# Patient Record
Sex: Male | Born: 1950
Health system: Southern US, Community
[De-identification: ages and names within clinical notes are randomized; demographics above are authoritative.]

## PROBLEM LIST (undated history)

## (undated) DIAGNOSIS — Z950 Presence of cardiac pacemaker: Secondary | ICD-10-CM

## (undated) DIAGNOSIS — K219 Gastro-esophageal reflux disease without esophagitis: Secondary | ICD-10-CM

## (undated) DIAGNOSIS — R55 Syncope and collapse: Secondary | ICD-10-CM

## (undated) DIAGNOSIS — R1310 Dysphagia, unspecified: Secondary | ICD-10-CM

## (undated) DIAGNOSIS — F32A Depression, unspecified: Secondary | ICD-10-CM

## (undated) DIAGNOSIS — S0990XA Unspecified injury of head, initial encounter: Secondary | ICD-10-CM

## (undated) DIAGNOSIS — T7840XA Allergy, unspecified, initial encounter: Secondary | ICD-10-CM

## (undated) DIAGNOSIS — I10 Essential (primary) hypertension: Secondary | ICD-10-CM

## (undated) DIAGNOSIS — M1990 Unspecified osteoarthritis, unspecified site: Secondary | ICD-10-CM

## (undated) DIAGNOSIS — I2510 Atherosclerotic heart disease of native coronary artery without angina pectoris: Secondary | ICD-10-CM

## (undated) DIAGNOSIS — Z45010 Encounter for checking and testing of cardiac pacemaker pulse generator [battery]: Secondary | ICD-10-CM

## (undated) DIAGNOSIS — F419 Anxiety disorder, unspecified: Secondary | ICD-10-CM

## (undated) DIAGNOSIS — N189 Chronic kidney disease, unspecified: Secondary | ICD-10-CM

## (undated) HISTORY — DX: Unspecified osteoarthritis, unspecified site: M19.90

## (undated) HISTORY — DX: Encounter for checking and testing of cardiac pacemaker pulse generator (battery): Z45.010

## (undated) HISTORY — DX: Gastro-esophageal reflux disease without esophagitis: K21.9

## (undated) HISTORY — DX: Anxiety disorder, unspecified: F41.9

## (undated) HISTORY — DX: Depression, unspecified: F32.A

## (undated) HISTORY — PX: SMALL INTESTINE SURGERY: SHX150

## (undated) HISTORY — PX: TOTAL SHOULDER ARTHROPLASTY: SHX126

## (undated) HISTORY — DX: Chronic kidney disease, unspecified: N18.9

## (undated) HISTORY — DX: Allergy, unspecified, initial encounter: T78.40XA

## (undated) HISTORY — PX: APPENDECTOMY: SHX54

## (undated) HISTORY — PX: EYE SURGERY: SHX253

## (undated) HISTORY — DX: Presence of cardiac pacemaker: Z95.0

## (undated) HISTORY — PX: JOINT REPLACEMENT: SHX530

## (undated) HISTORY — PX: ESOPHAGOGASTRODUODENOSCOPY: SHX1529

---

## 1998-06-27 ENCOUNTER — Inpatient Hospital Stay (HOSPITAL_COMMUNITY): Admission: EM | Admit: 1998-06-27 | Discharge: 1998-06-29 | Payer: Self-pay | Admitting: Emergency Medicine

## 1998-06-27 ENCOUNTER — Encounter: Payer: Self-pay | Admitting: Internal Medicine

## 1998-06-28 ENCOUNTER — Encounter: Payer: Self-pay | Admitting: Cardiology

## 1998-08-14 ENCOUNTER — Ambulatory Visit (HOSPITAL_COMMUNITY): Admission: RE | Admit: 1998-08-14 | Discharge: 1998-08-14 | Payer: Self-pay | Admitting: Gastroenterology

## 1998-09-03 ENCOUNTER — Ambulatory Visit (HOSPITAL_COMMUNITY): Admission: RE | Admit: 1998-09-03 | Discharge: 1998-09-03 | Payer: Self-pay | Admitting: Gastroenterology

## 1998-09-03 ENCOUNTER — Encounter: Payer: Self-pay | Admitting: Gastroenterology

## 2000-03-24 ENCOUNTER — Encounter: Admission: RE | Admit: 2000-03-24 | Discharge: 2000-06-22 | Payer: Self-pay | Admitting: Internal Medicine

## 2000-07-07 ENCOUNTER — Emergency Department (HOSPITAL_COMMUNITY): Admission: EM | Admit: 2000-07-07 | Discharge: 2000-07-07 | Payer: Self-pay | Admitting: Emergency Medicine

## 2000-12-14 ENCOUNTER — Encounter: Admission: RE | Admit: 2000-12-14 | Discharge: 2001-03-14 | Payer: Self-pay | Admitting: Internal Medicine

## 2001-01-22 ENCOUNTER — Emergency Department (HOSPITAL_COMMUNITY): Admission: EM | Admit: 2001-01-22 | Discharge: 2001-01-22 | Payer: Self-pay | Admitting: Emergency Medicine

## 2001-02-16 ENCOUNTER — Emergency Department (HOSPITAL_COMMUNITY): Admission: EM | Admit: 2001-02-16 | Discharge: 2001-02-16 | Payer: Self-pay | Admitting: Emergency Medicine

## 2001-02-27 ENCOUNTER — Emergency Department (HOSPITAL_COMMUNITY): Admission: EM | Admit: 2001-02-27 | Discharge: 2001-02-27 | Payer: Self-pay | Admitting: *Deleted

## 2001-06-09 ENCOUNTER — Emergency Department (HOSPITAL_COMMUNITY): Admission: EM | Admit: 2001-06-09 | Discharge: 2001-06-09 | Payer: Self-pay | Admitting: Emergency Medicine

## 2001-06-30 ENCOUNTER — Encounter: Admission: RE | Admit: 2001-06-30 | Discharge: 2001-09-28 | Payer: Self-pay | Admitting: Internal Medicine

## 2002-02-09 ENCOUNTER — Encounter: Admission: RE | Admit: 2002-02-09 | Discharge: 2002-05-10 | Payer: Self-pay | Admitting: Internal Medicine

## 2002-08-07 ENCOUNTER — Emergency Department (HOSPITAL_COMMUNITY): Admission: EM | Admit: 2002-08-07 | Discharge: 2002-08-07 | Payer: Self-pay | Admitting: Emergency Medicine

## 2002-08-19 ENCOUNTER — Emergency Department (HOSPITAL_COMMUNITY): Admission: EM | Admit: 2002-08-19 | Discharge: 2002-08-19 | Payer: Self-pay | Admitting: Plastic Surgery

## 2002-08-19 ENCOUNTER — Encounter: Payer: Self-pay | Admitting: Emergency Medicine

## 2002-09-14 ENCOUNTER — Encounter: Payer: Self-pay | Admitting: Internal Medicine

## 2002-09-14 ENCOUNTER — Ambulatory Visit (HOSPITAL_COMMUNITY): Admission: RE | Admit: 2002-09-14 | Discharge: 2002-09-14 | Payer: Self-pay | Admitting: Internal Medicine

## 2002-09-14 ENCOUNTER — Emergency Department (HOSPITAL_COMMUNITY): Admission: EM | Admit: 2002-09-14 | Discharge: 2002-09-14 | Payer: Self-pay | Admitting: Emergency Medicine

## 2002-12-04 ENCOUNTER — Emergency Department (HOSPITAL_COMMUNITY): Admission: EM | Admit: 2002-12-04 | Discharge: 2002-12-04 | Payer: Self-pay | Admitting: *Deleted

## 2002-12-07 ENCOUNTER — Emergency Department (HOSPITAL_COMMUNITY): Admission: EM | Admit: 2002-12-07 | Discharge: 2002-12-07 | Payer: Self-pay | Admitting: *Deleted

## 2002-12-25 ENCOUNTER — Ambulatory Visit (HOSPITAL_COMMUNITY): Admission: RE | Admit: 2002-12-25 | Discharge: 2002-12-25 | Payer: Self-pay | Admitting: *Deleted

## 2003-01-18 ENCOUNTER — Ambulatory Visit (HOSPITAL_COMMUNITY): Admission: RE | Admit: 2003-01-18 | Discharge: 2003-01-18 | Payer: Self-pay | Admitting: Cardiovascular Disease

## 2003-01-27 HISTORY — PX: PACEMAKER INSERTION: SHX728

## 2003-01-27 HISTORY — PX: OTHER SURGICAL HISTORY: SHX169

## 2003-01-30 ENCOUNTER — Inpatient Hospital Stay (HOSPITAL_COMMUNITY): Admission: EM | Admit: 2003-01-30 | Discharge: 2003-02-02 | Payer: Self-pay | Admitting: Emergency Medicine

## 2003-02-01 DIAGNOSIS — Z950 Presence of cardiac pacemaker: Secondary | ICD-10-CM

## 2003-02-01 HISTORY — DX: Presence of cardiac pacemaker: Z95.0

## 2003-03-10 ENCOUNTER — Emergency Department (HOSPITAL_COMMUNITY): Admission: EM | Admit: 2003-03-10 | Discharge: 2003-03-10 | Payer: Self-pay | Admitting: *Deleted

## 2003-03-24 ENCOUNTER — Emergency Department (HOSPITAL_COMMUNITY): Admission: EM | Admit: 2003-03-24 | Discharge: 2003-03-24 | Payer: Self-pay | Admitting: Emergency Medicine

## 2003-05-20 ENCOUNTER — Emergency Department (HOSPITAL_COMMUNITY): Admission: EM | Admit: 2003-05-20 | Discharge: 2003-05-20 | Payer: Self-pay | Admitting: Emergency Medicine

## 2003-07-24 ENCOUNTER — Emergency Department (HOSPITAL_COMMUNITY): Admission: EM | Admit: 2003-07-24 | Discharge: 2003-07-24 | Payer: Self-pay | Admitting: Emergency Medicine

## 2003-08-24 ENCOUNTER — Emergency Department (HOSPITAL_COMMUNITY): Admission: EM | Admit: 2003-08-24 | Discharge: 2003-08-24 | Payer: Self-pay | Admitting: Emergency Medicine

## 2003-09-17 ENCOUNTER — Emergency Department (HOSPITAL_COMMUNITY): Admission: EM | Admit: 2003-09-17 | Discharge: 2003-09-17 | Payer: Self-pay | Admitting: Emergency Medicine

## 2004-02-14 ENCOUNTER — Ambulatory Visit (HOSPITAL_COMMUNITY): Admission: RE | Admit: 2004-02-14 | Discharge: 2004-02-14 | Payer: Self-pay | Admitting: Gastroenterology

## 2004-03-03 ENCOUNTER — Emergency Department (HOSPITAL_COMMUNITY): Admission: EM | Admit: 2004-03-03 | Discharge: 2004-03-04 | Payer: Self-pay | Admitting: Emergency Medicine

## 2004-04-11 ENCOUNTER — Ambulatory Visit: Payer: Self-pay | Admitting: Hematology & Oncology

## 2004-06-06 ENCOUNTER — Emergency Department (HOSPITAL_COMMUNITY): Admission: EM | Admit: 2004-06-06 | Discharge: 2004-06-06 | Payer: Self-pay | Admitting: Emergency Medicine

## 2004-07-18 ENCOUNTER — Emergency Department (HOSPITAL_COMMUNITY): Admission: EM | Admit: 2004-07-18 | Discharge: 2004-07-18 | Payer: Self-pay | Admitting: Emergency Medicine

## 2004-08-02 ENCOUNTER — Emergency Department (HOSPITAL_COMMUNITY): Admission: EM | Admit: 2004-08-02 | Discharge: 2004-08-02 | Payer: Self-pay | Admitting: Emergency Medicine

## 2005-02-25 ENCOUNTER — Encounter: Admission: RE | Admit: 2005-02-25 | Discharge: 2005-02-25 | Payer: Self-pay | Admitting: Internal Medicine

## 2005-03-28 ENCOUNTER — Emergency Department (HOSPITAL_COMMUNITY): Admission: EM | Admit: 2005-03-28 | Discharge: 2005-03-28 | Payer: Self-pay | Admitting: Emergency Medicine

## 2006-12-14 ENCOUNTER — Emergency Department (HOSPITAL_COMMUNITY): Admission: EM | Admit: 2006-12-14 | Discharge: 2006-12-14 | Payer: Self-pay | Admitting: Emergency Medicine

## 2007-03-02 ENCOUNTER — Ambulatory Visit (HOSPITAL_COMMUNITY): Admission: RE | Admit: 2007-03-02 | Discharge: 2007-03-02 | Payer: Self-pay | Admitting: Neurosurgery

## 2007-10-01 ENCOUNTER — Emergency Department (HOSPITAL_COMMUNITY): Admission: EM | Admit: 2007-10-01 | Discharge: 2007-10-01 | Payer: Self-pay | Admitting: Emergency Medicine

## 2007-10-03 ENCOUNTER — Emergency Department (HOSPITAL_COMMUNITY): Admission: EM | Admit: 2007-10-03 | Discharge: 2007-10-03 | Payer: Self-pay | Admitting: Emergency Medicine

## 2007-12-18 ENCOUNTER — Emergency Department (HOSPITAL_COMMUNITY): Admission: EM | Admit: 2007-12-18 | Discharge: 2007-12-18 | Payer: Self-pay | Admitting: Emergency Medicine

## 2007-12-28 ENCOUNTER — Emergency Department (HOSPITAL_COMMUNITY): Admission: EM | Admit: 2007-12-28 | Discharge: 2007-12-28 | Payer: Self-pay | Admitting: Emergency Medicine

## 2008-02-07 ENCOUNTER — Emergency Department (HOSPITAL_COMMUNITY): Admission: EM | Admit: 2008-02-07 | Discharge: 2008-02-07 | Payer: Self-pay | Admitting: Emergency Medicine

## 2008-10-15 ENCOUNTER — Ambulatory Visit: Payer: Self-pay | Admitting: Psychology

## 2008-12-13 ENCOUNTER — Ambulatory Visit: Payer: Self-pay | Admitting: Psychology

## 2009-08-16 ENCOUNTER — Encounter: Admission: RE | Admit: 2009-08-16 | Discharge: 2009-08-16 | Payer: Self-pay | Admitting: Orthopedic Surgery

## 2009-08-27 ENCOUNTER — Inpatient Hospital Stay (HOSPITAL_COMMUNITY): Admission: RE | Admit: 2009-08-27 | Discharge: 2009-08-29 | Payer: Self-pay | Admitting: Orthopedic Surgery

## 2010-01-23 ENCOUNTER — Encounter
Admission: RE | Admit: 2010-01-23 | Discharge: 2010-01-23 | Payer: Self-pay | Source: Home / Self Care | Attending: Orthopedic Surgery | Admitting: Orthopedic Surgery

## 2010-02-16 ENCOUNTER — Encounter: Payer: Self-pay | Admitting: Neurosurgery

## 2010-04-08 ENCOUNTER — Ambulatory Visit (HOSPITAL_COMMUNITY): Admission: RE | Admit: 2010-04-08 | Payer: Medicare Other | Source: Ambulatory Visit | Admitting: Orthopedic Surgery

## 2010-04-11 LAB — CBC
HCT: 28.7 % — ABNORMAL LOW (ref 39.0–52.0)
MCV: 90.2 fL (ref 78.0–100.0)
Platelets: 135 10*3/uL — ABNORMAL LOW (ref 150–400)
RBC: 3.28 MIL/uL — ABNORMAL LOW (ref 4.22–5.81)
RBC: 3.86 MIL/uL — ABNORMAL LOW (ref 4.22–5.81)
RDW: 13.8 % (ref 11.5–15.5)
RDW: 14 % (ref 11.5–15.5)
WBC: 10.7 10*3/uL — ABNORMAL HIGH (ref 4.0–10.5)
WBC: 9.9 10*3/uL (ref 4.0–10.5)

## 2010-04-11 LAB — BASIC METABOLIC PANEL
BUN: 16 mg/dL (ref 6–23)
BUN: 24 mg/dL — ABNORMAL HIGH (ref 6–23)
Calcium: 8.1 mg/dL — ABNORMAL LOW (ref 8.4–10.5)
Creatinine, Ser: 1.72 mg/dL — ABNORMAL HIGH (ref 0.4–1.5)
GFR calc Af Amer: 49 mL/min — ABNORMAL LOW (ref >60)
GFR calc Af Amer: >60 mL/min (ref >60)
GFR calc non Af Amer: 41 mL/min — ABNORMAL LOW (ref >60)
GFR calc non Af Amer: >60 mL/min (ref >60)
Potassium: 3.8 mEq/L (ref 3.5–5.1)
Sodium: 137 mEq/L (ref 135–145)

## 2010-04-12 LAB — DIFFERENTIAL
Basophils Absolute: 0.1 10*3/uL (ref 0.0–0.1)
Lymphocytes Relative: 21 % (ref 12–46)
Monocytes Absolute: 0.5 10*3/uL (ref 0.1–1.0)
Neutro Abs: 3.7 10*3/uL (ref 1.7–7.7)

## 2010-04-12 LAB — SURGICAL PCR SCREEN
MRSA, PCR: NEGATIVE
Staphylococcus aureus: NEGATIVE

## 2010-04-12 LAB — BASIC METABOLIC PANEL
BUN: 14 mg/dL (ref 6–23)
GFR calc non Af Amer: >60 mL/min (ref >60)
Potassium: 4.4 mEq/L (ref 3.5–5.1)

## 2010-04-12 LAB — URINE MICROSCOPIC-ADD ON

## 2010-04-12 LAB — URINALYSIS, ROUTINE W REFLEX MICROSCOPIC
Bilirubin Urine: NEGATIVE
Hgb urine dipstick: NEGATIVE
Ketones, ur: NEGATIVE mg/dL
Protein, ur: NEGATIVE mg/dL
Specific Gravity, Urine: 1.017 (ref 1.005–1.030)
Urobilinogen, UA: 0.2 mg/dL (ref 0.0–1.0)

## 2010-04-12 LAB — CBC
HCT: 44.7 % (ref 39.0–52.0)
Platelets: 164 10*3/uL (ref 150–400)
RDW: 14.1 % (ref 11.5–15.5)
WBC: 5.5 10*3/uL (ref 4.0–10.5)

## 2010-04-12 LAB — TYPE AND SCREEN: ABO/RH(D): O POS

## 2010-04-12 LAB — PROTIME-INR
INR: 0.89 (ref 0.00–1.49)
Prothrombin Time: 12 seconds (ref 11.6–15.2)

## 2010-04-12 LAB — APTT: aPTT: 45 seconds — ABNORMAL HIGH (ref 24–37)

## 2010-05-12 LAB — URINALYSIS, ROUTINE W REFLEX MICROSCOPIC
Bilirubin Urine: NEGATIVE
Glucose, UA: NEGATIVE mg/dL
Ketones, ur: NEGATIVE mg/dL
pH: 6 (ref 5.0–8.0)

## 2010-05-12 LAB — DIFFERENTIAL
Basophils Relative: 1 % (ref 0–1)
Monocytes Relative: 7 % (ref 3–12)
Neutro Abs: 5.2 10*3/uL (ref 1.7–7.7)
Neutrophils Relative %: 74 % (ref 43–77)

## 2010-05-12 LAB — COMPREHENSIVE METABOLIC PANEL
Alkaline Phosphatase: 81 U/L (ref 39–117)
BUN: 7 mg/dL (ref 6–23)
CO2: 29 mEq/L (ref 19–32)
Calcium: 9.3 mg/dL (ref 8.4–10.5)
GFR calc non Af Amer: >60 mL/min (ref >60)
Glucose, Bld: 98 mg/dL (ref 70–99)
Potassium: 3.5 mEq/L (ref 3.5–5.1)
Total Protein: 6.9 g/dL (ref 6.0–8.3)

## 2010-05-12 LAB — CBC
HCT: 46.8 % (ref 39.0–52.0)
Hemoglobin: 15.4 g/dL (ref 13.0–17.0)
MCHC: 32.8 g/dL (ref 30.0–36.0)
RDW: 14.1 % (ref 11.5–15.5)

## 2010-05-12 LAB — LIPASE, BLOOD: Lipase: 21 U/L (ref 11–59)

## 2010-05-12 LAB — RPR: RPR Ser Ql: NONREACTIVE

## 2010-06-13 NOTE — Op Note (Signed)
NAMEBRADON, FESTER                        ACCOUNT NO.:  [id number]   MEDICAL RECORD NO.:  [id number]                   PATIENT TYPE:  OIB   LOCATION:  2860                                 FACILITY:  [facility]   PHYSICIAN:  Richard A. Alanda Amass, M.D.          DATE OF BIRTH:  [birth year]   DATE OF PROCEDURE:  01/18/2003  DATE OF DISCHARGE:  01/18/2003                                 OPERATIVE REPORT   PREOPERATIVE DIAGNOSIS:  1. Recurrent syncope, etiology unknown, negative neurologic evaluation,     negative tilt table testing December 25, 2002, negative Holter monitor.  2. Occupational head injury 1998, subsequent migraine headaches.  3. Remote seizure disorder, off medication now (Depakote).  4. Systemic hypertension, left ventricular hypertrophy.   POSTOPERATIVE DIAGNOSIS:  1. Recurrent syncope, etiology unknown, negative neurologic evaluation,     negative tilt table testing December 25, 2002, negative Holter monitor.  2. Occupational head injury 1998, subsequent migraine headaches.  3. Remote seizure disorder, off medication now (Depakote).  4. Systemic hypertension, left ventricular hypertrophy.   PROCEDURE:  Implantation of Medtronic loop recorder, Reveal-Plus model  [id number]; serial number [id number].   PREMEDICATION:  5 mg Valium p.o. premedication, 2 mg of morphine  premedication, 1 gm of Ancef IV antibiotic prophylaxis, 2 mg of Versed and 2  mg of morphine IV, 1% local Xylocaine.   ESTIMATED BLOOD LOSS:  Approximately 10 mL.   COMPLICATIONS:  None.   CURRENT MEDICATIONS:  1. Effexor 150.  2. Risperdal 2 mg.  3. Ultracet 2 q.6h. 37.5/325.  4. Actiq 600 micrograms p.r.n.  5. Lotrel 5/10 2 every day.  6. Atenolol 50.  7. Clonidine 0.2.  8. Clonazepam 1 mg every day.  9. Amitriptyline 25.  10.      Duragesic patch 2 mg every day.  11.      Baby aspirin 1 every day.   INDICATIONS FOR PROCEDURE:  He was referred for loop recorder implantation  because of unexplained  syncope with history as outlined above. He was  brought in as an outpatient, coags and preoperative laboratory  were normal.   DESCRIPTION OF PROCEDURE:  He was brought to the 6th floor CP laboratory,  room #6. The left anterior chest was prepped and draped in the usual manner  and 1% Xylocaine was used for local anesthesia. He was given intermittent  morphine  and Versed for sedation.   The mid left chest, 3rd to 4th intercostal space, parasternal, a short  transverse incision was performed. This was brought down through the  subcutaneous tissue. A pocket was formed using blunt dissection and  electrocautery to control hemostasis. The Reveal loop recorder was inserted  into the pocket and tacked down two #1 silk sutures in the superior aspect  just below the incision. The recorder fit well with good subcutaneous  tissue.   The sponge count was correct. The subcutaneous tissue was closed with 2  short running layers of  2-0 Dexon suture. The skin was closed with 5-0  subcuticular Dexon suture  and Steri-Strips were applied.   The patient tolerated the procedure well. He was transferred to the holding  area for  programming of his Reveal Plus programmable loop recorder and we  plan to discharge  the patient later today  if stable.                                               Richard A. Alanda Amass, M.D.    RAW/MEDQ  D:  01/18/2003  T:  01/20/2003  Job:  [id number]   cc:   Merlene Laughter. [name], M.D.  [address]  [address]  [place]  [place] [address]  Fax: [phone]   CP Lab

## 2010-06-13 NOTE — Op Note (Signed)
NAMEMARDELL, Aaron                        ACCOUNT NO.:  [id number]   MEDICAL RECORD NO.:  [id number]                   PATIENT TYPE:  INP   LOCATION:  2011                                 FACILITY:  [facility]   PHYSICIAN:  Richard A. Alanda Amass, M.D.          DATE OF BIRTH:  [birth year]   DATE OF PROCEDURE:  DATE OF DISCHARGE:                                 OPERATIVE REPORT   PROCEDURE:  1. Explantation of Reveal Plus Medtronic [id number] implantable loop recorder SN:     [id number].  2. Implantation of new DDDR Medtronic pulse generator Enpulse model #[id number];     SN:  [id number] with passive fixation __________ atrial and ventricular     bipolar Medtronic steroid eluding electrodes.   IMPLANTING PHYSICIAN:  Richard A. Alanda Amass, M.D.   COMPLICATIONS:  None.   ESTIMATED BLOOD LOSS:  Approximately 50 mL.   ANESTHESIA:  5 mg of Valium p.o. premedication, intermittent morphine  sulfate total of 6 mg IV, Zofran 4 mg IV.   PREOPERATIVE DIAGNOSES:  1. Recurrent syncope and presyncope.  2. Documented 7-9 second pauses on loop recorder and telemetry, sinus node     dysfunction.  3. Severe hypertension medically resistant.  4. Normal coronary arteries and left ventricle on prior catheterization     February 01, 2003.  5. Occupational head injury in 1998, subsequent migraine headaches, remote     seizures, off seizure medication now.  6. Recent negative neurologic workup.  7. Mild fasting glucose elevation, possible diabetes.   ATRIAL ELECTRODE:  Medtronic [id number]; SN:  [id number].   VENTRICULAR ELECTRODE:  Medtronic [id number]; SN:  [id number].   THRESHOLDS:  Atrial:  Unipolar:  Threshold for capture 0.2V,  resistance 382  ohms,  P wave measured 5.0 MV.   Bipolar atrial:  Threshold for capture 0.8V, resistance 499 ohms, P wave  measured 4.1 MV.   Ventricular unipolar:  Threshold 0.1V, impedance 458 ohms, R wave 8.3 MV.   Bipolar ventricular:  Threshold 0.3V, resistance 610 ohms, R  wave 9.0 MV.   DESCRIPTION OF PROCEDURE:  The patient was brought to the second floor CP  lab in the post absorptive state. He had previous coronary angiography and  temporary transvenous pacemaker was placed at that time along with an  arterial sheath to monitor blood pressures.  Because of significant  hypertension, he was placed on IV labetalol drip after getting a total of 30  mg IV labetalol x3.  It required labetalol drip of 2 mg per minute and IV  nitroglycerin of 5 mcg per minute for blood pressure control during the  procedure.  He had some mild nausea which was treated with 4 mg of Zofran  IV.  He had a total of 6 mg of morphine sulfate in divided doses during the  procedure for anesthesia.   The right anterior chest was prepped and draped in the usual manner, 1%  Xylocaine was used for local anesthesia.  A right infraclavicular  curvilinear transverse incision was performed and brought down through the  firm subcutaneous tissue to the prepectoral fascia. A post generator pocket  was formed using blunt dissection, electrocautery was used to control  hemostasis.  The patient had temporary pacing going which was intermittently  packing in backup.   The subclavian vein was entered with a single anterior puncture using 18  thin walled needle and two tandem #9 peel-away Cook introducers were  introduced with retained guidewire in tandem and the electrodes inserted per  standard fashion. Ventricular electrode was positioned below the tricuspid  valve in the right ventricle and was stable. There was some ectopy on  positioning the RV electrode and this was the quietest position and had  excellent thresholds and R waves.  Atrial electrode was positioned in the  right atrial appendage confirmed by fluoroscopy and rotational maneuvers.  Unipolar and bipolar threshold testing was done, there was no diaphragmatic  stimulation on the atrial or ventricular leads with 10 volt output.  The   electrodes were secured at the insertion site with the previously placed #1  figure-of-eight silk suture after the retained guidewire was removed.  This  was to prevent migration and control hemostasis.  They were further secured  with two interrupted #1 silk sutures around a silicon collar for each  electrode.  The pocket was irrigated with 500 mg of kanamycin solution.  The  generator was delivered into the pocket with the electrodes looped behind,  the sponge count was correct. The generator was loosely secured to the  underlying muscle and fascia with #1 silk suture to prevent migration.  The  subcutaneous tissue was closed with two separate running layers of 2-0 Dexon  suture and the skin was closed with 5-0 subcuticular Dexon suture and Steri-  Strips were applied.  We then turned our attention to the patient's  previously implant loop recorder in the left parasternal region. This area  had been previously prepped and draped accordingly.  One percent Xylocaine  was used for local anesthesia.  A short transverse 1 1/2 to 2 cm incision  was performed just below the previous incision at the superior aspect of the  loop recorder. Blunt dissection was used to open the subcutaneous tissue.  The two previously placed sutures were removed in total and the loop  recorder was delivered from the pocket. The pocket was irrigated with  sterile saline solution and hemostasis was controlled with electrocautery.  The pocket was primarily closed with two short separate running layers of 2-  0 Dexon suture for the subcutaneous tissue plane and the skin was closed  with 5-0 subcuticular Dexon suture and Steri-Strips were applied. The  patient tolerated this quite well.  At the end of the procedure, he was on 2  mg a minute of labetalol and IV nitroglycerin was discontinued.  His systolic blood pressure was 140 and 150 mmHg.  He tolerated the procedure  well and was transferred to the holding area for  postoperative care and  reprogramming.   PREOPERATIVE DIAGNOSES:  1. Recurrent syncope with documented sinus arrest and sinus pauses 7-9     seconds on telemetry and loop recorder monitoring.  2. Previously placed loop recorder January 18, 2003.  3. Systemic hypertension medically resistant.  4. Normal renal arteries.  5. Normal coronary arteries and left ventricle February 01, 2003.  6. Remote occupational head injury, subsequent seizures, none recent.     History of chronic migraine headaches.  Magnet rate of BOL equals 85, at ERA magnet rate drops down to 65 per  minute.                                               Richard A. Alanda Amass, M.D.    RAW/MEDQ  D:  02/01/2003  T:  02/01/2003  Job:  [id number]   cc:   Nanetta Batty, M.D.  Fax: [phone]   Merlene Laughter. [name], M.D.  [address]  [address]  [place]  [place] [address]  Fax: [phone]

## 2010-06-13 NOTE — Cardiovascular Report (Signed)
Aaron Snyder, Aaron Snyder                        ACCOUNT NO.:  [id number]   MEDICAL RECORD NO.:  [id number]                   PATIENT TYPE:  INP   LOCATION:  2011                                 FACILITY:  [facility]   PHYSICIAN:  Richard A. Alanda Amass, M.D.          DATE OF BIRTH:  November 15, 1950   DATE OF PROCEDURE:  02/01/2003  DATE OF DISCHARGE:                              CARDIAC CATHETERIZATION   PROCEDURE:  1. Retrograde central aortic catheterization.  2. Coronary angiography by Judkins technique.  3. Left ventricular angiogram RAO projection.  4. Abdominal aortic angiogram mid stream PA projection.   DESCRIPTION OF PROCEDURE:  The patient was brought to the second floor CP  Laboratory in the postabsorptive state after 5 mg Valium p.o. premedication.  Because of the history of possible dye allergy he was premedicated  preoperatively with 25 IV Benadryl, 125 IV Solu-Medrol, and 20 mg IV Pepcid  preoperatively.  He had no dye reactions at all during the procedure.  The  patient's CFA and the CFE were both entered with anterior punctures using an  18 thin wall needle and 6-French venous and arterial sheaths were inserted  without difficulty.  Temporary transvenous pacemaker was inserted because of  the patient's history of syncope with sinus arrest and seven to nine second  recurrent pauses.  Temporary transvenous pacemaker was passed to the RV and  temporary transvenous pacing was put on back-up.  The patient remained in  sinus rhythm throughout the procedure.  Diagnostic coronary angiography was  done with a 6-French 4 cm taper, preformed Cordis coronary, and pigtail  catheters using Omnipaque dye throughout the procedure.  The patient was  given 2 mg of morphine during the procedure for sedation.  LV angiogram in  the RAO projection with 25 mL 14 mL/second.  Pullback pressure of the CA  showed no gradient across the aortic valve.  Abdominal aortic angiogram done  above the level of the  renal arteries at 25 mL 20 mL/second with  visualization of the distal iliacs and SFA profunda junction bilaterally.  Catheters removed.  Sidearm sheath was flushed.  The patient tolerated the  diagnostic procedure well.   PRESSURES:  LV:  195/0.  LVEDP 18 mmHg.  CA:  195/100, mean 138 mmHg.  There  was no gradient across the aortic valve on catheter pullback.  Fluoroscopy  showed the previously placed left parasternal loop recorder in position.  There was no significant coronary, intracardiac, or valvular calcification.   LEFT VENTRICULOGRAM:  LV angiogram demonstrated the normally contracting  left ventricle with angiographic LVH, EF greater than 55% with no MR  present.   ANGIOGRAPHIC DATA:  The main left coronary was long and normal.   The LAD was mildly tortuous, coursed to the apex of the heart where it  bifurcated and was normal throughout its course.   There was normal small to moderate DX1 before SP1 and a normal DX2 after SP1  to junction of the proximal third of the LAD.  There was small diagonals  arising from the mid and distal LAD that were normal.   The circumflex artery was widely patent and smooth.  However, there was  tortuosity and mild systolic approximately 30% narrowing at the junction of  the proximal third with systole.  There was good flow throughout.  The  circumflex bifurcated distally.  There was a large PABG branch and large  distal bifurcating marginal branch.   The right coronary artery had smooth 30% mild eccentric narrowing at the  junction of the proximal third.  The large RV branch in the mid portion  bifurcated and was normal.  The PDA and PLA were normal with the PDA arising  near the acute margin.   DISCUSSION:  This 60 year old widowed father of two with two grandchildren  is currently disabled.  His daughter lives with this.  He quit smoking in  1998.  Has no drug abuse and has a history of significant medically  resistant hypertension and  recurrent syncope.   The patient had an occupational head injury in 1998, subsequent migraine  headaches and remote seizure disorder, off medication at present.  Because  of recurrent frank syncopal along with presyncopal episodes with negative  neurologic evaluation including CT, MRI, and EEG, he underwent loop recorder  implantation on January 18, 2003 in the left parasternal position which was  uncomplicated.   He has had recurrent syncope and was seen back in the office on January 4,  60 2005 and found to have a long sinus pause of up to seven seconds along with  intermittent sinus arrest for three to four seconds.  His medications were  held including beta blockers.  He was admitted to the hospital and placed on  telemetry and he had a recurrent long sinus pause of approximately seven  seconds with presyncope.  He was referred for further evaluation and  consideration for pacemaker therapy.  He has a history of hypertension,  atypical chest pain, and had remote normal coronary arteries.   Coronary arteries show normal LV.  Systemic hypertension.  Abdominal aortic  angiogram is normal with normal single renal arteries bilaterally and normal  SMA, celiac, IMA vessels proximally, and normal iliacs.   There is no significant right coronary disease or AV node arterial supply to  suggest an ischemic etiology to his syncope.  It is recommended that the  patient undergo permanent pacemaker implantation in this setting and  reinstitution of medical therapy for his hypertension.   CATHETERIZATION DIAGNOSES:  1. Essentially normal coronary arteries and left ventricle.  2. Systemic hypertension, poorly controlled.  Medications on hold at present     because of recurrent syncope.  3. Normal renal arteries.  4. Angiographic left ventricular hypertrophy with normal systolic function.  5. Recurrent syncope and presyncope with documented sinus pauses. 6. Remote occupational head injury,  subsequent seizures and chronic migraine     headaches treated medically.  7. History of dye allergy.  No problems with premedication on this study.                                               Richard A. Alanda Amass, M.D.    RAW/MEDQ  D:  02/01/2003  T:  02/01/2003  Job:  [id number]   cc:   CP Lab  Merlene Laughter. [name], M.D.  [address]  [address]  [place]  [place] [address]  Fax: [phone]

## 2010-06-13 NOTE — Discharge Summary (Signed)
[name], [name]                        ACCOUNT NO.:  [id number]   MEDICAL RECORD NO.:  [id number]                   PATIENT TYPE:  INP   LOCATION:  2011                                 FACILITY:  [facility]   PHYSICIAN:  Richard A. Alanda Amass, M.D.          DATE OF BIRTH:  06-13-1950   DATE OF ADMISSION:  01/30/2003  DATE OF DISCHARGE:  02/02/2003                                 DISCHARGE SUMMARY   [name] is a 60 year old patient of Dr. Nanetta Batty and Dr.  Susa Griffins who was admitted January 30, 2003, secondary to syncope and  near syncope.  He had a prior history of this and actually had a loop  recorder put in.  He came to the office on January 30, 2003, with complaints  of syncope on New Years and feeling presyncopal on the day of admission.  His interrogation revealed significant bradycardia with long pauses and some  junctional rhythms.  Thus he was admitted, some of his medications were held  such as his atenolol and clonidine.  That day he had a 9.7 second pause on  the monitor and he momentarily passed out.  He apparently continued to have  pauses while hospitalized.  Thus it was decided that he should undergo  permanent pacemaker implantation.  This was performed on February 01, 2003, by  Dr. Susa Griffins.  His Reveal Plus loop recorder was explanted and then  he had a dual-chamber pacemaker implanted.  It was a Medtronic EnPulse,  serial number [id number].  It was placed in a DDDR mode.  He also underwent  cardiac catheterization.  He was found to have only a 30% blockage of his  circumflex and 30% of his RCA.  His LAD and diagonals were clean.  He was  very hypertensive during his procedures, thus he was put on labetalol drip.  This was discontinued that same day and he was placed back on atenolol.  The  day of discharge his blood pressures ranged from 96/50 to 152/65, his heart  rate was in the 60s to 70s, and O2 saturations were 93-96%.  Labs:   Sodium  135, potassium 4.2, BUN 14, creatinine 1.2, glucose 203.  His hemoglobin was  12.0, hematocrit 35.6, WBC 14.6, and platelets 218.  His discharge today is  pending his chest x-ray reports and him receiving his antibiotics post-  implantation.   Medications at discharge are:  1. Effexor XR 150 mg once per day.  2. Klonopin 1 mg once per day.  3. Norvasc 10 mg once per day.  4. Lotrel 10/20 once per day.  5. Elavil 75 mg at bedtime.  6. Risperdal 2 mg once per day.  7. Atenolol 50 mg twice per day.  8. Enteric-coated aspirin 81 mg once per day.  9. Fentanyl patch 50 mcg, he changes that every two days.   Activity is no lifting, pushing or pulling with right arm.  No  driving until  seen in the office.  No stretching with his right arm.   He should be on a low-salt 2000-mg or less diet, low saturated fat.  He  should eat lots of vegetables, fruits, and whole grains.   He should look at his pacer site every day.  If it looks like it is swelling  a lot, he will give [place] a call.  If the bruising becomes extreme or if he  sees any drainage, he will give [place] a call.  He is to keep his pacer site dry  x4 days and he should was with soap and water gently and pat dry.  He will  follow up with Corine Shelter, P.A., on February 12, 2003, at 10:30.  He will  then be scheduled to see Dr. Alanda Amass some time in February.   DISCHARGE DIAGNOSES:  1. Syncope secondary to long pauses, also had some junctional rhythm.  2. Status post __________ placed with a DDDR Medtronic pacer.  3. Hypertension.  4. History of head injury.  5. Chronic pain.  6. Very mild coronary artery disease.  7. Intermittent elevated glucose while in the hospital.      [name], N.P.                        Richard A. Alanda Amass, M.D.    BB/MEDQ  D:  02/02/2003  T:  02/03/2003  Job:  [id number]   cc:   Merlene Laughter. [name], M.D.  [address]  [address]  [place]  [place] [address]  Fax: [phone]   Nanetta Batty,  M.D.  Fax: [phone]

## 2010-06-13 NOTE — Cardiovascular Report (Signed)
NAMEAIMEE, Aaron Snyder                        ACCOUNT NO.:  [id number]   MEDICAL RECORD NO.:  [id number]                   PATIENT TYPE:  OIB   LOCATION:  2891                                 FACILITY:  [facility]   PHYSICIAN:  Darlin Priestly, M.D.             DATE OF BIRTH:  19-Sep-1950   DATE OF PROCEDURE:  12/25/2002  DATE OF DISCHARGE:                              CARDIAC CATHETERIZATION   PROCEDURES PERFORMED:  Heads up tilt table testing with isopril infusion.   ATTENDING:  Darlin Priestly, M.D.   COMPLICATIONS:  None.   INDICATIONS:  Mr. [name] is a 60 year old male patient of Cala Bradford R.  [name], M.D. and Nanetta Batty, M.D. with a history of a head injury in  1998, history of remote tobacco use, hypertension who recently has had  recurrent unexplained syncope with a negative neurologic evaluation.  He is  now referred for tilt table testing to rule out neurocardiogenic syncope.   DESCRIPTION OF OPERATION:  After giving informed written consent, patient  brought to the cardiac catheterization laboratory in a fasting state.  The  patient was then placed in a supine position and hemodynamic measurements  were obtained.  Resting blood pressure 177/90.  Resting heart rate 75.  He  was monitored for approximately five minutes, then tilted to the 70 degree  heads up position for approximately 30 minutes.  He did have some  intermittent palpitations, but no significant change in his heart rate or  blood pressure.  He was returned to the supine position after 30 minutes and  was begun on isopril infusion initially at 7.5 and ultimately titrated to  37.5 mL.  He did have a peak heart rate of approximately 126, but no  significant change in his blood pressure.  After approximately 10 minutes he  was then tilted to a 70 degree heads up position again for 10 minutes and  though he complained of some mild room spinning, he had no syncope or  presyncope.  Isopril was then discontinued.   He was returned to supine  position.  Blood pressure was again monitored approximately 10 minutes.  He  remained completely asymptomatic.  He was then transferred to recovery room  in stable condition.   CONCLUSION:  Negative heads up tilt table testing with isopril infusion.                                               Darlin Priestly, M.D.    RHM/MEDQ  D:  12/25/2002  T:  12/25/2002  Job:  [id number]   cc:   Merlene Laughter. [name], M.D.  [address]  [address]  [place]  [place] [address]  Fax: [phone]   Nanetta Batty, M.D.  Fax: [phone]

## 2010-10-28 LAB — CBC
HCT: 47
Hemoglobin: 15.7
MCHC: 33.4
MCV: 90.4
Platelets: 217
RDW: 14

## 2010-10-28 LAB — URINALYSIS, ROUTINE W REFLEX MICROSCOPIC
Bilirubin Urine: NEGATIVE
Glucose, UA: NEGATIVE
Hgb urine dipstick: NEGATIVE
Ketones, ur: NEGATIVE
Protein, ur: NEGATIVE
Urobilinogen, UA: 0.2

## 2010-10-28 LAB — DIFFERENTIAL
Basophils Absolute: 0
Basophils Relative: 1
Lymphocytes Relative: 23
Monocytes Absolute: 0.4
Monocytes Relative: 8
Neutro Abs: 3.4
Neutrophils Relative %: 66

## 2010-10-28 LAB — COMPREHENSIVE METABOLIC PANEL
Albumin: 3.8
BUN: 13
Creatinine, Ser: 0.94
Glucose, Bld: 114 — ABNORMAL HIGH
Total Protein: 6.7

## 2010-10-28 LAB — GLUCOSE, CAPILLARY: Glucose-Capillary: 107 — ABNORMAL HIGH

## 2010-10-28 LAB — OCCULT BLOOD X 1 CARD TO LAB, STOOL: Fecal Occult Bld: NEGATIVE

## 2010-10-29 LAB — DIFFERENTIAL
Basophils Absolute: 0
Basophils Relative: 1
Eosinophils Absolute: 0.1
Eosinophils Relative: 1
Eosinophils Relative: 1
Lymphocytes Relative: 16
Lymphocytes Relative: 19
Lymphs Abs: 1.2
Monocytes Absolute: 0.6
Monocytes Absolute: 0.6
Monocytes Relative: 8
Monocytes Relative: 9
Neutro Abs: 4.3
Neutro Abs: 5.4
Neutrophils Relative %: 75

## 2010-10-29 LAB — COMPREHENSIVE METABOLIC PANEL
ALT: 22
AST: 34
Albumin: 4.3
CO2: 29
Calcium: 10
GFR calc Af Amer: >60
Sodium: 139
Total Protein: 7.5

## 2010-10-29 LAB — URINALYSIS, ROUTINE W REFLEX MICROSCOPIC
Bilirubin Urine: NEGATIVE
Bilirubin Urine: NEGATIVE
Glucose, UA: NEGATIVE
Glucose, UA: NEGATIVE
Hgb urine dipstick: NEGATIVE
Hgb urine dipstick: NEGATIVE
Ketones, ur: NEGATIVE
Ketones, ur: NEGATIVE
Nitrite: NEGATIVE
Nitrite: NEGATIVE
Protein, ur: NEGATIVE
Protein, ur: NEGATIVE
Specific Gravity, Urine: 1.001 — ABNORMAL LOW
Specific Gravity, Urine: 1.017
Urobilinogen, UA: 0.2
Urobilinogen, UA: 1
pH: 6.5
pH: 7

## 2010-10-29 LAB — URINE MICROSCOPIC-ADD ON

## 2010-10-29 LAB — POCT I-STAT, CHEM 8
BUN: 15
Calcium, Ion: 1.13
Chloride: 103
Creatinine, Ser: 1.1
Glucose, Bld: 107 — ABNORMAL HIGH
HCT: 52
Hemoglobin: 17.7 — ABNORMAL HIGH
Potassium: 4
Sodium: 137
TCO2: 30

## 2010-10-29 LAB — BASIC METABOLIC PANEL
CO2: 31
Calcium: 9.7
GFR calc Af Amer: >60
GFR calc non Af Amer: >60
Glucose, Bld: 113 — ABNORMAL HIGH
Potassium: 3.9
Sodium: 140

## 2010-10-29 LAB — LIPASE, BLOOD: Lipase: 23

## 2010-10-29 LAB — CBC
HCT: 49.8
Hemoglobin: 16.5
MCHC: 33.2
RBC: 5.46
RBC: 5.55
RDW: 14
RDW: 14.1

## 2010-10-29 LAB — URINE CULTURE
Colony Count: NO GROWTH
Culture: NO GROWTH

## 2010-10-29 LAB — OCCULT BLOOD X 1 CARD TO LAB, STOOL: Fecal Occult Bld: NEGATIVE

## 2010-10-31 LAB — COMPREHENSIVE METABOLIC PANEL
AST: 23 U/L (ref 0–37)
BUN: 9 mg/dL (ref 6–23)
CO2: 31 mEq/L (ref 19–32)
Calcium: 9.2 mg/dL (ref 8.4–10.5)
Creatinine, Ser: 1.04 mg/dL (ref 0.4–1.5)
GFR calc Af Amer: >60 mL/min (ref >60)
GFR calc non Af Amer: >60 mL/min (ref >60)

## 2010-10-31 LAB — URINALYSIS, ROUTINE W REFLEX MICROSCOPIC
Glucose, UA: NEGATIVE mg/dL
Ketones, ur: NEGATIVE mg/dL
Nitrite: NEGATIVE
Specific Gravity, Urine: 1.017 (ref 1.005–1.030)
pH: 7 (ref 5.0–8.0)

## 2010-10-31 LAB — CBC
HCT: 46.5 % (ref 39.0–52.0)
MCHC: 33.1 g/dL (ref 30.0–36.0)
MCV: 90.6 fL (ref 78.0–100.0)
Platelets: 216 10*3/uL (ref 150–400)

## 2010-10-31 LAB — DIFFERENTIAL
Basophils Absolute: 0 10*3/uL (ref 0.0–0.1)
Eosinophils Relative: 1 % (ref 0–5)
Lymphocytes Relative: 18 % (ref 12–46)
Lymphs Abs: 1.4 10*3/uL (ref 0.7–4.0)
Neutro Abs: 5.9 10*3/uL (ref 1.7–7.7)

## 2011-01-27 HISTORY — PX: NM MYOCAR PERF WALL MOTION: HXRAD629

## 2011-03-02 ENCOUNTER — Other Ambulatory Visit: Payer: Self-pay | Admitting: Family Medicine

## 2011-03-02 DIAGNOSIS — M549 Dorsalgia, unspecified: Secondary | ICD-10-CM

## 2011-03-04 ENCOUNTER — Other Ambulatory Visit: Payer: Self-pay | Admitting: Family Medicine

## 2011-03-04 DIAGNOSIS — IMO0002 Reserved for concepts with insufficient information to code with codable children: Secondary | ICD-10-CM

## 2011-03-16 ENCOUNTER — Ambulatory Visit
Admission: RE | Admit: 2011-03-16 | Discharge: 2011-03-16 | Disposition: A | Discharge status: Home / Self Care | Payer: Medicare Other | Source: Ambulatory Visit | Attending: Family Medicine | Admitting: Family Medicine

## 2011-03-16 DIAGNOSIS — IMO0002 Reserved for concepts with insufficient information to code with codable children: Secondary | ICD-10-CM

## 2011-03-16 MED ORDER — ONDANSETRON HCL 4 MG/2ML IJ SOLN: 4.0000 mg | Freq: Four times a day (QID) | INTRAMUSCULAR | Status: DC | PRN

## 2011-03-16 MED ORDER — DIAZEPAM 5 MG PO TABS
10.0000 mg | ORAL_TABLET | Freq: Once | ORAL | Status: AC
  Administered 2011-03-16: 10 mg via ORAL

## 2011-03-16 NOTE — Discharge Instructions (Signed)
Myelogram  Discharge Instructions  1. Go home and rest quietly for the next 24 hours.  It is important to lie flat for the next 24 hours.  Get up only to go to the restroom.  You may lie in the bed or on a couch on your back, your stomach, your left side or your right side.  You may have one pillow under your head.  You may have pillows between your knees while you are on your side or under your knees while you are on your back.  2. DO NOT drive today.  Recline the seat as far back as it will go, while still wearing your seat belt, on the way home.  3. You may get up to go to the bathroom as needed.  You may sit up for 10 minutes to eat.  You may resume your normal diet and medications unless otherwise indicated.  Drink lots of extra fluids today and tomorrow.  4. The incidence of headache, nausea, or vomiting is about 5% (one in 20 patients).  If you develop a headache, lie flat and drink plenty of fluids until the headache goes away.  Caffeinated beverages may be helpful.  If you develop severe nausea and vomiting or a headache that does not go away with flat bed rest, call [phone].  5. You may resume normal activities after your 24 hours of bed rest is over; however, do not exert yourself strongly or do any heavy lifting tomorrow.  6. Call your physician for a follow-up appointment.  The results of your myelogram will be sent directly to your physician by the following day.  7. If you have any questions or if complications develop after you arrive home, please call [phone].  Discharge instructions have been explained to the patient.  The patient, or the person responsible for the patient, fully understands these instructions.   May resume effexor on March 17, 2011, after 11:00 am.

## 2011-03-16 NOTE — Progress Notes (Addendum)
Pt has taken 13 hour prep for his allergy.  Pt has been off of effexor for the last 2 days.

## 2011-03-17 ENCOUNTER — Telehealth: Payer: Self-pay | Admitting: Radiology

## 2011-03-17 NOTE — Telephone Encounter (Signed)
Pt c/o vomiting from the hiccups and acid reflux that started about 5-6 pm last night.  Pt does not have any meds to block acid, he will try pepcid complete and if not better by noon will call back and we will try something else. May need a prescription.

## 2011-04-30 ENCOUNTER — Encounter (HOSPITAL_COMMUNITY): Payer: Self-pay | Admitting: Emergency Medicine

## 2011-04-30 ENCOUNTER — Emergency Department (HOSPITAL_COMMUNITY)
Admission: EM | Admit: 2011-04-30 | Discharge: 2011-04-30 | Disposition: A | Discharge status: Home Health | Payer: Medicare Other | Attending: Emergency Medicine | Admitting: Emergency Medicine

## 2011-04-30 DIAGNOSIS — R079 Chest pain, unspecified: Secondary | ICD-10-CM | POA: Insufficient documentation

## 2011-04-30 DIAGNOSIS — F411 Generalized anxiety disorder: Secondary | ICD-10-CM | POA: Insufficient documentation

## 2011-04-30 DIAGNOSIS — R070 Pain in throat: Secondary | ICD-10-CM | POA: Insufficient documentation

## 2011-04-30 DIAGNOSIS — I2510 Atherosclerotic heart disease of native coronary artery without angina pectoris: Secondary | ICD-10-CM | POA: Insufficient documentation

## 2011-04-30 DIAGNOSIS — I10 Essential (primary) hypertension: Secondary | ICD-10-CM | POA: Insufficient documentation

## 2011-04-30 DIAGNOSIS — Z79899 Other long term (current) drug therapy: Secondary | ICD-10-CM | POA: Insufficient documentation

## 2011-04-30 DIAGNOSIS — T7840XA Allergy, unspecified, initial encounter: Secondary | ICD-10-CM | POA: Insufficient documentation

## 2011-04-30 HISTORY — DX: Unspecified injury of head, initial encounter: S09.90XA

## 2011-04-30 HISTORY — DX: Atherosclerotic heart disease of native coronary artery without angina pectoris: I25.10

## 2011-04-30 HISTORY — DX: Essential (primary) hypertension: I10

## 2011-04-30 LAB — COMPREHENSIVE METABOLIC PANEL
BUN: 19 mg/dL (ref 6–23)
CO2: 26 mEq/L (ref 19–32)
Calcium: 9.8 mg/dL (ref 8.4–10.5)
Chloride: 103 mEq/L (ref 96–112)
Creatinine, Ser: 1.15 mg/dL (ref 0.50–1.35)
GFR calc Af Amer: 78 mL/min — ABNORMAL LOW (ref >90)
GFR calc non Af Amer: 67 mL/min — ABNORMAL LOW (ref >90)
Glucose, Bld: 115 mg/dL — ABNORMAL HIGH (ref 70–99)
Total Bilirubin: 0.6 mg/dL (ref 0.3–1.2)

## 2011-04-30 LAB — DIFFERENTIAL
Basophils Relative: 0 % (ref 0–1)
Eosinophils Absolute: 0 10*3/uL (ref 0.0–0.7)
Lymphs Abs: 0.8 10*3/uL (ref 0.7–4.0)
Monocytes Relative: 4 % (ref 3–12)
Neutro Abs: 11.9 10*3/uL — ABNORMAL HIGH (ref 1.7–7.7)
Neutrophils Relative %: 90 % — ABNORMAL HIGH (ref 43–77)

## 2011-04-30 LAB — CBC
Hemoglobin: 15.5 g/dL (ref 13.0–17.0)
Platelets: 175 10*3/uL (ref 150–400)
RBC: 5.05 MIL/uL (ref 4.22–5.81)

## 2011-04-30 MED ORDER — DIPHENHYDRAMINE HCL 50 MG/ML IJ SOLN
25.0000 mg | Freq: Once | INTRAMUSCULAR | Status: AC
  Administered 2011-04-30: 25 mg via INTRAVENOUS
  Filled 2011-04-30: qty 1

## 2011-04-30 MED ORDER — DIPHENHYDRAMINE HCL 50 MG/ML IJ SOLN
25.0000 mg | Freq: Once | INTRAMUSCULAR | Status: AC
  Administered 2011-04-30: 50 mg via INTRAVENOUS
  Filled 2011-04-30: qty 1

## 2011-04-30 MED ORDER — FAMOTIDINE IN NACL 20-0.9 MG/50ML-% IV SOLN
20.0000 mg | Freq: Once | INTRAVENOUS | Status: AC
  Administered 2011-04-30: 20 mg via INTRAVENOUS
  Filled 2011-04-30: qty 50

## 2011-04-30 MED ORDER — FENTANYL CITRATE 0.05 MG/ML IJ SOLN
100.0000 ug | Freq: Once | INTRAMUSCULAR | Status: AC
  Administered 2011-04-30: 100 ug via INTRAVENOUS
  Filled 2011-04-30: qty 2

## 2011-04-30 MED ORDER — CLONIDINE HCL 0.1 MG PO TABS
0.2000 mg | ORAL_TABLET | Freq: Once | ORAL | Status: AC
  Administered 2011-04-30: 0.2 mg via ORAL
  Filled 2011-04-30: qty 2

## 2011-04-30 MED ORDER — FAMOTIDINE 40 MG PO TABS: 40.0000 mg | ORAL_TABLET | Freq: Every day | ORAL | Status: DC

## 2011-04-30 MED ORDER — CLONIDINE HCL 0.2 MG PO TABS: 0.1000 mg | ORAL_TABLET | Freq: Two times a day (BID) | ORAL | Status: DC

## 2011-04-30 MED ORDER — METHYLPREDNISOLONE 4 MG PO KIT: PACK | ORAL | Status: AC

## 2011-04-30 NOTE — ED Notes (Signed)
Pt c/o burning and itching inside including tongue and gums. Pt was being seen by primary physician and stated he called the paramedics because pt was sob, dizzy, and sweating a lot. Pt alert and oriented and in no distress.

## 2011-04-30 NOTE — ED Notes (Signed)
ZOX:[id number]<AV> Expected date:04/30/11<BR> Expected time: 1:22 PM<BR> Means of arrival:Ambulance<BR> Comments:<BR> M40. 61 yo m. SOB, hx of COPD and asthma. Just started PT at orthopedics. 5 mins

## 2011-04-30 NOTE — ED Provider Notes (Signed)
History     CSN: [id number]  Arrival date & time 04/30/11  1327   First MD Initiated Contact with Patient 04/30/11 1503      Chief Complaint  Patient presents with  . Allergic Reaction    C/O burning in throat and chest.      HPI Pt c/o throat and chest "burning and itching". This is a recurring condition that has not been diagnosed. Has Rx for Prednisone to take when sx occur. Also takes Benadryl.  Past Medical History  Diagnosis Date  . Coronary artery disease   . Hypertension   . Head injury     Past Surgical History  Procedure Date  . Total shoulder arthroplasty   . Pacemaker insertion     History reviewed. No pertinent family history.  History  Substance Use Topics  . Smoking status: Former Smoker -- 1.0 packs/day for 15 years    Types: Cigarettes    Quit date: 03/15/1996  . Smokeless tobacco: Never Used  . Alcohol Use: No      Review of Systems  All other systems reviewed and are negative.    Allergies  Iodinated diagnostic agents; Soy allergy; and Wheat  Home Medications   Current Outpatient Rx  Name Route Sig Dispense Refill  . AMLODIPINE-OLMESARTAN 10-40 MG PO TABS Oral Take 1 tablet by mouth daily.    . ATENOLOL 25 MG PO TABS Oral Take 50 mg by mouth 2 (two) times daily. 2am x times    . BUPROPION HCL ER (XL) 150 MG PO TB24 Oral Take 150 mg by mouth daily.    [name] CLONAZEPAM 1 MG PO TABS Oral Take 1 mg by mouth 4 (four) times daily.    [name] DIPHENHYDRAMINE HCL 25 MG PO TABS Oral Take 25 mg by mouth every 6 (six) hours as needed.    [name] HYDROCHLOROTHIAZIDE 25 MG PO TABS Oral Take 25 mg by mouth daily.    . OXYCODONE-ACETAMINOPHEN 2.5-325 MG PO TABS Oral Take 1 tablet by mouth every 4 (four) hours as needed. pain    . TAMSULOSIN HCL 0.4 MG PO CAPS Oral Take by mouth.    . CLONIDINE HCL 0.2 MG PO TABS Oral Take 0.5 tablets (0.1 mg total) by mouth 2 (two) times daily. 60 tablet 0  . FAMOTIDINE 40 MG PO TABS Oral Take 1 tablet (40 mg total) by mouth daily.  20 tablet 0  . METHYLPREDNISOLONE 4 MG PO KIT  Take 1 tablet daily for 3 days then 1 tablet every other day for 3 days and then stop 10 tablet 0    Take 1 tablet daily for 3 days then 1 tablet every ...    BP 124/83  Pulse 70  Temp(Src) 98.2 F (36.8 C) (Oral)  Resp 14  SpO2 97%  Physical Exam  Nursing note and vitals reviewed. Constitutional: He is oriented to person, place, and time. He appears well-developed and well-nourished. No distress.  HENT:  Head: Normocephalic and atraumatic.  Mouth/Throat: Oropharynx is clear and moist.  Eyes: Pupils are equal, round, and reactive to light.  Neck: Normal range of motion.  Cardiovascular: Normal rate and intact distal pulses.  Exam reveals no gallop.   No murmur heard. Pulmonary/Chest: No respiratory distress. He has no wheezes.  Abdominal: Soft. Normal appearance. He exhibits no distension.  Musculoskeletal: Normal range of motion.  Neurological: He is alert and oriented to person, place, and time. No cranial nerve deficit.  Skin: Skin is warm and dry. No rash  noted.  Psychiatric: His behavior is normal. His mood appears anxious.    ED Course  Procedures (including critical care time) Scheduled Meds:   . cloNIDine  0.2 mg Oral Once  . diphenhydrAMINE  25 mg Intravenous Once  . diphenhydrAMINE  25 mg Intravenous Once  . famotidine (PEPCID) IV  20 mg Intravenous Once  . fentaNYL  100 mcg Intravenous Once   Continuous Infusions:  PRN Meds:.  Labs Reviewed  COMPREHENSIVE METABOLIC PANEL - Abnormal; Notable for the following:    Glucose, Bld 115 (*)    GFR calc non Af Amer 67 (*)    GFR calc Af Amer 78 (*)    All other components within normal limits  CBC - Abnormal; Notable for the following:    WBC 13.3 (*)    All other components within normal limits  DIFFERENTIAL - Abnormal; Notable for the following:    Neutrophils Relative 90 (*)    Neutro Abs 11.9 (*)    Lymphocytes Relative 6 (*)    All other components within  normal limits   No results found.   1. Allergic reaction   2. Hypertension       MDM  After treatment in the ED the patient feels back to baseline and wants to go home.         Nelia Shi, MD 04/30/11 2001

## 2011-04-30 NOTE — ED Notes (Signed)
Pt c/o throat and chest "burning and itching". This is a recurring condition that has not been diagnosed. Has Rx for Prednisone to take when sx occur. Also takes Benadryl.

## 2011-05-29 ENCOUNTER — Emergency Department (HOSPITAL_COMMUNITY)
Admission: EM | Admit: 2011-05-29 | Discharge: 2011-05-29 | Disposition: A | Discharge status: Home / Self Care | Payer: Medicare Other | Attending: Emergency Medicine | Admitting: Emergency Medicine

## 2011-05-29 ENCOUNTER — Encounter (HOSPITAL_COMMUNITY): Payer: Self-pay

## 2011-05-29 DIAGNOSIS — I2510 Atherosclerotic heart disease of native coronary artery without angina pectoris: Secondary | ICD-10-CM | POA: Insufficient documentation

## 2011-05-29 DIAGNOSIS — Z79899 Other long term (current) drug therapy: Secondary | ICD-10-CM | POA: Insufficient documentation

## 2011-05-29 DIAGNOSIS — I10 Essential (primary) hypertension: Secondary | ICD-10-CM | POA: Insufficient documentation

## 2011-05-29 DIAGNOSIS — R1110 Vomiting, unspecified: Secondary | ICD-10-CM | POA: Insufficient documentation

## 2011-05-29 DIAGNOSIS — E860 Dehydration: Secondary | ICD-10-CM

## 2011-05-29 LAB — COMPREHENSIVE METABOLIC PANEL
ALT: 27 U/L (ref 0–53)
AST: 24 U/L (ref 0–37)
Alkaline Phosphatase: 68 U/L (ref 39–117)
CO2: 29 mEq/L (ref 19–32)
Chloride: 104 mEq/L (ref 96–112)
GFR calc Af Amer: >90 mL/min (ref >90)
GFR calc non Af Amer: 87 mL/min — ABNORMAL LOW (ref >90)
Glucose, Bld: 109 mg/dL — ABNORMAL HIGH (ref 70–99)
Potassium: 4 mEq/L (ref 3.5–5.1)
Sodium: 142 mEq/L (ref 135–145)
Total Bilirubin: 0.5 mg/dL (ref 0.3–1.2)

## 2011-05-29 LAB — DIFFERENTIAL
Basophils Absolute: 0 10*3/uL (ref 0.0–0.1)
Eosinophils Relative: 1 % (ref 0–5)
Lymphocytes Relative: 16 % (ref 12–46)
Lymphs Abs: 1.4 10*3/uL (ref 0.7–4.0)
Neutro Abs: 6.5 10*3/uL (ref 1.7–7.7)

## 2011-05-29 LAB — CBC
MCV: 89.6 fL (ref 78.0–100.0)
Platelets: 162 10*3/uL (ref 150–400)
RBC: 5.11 MIL/uL (ref 4.22–5.81)
RDW: 14.1 % (ref 11.5–15.5)
WBC: 8.7 10*3/uL (ref 4.0–10.5)

## 2011-05-29 LAB — URINALYSIS, ROUTINE W REFLEX MICROSCOPIC
Glucose, UA: NEGATIVE mg/dL
Hgb urine dipstick: NEGATIVE
Protein, ur: NEGATIVE mg/dL
Specific Gravity, Urine: 1.009 (ref 1.005–1.030)
Urobilinogen, UA: 0.2 mg/dL (ref 0.0–1.0)

## 2011-05-29 LAB — CK: Total CK: 114 U/L (ref 7–232)

## 2011-05-29 LAB — SEDIMENTATION RATE: Sed Rate: 1 mm/hr (ref 0–16)

## 2011-05-29 LAB — URINE MICROSCOPIC-ADD ON

## 2011-05-29 MED ORDER — ONDANSETRON HCL 4 MG/2ML IJ SOLN
INTRAMUSCULAR | Status: AC
  Filled 2011-05-29: qty 2

## 2011-05-29 MED ORDER — SODIUM CHLORIDE 0.9 % IV BOLUS (SEPSIS)
1000.0000 mL | Freq: Once | INTRAVENOUS | Status: AC
  Administered 2011-05-29: 1000 mL via INTRAVENOUS

## 2011-05-29 MED ORDER — SODIUM CHLORIDE 0.9 % IV SOLN
Freq: Once | INTRAVENOUS | Status: AC
  Administered 2011-05-29: 125 mL/h via INTRAVENOUS

## 2011-05-29 MED ORDER — ONDANSETRON HCL 4 MG/2ML IJ SOLN
4.0000 mg | Freq: Once | INTRAMUSCULAR | Status: AC
  Administered 2011-05-29: 4 mg via INTRAVENOUS

## 2011-05-29 NOTE — ED Notes (Signed)
Pt received to [facility] with c/o chronic on an off generalized itching . Pt also c/o on and off emesis with pain to the lt side. Pt was seen by his PMD but not better.

## 2011-05-29 NOTE — ED Notes (Signed)
MD at bedside. 

## 2011-05-29 NOTE — ED Provider Notes (Addendum)
History     CSN: [id number]  Arrival date & time 05/29/11  1507   First MD Initiated Contact with Patient 05/29/11 1653      Chief Complaint  Patient presents with  . Emesis    (Consider location/radiation/quality/duration/timing/severity/associated sxs/prior treatment) Patient is a 61 y.o. male presenting with vomiting. The history is provided by the patient.  Emesis    patient presents with whole-body burning and itching which is been intermittent for 4 years without a diagnosis made. Symptoms worse for the last 5-6 weeks. Has been seen by his primary care Dr. and has been on steroids without relief. Complains of pain to his left flank without diarrhea or fever. No visible rashes appreciated. Has been having vomiting which is been nonbilious. Slight shortness of breath noted per patient. Called his primary care doctor he was sent here for possible dehydration. He denies any chest pain or chest pressure. No hematuria or dysuria. Nothing makes the symptoms better or are worse  Past Medical History  Diagnosis Date  . Coronary artery disease   . Hypertension   . Head injury     Past Surgical History  Procedure Date  . Total shoulder arthroplasty   . Pacemaker insertion     No family history on file.  History  Substance Use Topics  . Smoking status: Former Smoker -- 1.0 packs/day for 15 years    Types: Cigarettes    Quit date: 03/15/1996  . Smokeless tobacco: Never Used  . Alcohol Use: No      Review of Systems  Gastrointestinal: Positive for vomiting.  All other systems reviewed and are negative.    Allergies  Iodinated diagnostic agents; Soy allergy; and Wheat  Home Medications   Current Outpatient Rx  Name Route Sig Dispense Refill  . AMLODIPINE-OLMESARTAN 10-40 MG PO TABS Oral Take 1 tablet by mouth daily.    . ATENOLOL 25 MG PO TABS Oral Take 50 mg by mouth 2 (two) times daily. 2am x times    . BUPROPION HCL ER (XL) 150 MG PO TB24 Oral Take 150 mg by  mouth daily.    [name] LEVOCETIRIZINE DIHYDROCHLORIDE 5 MG PO TABS Oral Take 10 mg by mouth every evening.    . OXYCODONE-ACETAMINOPHEN 2.5-325 MG PO TABS Oral Take 1 tablet by mouth every 4 (four) hours as needed. pain    . TAMSULOSIN HCL 0.4 MG PO CAPS Oral Take by mouth.      BP 151/104  Pulse 75  Temp(Src) 98.4 F (36.9 C) (Oral)  Resp 20  SpO2 99%  Physical Exam  Nursing note and vitals reviewed. Constitutional: He is oriented to person, place, and time. He appears well-developed and well-nourished.  Non-toxic appearance. No distress.  HENT:  Head: Normocephalic and atraumatic.  Eyes: Conjunctivae, EOM and lids are normal. Pupils are equal, round, and reactive to light.  Neck: Normal range of motion. Neck supple. No tracheal deviation present. No mass present.  Cardiovascular: Normal rate, regular rhythm and normal heart sounds.  Exam reveals no gallop.   No murmur heard. Pulmonary/Chest: Effort normal and breath sounds normal. No stridor. No respiratory distress. He has no decreased breath sounds. He has no wheezes. He has no rhonchi. He has no rales.  Abdominal: Soft. Normal appearance and bowel sounds are normal. He exhibits no distension. There is no tenderness. There is no rebound and no CVA tenderness.  Musculoskeletal: Normal range of motion. He exhibits no edema and no tenderness.  Neurological: He is alert and  oriented to person, place, and time. He has normal strength. No cranial nerve deficit or sensory deficit. GCS eye subscore is 4. GCS verbal subscore is 5. GCS motor subscore is 6.  Skin: Skin is warm and dry. No abrasion and no rash noted.  Psychiatric: He has a normal mood and affect. His speech is normal and behavior is normal.    ED Course  Procedures (including critical care time)   Labs Reviewed  URINALYSIS, ROUTINE W REFLEX MICROSCOPIC   No results found.   No diagnosis found.    MDM  Patient given IV fluids here and feels better. No acute medical  condition noted. He will f/u his pcp as needed    Date: 05/29/2011  Rate: 75  Rhythm: paced  QRS Axis: left  Intervals: normal  ST/T Wave abnormalities: nonspecific ST changes  Conduction Disutrbances:paced  Narrative Interpretation:   Old EKG Reviewed: changes noted       Toy Baker, MD 05/29/11 1933  Toy Baker, MD 05/29/11 [phone]

## 2011-05-29 NOTE — Discharge Instructions (Signed)
Follow up with dr. [name] as needed--return here should your symptoms worsen

## 2011-05-29 NOTE — ED Notes (Signed)
Pt. Reports that they have no idea what he is allergic too.

## 2011-05-29 NOTE — ED Notes (Signed)
Vomiting for 1 month, and burning and itchy skin for 4 years, dizzines and lightheaded for 4 years when he has this reactiion.   Pt. Has loss 16 lbs in 1 month.  Dr. [name] sent pt. To [place].

## 2011-05-30 LAB — URINE CULTURE: Culture  Setup Time: 201305031754

## 2011-12-14 ENCOUNTER — Emergency Department (HOSPITAL_COMMUNITY): Payer: Medicare Other

## 2011-12-14 ENCOUNTER — Observation Stay (HOSPITAL_COMMUNITY)
Admission: EM | Admit: 2011-12-14 | Discharge: 2011-12-18 | Disposition: A | Discharge status: Home / Self Care | Payer: Medicare Other | Attending: Internal Medicine | Admitting: Internal Medicine

## 2011-12-14 ENCOUNTER — Encounter (HOSPITAL_COMMUNITY): Payer: Self-pay | Admitting: Adult Health

## 2011-12-14 DIAGNOSIS — R0609 Other forms of dyspnea: Secondary | ICD-10-CM | POA: Insufficient documentation

## 2011-12-14 DIAGNOSIS — I200 Unstable angina: Secondary | ICD-10-CM | POA: Insufficient documentation

## 2011-12-14 DIAGNOSIS — R7989 Other specified abnormal findings of blood chemistry: Secondary | ICD-10-CM | POA: Diagnosis present

## 2011-12-14 DIAGNOSIS — R931 Abnormal findings on diagnostic imaging of heart and coronary circulation: Secondary | ICD-10-CM

## 2011-12-14 DIAGNOSIS — I2510 Atherosclerotic heart disease of native coronary artery without angina pectoris: Principal | ICD-10-CM | POA: Insufficient documentation

## 2011-12-14 DIAGNOSIS — Z950 Presence of cardiac pacemaker: Secondary | ICD-10-CM

## 2011-12-14 DIAGNOSIS — Z87891 Personal history of nicotine dependence: Secondary | ICD-10-CM | POA: Insufficient documentation

## 2011-12-14 DIAGNOSIS — M79609 Pain in unspecified limb: Secondary | ICD-10-CM | POA: Insufficient documentation

## 2011-12-14 DIAGNOSIS — Z45018 Encounter for adjustment and management of other part of cardiac pacemaker: Secondary | ICD-10-CM | POA: Insufficient documentation

## 2011-12-14 DIAGNOSIS — Z91041 Radiographic dye allergy status: Secondary | ICD-10-CM | POA: Insufficient documentation

## 2011-12-14 DIAGNOSIS — R079 Chest pain, unspecified: Secondary | ICD-10-CM | POA: Insufficient documentation

## 2011-12-14 DIAGNOSIS — R55 Syncope and collapse: Secondary | ICD-10-CM | POA: Insufficient documentation

## 2011-12-14 DIAGNOSIS — I10 Essential (primary) hypertension: Secondary | ICD-10-CM | POA: Insufficient documentation

## 2011-12-14 DIAGNOSIS — M5030 Other cervical disc degeneration, unspecified cervical region: Secondary | ICD-10-CM | POA: Insufficient documentation

## 2011-12-14 DIAGNOSIS — R0989 Other specified symptoms and signs involving the circulatory and respiratory systems: Secondary | ICD-10-CM | POA: Insufficient documentation

## 2011-12-14 DIAGNOSIS — R110 Nausea: Secondary | ICD-10-CM | POA: Insufficient documentation

## 2011-12-14 DIAGNOSIS — R1310 Dysphagia, unspecified: Secondary | ICD-10-CM | POA: Insufficient documentation

## 2011-12-14 DIAGNOSIS — Z79899 Other long term (current) drug therapy: Secondary | ICD-10-CM | POA: Insufficient documentation

## 2011-12-14 HISTORY — DX: Chest pain, unspecified: R07.9

## 2011-12-14 HISTORY — DX: Dysphagia, unspecified: R13.10

## 2011-12-14 HISTORY — DX: Syncope and collapse: R55

## 2011-12-14 LAB — CBC
HCT: 45 % (ref 39.0–52.0)
Hemoglobin: 14.7 g/dL (ref 13.0–17.0)
MCH: 28.6 pg (ref 26.0–34.0)
MCHC: 32.7 g/dL (ref 30.0–36.0)
MCV: 87.5 fL (ref 78.0–100.0)
RDW: 14.8 % (ref 11.5–15.5)

## 2011-12-14 LAB — BASIC METABOLIC PANEL
BUN: 19 mg/dL (ref 6–23)
Creatinine, Ser: 1.26 mg/dL (ref 0.50–1.35)
GFR calc Af Amer: 69 mL/min — ABNORMAL LOW (ref >90)
GFR calc non Af Amer: 59 mL/min — ABNORMAL LOW (ref >90)
Glucose, Bld: 106 mg/dL — ABNORMAL HIGH (ref 70–99)

## 2011-12-14 MED ORDER — NITROGLYCERIN 0.4 MG SL SUBL
0.4000 mg | SUBLINGUAL_TABLET | SUBLINGUAL | Status: DC | PRN
  Administered 2011-12-14 (×2): 0.4 mg via SUBLINGUAL
  Filled 2011-12-14 (×3): qty 25

## 2011-12-14 MED ORDER — FENTANYL CITRATE 0.05 MG/ML IJ SOLN
50.0000 ug | Freq: Once | INTRAMUSCULAR | Status: AC
  Administered 2011-12-14: 50 ug via INTRAVENOUS

## 2011-12-14 NOTE — H&P (Signed)
PCP:   Alva Garnet., MD   Chief Complaint:  cp  HPI: 61 yo male with h/o nonobs cad, htn comes in with 2-3 days of sscp that radiates down left arm that is pleuritic in nature and sharp.  Waxes/wanes.  No recent travleing.  No h/o mi in past.  Not reproducible with palp.  No fevers/cough.  No le edema.  allegic to contrast so could not get cta to r/o pe and d dimer is positive.  Currently still having cp and no ntg given as of yet.  No n/v/d.  No abd pain.  No orthopnea/pnd  Review of Systems:  O/w neg  Past Medical History: Past Medical History  Diagnosis Date  . Coronary artery disease   . Hypertension   . Head injury    Past Surgical History  Procedure Date  . Total shoulder arthroplasty   . Pacemaker insertion     Medications: Prior to Admission medications   Medication Sig Start Date End Date Taking? Authorizing Provider  ALPRAZolam (XANAX PO) Take 1 tablet by mouth daily.   Yes Historical Provider, MD  amLODipine-olmesartan (AZOR) 10-40 MG per tablet Take 1 tablet by mouth daily.   Yes Historical Provider, MD  aspirin EC 81 MG tablet Take 81 mg by mouth daily.   Yes Historical Provider, MD  atenolol (TENORMIN) 25 MG tablet Take 50 mg by mouth 2 (two) times daily.    Yes Historical Provider, MD  Cholecalciferol (VITAMIN D) 2000 UNITS tablet Take 6,000 Units by mouth daily.   Yes Historical Provider, MD  Cyanocobalamin (VITAMIN B-12 CR) 1500 MCG TBCR Take 3,000 mcg by mouth daily.   Yes Historical Provider, MD  loratadine (CLARITIN) 10 MG tablet Take 10 mg by mouth daily.   Yes Historical Provider, MD  oxyCODONE-acetaminophen (PERCOCET) 10-325 MG per tablet Take 1 tablet by mouth daily as needed. For pain   Yes Historical Provider, MD  PRESCRIPTION MEDICATION Inject 1 application as directed every other day. Allergy shot  provided by allergist   Yes Historical Provider, MD  Tamsulosin HCl (FLOMAX) 0.4 MG CAPS Take 0.4 mg by mouth daily.    Yes Historical Provider, MD     Allergies:   Allergies  Allergen Reactions  . Apple Anaphylaxis  . Carrot (Daucus Carota) Anaphylaxis  . Iodinated Diagnostic Agents Shortness Of Breath and Swelling    Can have contrast as long as he has a 13 prep  . Oat Anaphylaxis  . Soy Allergy Anaphylaxis  . Wheat Anaphylaxis  . Almond Meal Itching    Social History:  reports that he quit smoking about 15 years ago. His smoking use included Cigarettes. He has a 15 pack-year smoking history. He has never used smokeless tobacco. He reports that he does not drink alcohol or use illicit drugs.  Family History: History reviewed. No pertinent family history.  Physical Exam: Filed Vitals:   12/14/11 1615 12/14/11 2121 12/14/11 2200  BP: 167/87 157/87 126/77  Pulse: 65 65 120  Temp: 97.8 F (36.6 C)    Resp: 18 18 20   SpO2: 99% 100% 100%   General appearance: alert, cooperative and no distress Neck: no JVD and supple, symmetrical, trachea midline Lungs: clear to auscultation bilaterally Heart: regular rate and rhythm, S1, S2 normal, no murmur, click, rub or gallop Abdomen: soft, non-tender; bowel sounds normal; no masses,  no organomegaly Extremities: extremities normal, atraumatic, no cyanosis or edema Pulses: 2+ and symmetric Skin: Skin color, texture, turgor normal. No rashes or lesions Neurologic:  Grossly normal   Labs on Admission:   [facility] 12/14/11 1618  NA 137  K 4.4  CL 102  CO2 26  GLUCOSE 106*  BUN 19  CREATININE 1.26  CALCIUM 9.4  MG --  PHOS --    Basename 12/14/11 1618  WBC 7.5  NEUTROABS --  HGB 14.7  HCT 45.0  MCV 87.5  PLT 193   Radiological Exams on Admission: Dg Chest 2 View  12/14/2011  *RADIOLOGY REPORT*  Clinical Data: Chest pain.  CHEST - 2 VIEW  Comparison: 08/21/2009.  Findings: The pacer wires are stable.  The cardiac silhouette, mediastinal and hilar contours are normal and stable.  The lungs are clear.  The bony structures are intact.  A right shoulder prosthesis is  noted.  IMPRESSION: No acute cardiopulmonary findings.   Original Report Authenticated By: Rudie Meyer, M.D.    Ct Head Wo Contrast  12/14/2011  *RADIOLOGY REPORT*  Clinical Data:  CHEST PAIN. SYNCOPE, BLUNT TRAUMA POST FALL  CT HEAD WITHOUT CONTRAST CT CERVICAL SPINE WITHOUT CONTRAST  Technique:  Multidetector CT imaging of the head and cervical spine was performed following the standard protocol without IV contrast. Multiplanar CT image reconstructions of the cervical spine were also generated.  Comparison:  12/28/2007  CT HEAD  Findings: Stable tentorial and falcine calcifications.  Mild atrophy. There is no evidence of acute intracranial hemorrhage, brain edema, mass lesion, acute infarction,   mass effect, or midline shift. Acute infarct may be inapparent on noncontrast CT. No other intra-axial abnormalities are seen, and the ventricles and sulci are within normal limits in size and symmetry.   No abnormal extra-axial fluid collections or masses are identified.  No significant calvarial abnormality.  IMPRESSION: 1. Negative for bleed or other acute intracranial process.  CT CERVICAL SPINE  Findings: Normal alignment.  No prevertebral soft tissue swelling. There is moderate narrowing of C5-6 and C6-7 interspaces.  Anterior endplate spurring at all levels C3-C7.  Negative for fracture. Uncovertebral spurring results in early foraminal encroachment bilaterally C5-6 and C6-7.  IMPRESSION:  1.  Negative for fracture or other acute bony abnormality. 2.  Multilevel degenerative changes as above.   Original Report Authenticated By: D. Andria Rhein, MD    Ct Cervical Spine Wo Contrast  12/14/2011  *RADIOLOGY REPORT*  Clinical Data:  CHEST PAIN. SYNCOPE, BLUNT TRAUMA POST FALL  CT HEAD WITHOUT CONTRAST CT CERVICAL SPINE WITHOUT CONTRAST  Technique:  Multidetector CT imaging of the head and cervical spine was performed following the standard protocol without IV contrast. Multiplanar CT image reconstructions of the  cervical spine were also generated.  Comparison:  12/28/2007  CT HEAD  Findings: Stable tentorial and falcine calcifications.  Mild atrophy. There is no evidence of acute intracranial hemorrhage, brain edema, mass lesion, acute infarction,   mass effect, or midline shift. Acute infarct may be inapparent on noncontrast CT. No other intra-axial abnormalities are seen, and the ventricles and sulci are within normal limits in size and symmetry.   No abnormal extra-axial fluid collections or masses are identified.  No significant calvarial abnormality.  IMPRESSION: 1. Negative for bleed or other acute intracranial process.  CT CERVICAL SPINE  Findings: Normal alignment.  No prevertebral soft tissue swelling. There is moderate narrowing of C5-6 and C6-7 interspaces.  Anterior endplate spurring at all levels C3-C7.  Negative for fracture. Uncovertebral spurring results in early foraminal encroachment bilaterally C5-6 and C6-7.  IMPRESSION:  1.  Negative for fracture or other acute bony abnormality. 2.  Multilevel degenerative changes as above.   Original Report Authenticated By: D. Andria Rhein, MD     Assessment/Plan 61 yo male with atyp cp  Principal Problem:  *Chest pain Active Problems:  Coronary artery disease nonobs  Hypertension  Pacemaker  Cover with full dose lovenox, ck vq in am.  Romi.  Tele.  ekg paced.  Cath in 05 with nonobs cad.  Try ntg for cp.  R/o PE.  DAVID,RACHAL A 12/14/2011, 11:27 PM

## 2011-12-14 NOTE — ED Notes (Addendum)
Presents with with sharp sternal chest pain radiates to left breast and into left shoulder, associated with syncope X2 today and SOB.  Left arm is numb. PT has a demand ventricular pacemaker that he states he is allergic too.  Pt reports loss of appetite for two days. Chest pain began Saturday. Pt is tearful. Reports hitting head after syncope today on left parietal side, c/o headache and has history of head injury. PERRLA, answering all questions appropriately. Syncopal episode occurred at 1 pm, pt hit head on carpeted floor from a half sitting position.  C/o neck pain that began before fall. Denies blood thinners.

## 2011-12-14 NOTE — ED Provider Notes (Signed)
History     CSN: [id number]  Arrival date & time 12/14/11  1607   First MD Initiated Contact with Patient 12/14/11 2109      Chief Complaint  Patient presents with  . Chest Pain    (Consider location/radiation/quality/duration/timing/severity/associated sxs/prior treatment) Patient is a 61 y.o. male presenting with chest pain. The history is provided by the patient.  Chest Pain    patient here complaining of substernal chest pain that started 3 days ago. Pain characterized as sharp and lasting minutes to hours and associated with diaphoresis and dyspnea. Pain does radiate to his left arm it makes it known. He's also had syncope x2 when standing. His syncopal episodes have not been associated with his chest pain. He denies any recent fever or chills. According to the patient, he had a heart catheterization back in January of 2005 and there was no intervention done. Patient caught his cardiologist and was told to come here. He does have a history of a pacemaker  Past Medical History  Diagnosis Date  . Coronary artery disease   . Hypertension   . Head injury     Past Surgical History  Procedure Date  . Total shoulder arthroplasty   . Pacemaker insertion     History reviewed. No pertinent family history.  History  Substance Use Topics  . Smoking status: Former Smoker -- 1.0 packs/day for 15 years    Types: Cigarettes    Quit date: 03/15/1996  . Smokeless tobacco: Never Used  . Alcohol Use: No      Review of Systems  Cardiovascular: Positive for chest pain.  All other systems reviewed and are negative.    Allergies  Iodinated diagnostic agents; Soy allergy; and Wheat  Home Medications   Current Outpatient Rx  Name  Route  Sig  Dispense  Refill  . AMLODIPINE-OLMESARTAN 10-40 MG PO TABS   Oral   Take 1 tablet by mouth daily.         . ATENOLOL 25 MG PO TABS   Oral   Take 50 mg by mouth 2 (two) times daily. 2am x times         . BUPROPION HCL ER (XL)  150 MG PO TB24   Oral   Take 150 mg by mouth daily.         [name] LEVOCETIRIZINE DIHYDROCHLORIDE 5 MG PO TABS   Oral   Take 10 mg by mouth every evening.         . OXYCODONE-ACETAMINOPHEN 2.5-325 MG PO TABS   Oral   Take 1 tablet by mouth every 4 (four) hours as needed. pain         . TAMSULOSIN HCL 0.4 MG PO CAPS   Oral   Take by mouth.           BP 167/87  Pulse 65  Temp 97.8 F (36.6 C)  Resp 18  SpO2 99%  Physical Exam  Nursing note and vitals reviewed. Constitutional: He is oriented to person, place, and time. He appears well-developed and well-nourished.  Non-toxic appearance. No distress.  HENT:  Head: Normocephalic and atraumatic.  Eyes: Conjunctivae normal, EOM and lids are normal. Pupils are equal, round, and reactive to light.  Neck: Normal range of motion. Neck supple. No tracheal deviation present. No mass present.  Cardiovascular: Normal rate, regular rhythm and normal heart sounds.  Exam reveals no gallop.   No murmur heard. Pulmonary/Chest: Effort normal and breath sounds normal. No stridor. No respiratory distress. He  has no decreased breath sounds. He has no wheezes. He has no rhonchi. He has no rales.  Abdominal: Soft. Normal appearance and bowel sounds are normal. He exhibits no distension. There is no tenderness. There is no rebound and no CVA tenderness.  Musculoskeletal: Normal range of motion. He exhibits no edema and no tenderness.  Neurological: He is alert and oriented to person, place, and time. He has normal strength. No cranial nerve deficit or sensory deficit. GCS eye subscore is 4. GCS verbal subscore is 5. GCS motor subscore is 6.  Skin: Skin is warm and dry. No abrasion and no rash noted.  Psychiatric: His speech is normal and behavior is normal. His affect is blunt.    ED Course  Procedures (including critical care time)  Labs Reviewed  BASIC METABOLIC PANEL - Abnormal; Notable for the following:    Glucose, Bld 106 (*)     GFR  calc non Af Amer 59 (*)     GFR calc Af Amer 69 (*)     All other components within normal limits  CBC  POCT I-STAT TROPONIN I   Dg Chest 2 View  12/14/2011  *RADIOLOGY REPORT*  Clinical Data: Chest pain.  CHEST - 2 VIEW  Comparison: 08/21/2009.  Findings: The pacer wires are stable.  The cardiac silhouette, mediastinal and hilar contours are normal and stable.  The lungs are clear.  The bony structures are intact.  A right shoulder prosthesis is noted.  IMPRESSION: No acute cardiopulmonary findings.   Original Report Authenticated By: Rudie Meyer, M.D.    Ct Head Wo Contrast  12/14/2011  *RADIOLOGY REPORT*  Clinical Data:  CHEST PAIN. SYNCOPE, BLUNT TRAUMA POST FALL  CT HEAD WITHOUT CONTRAST CT CERVICAL SPINE WITHOUT CONTRAST  Technique:  Multidetector CT imaging of the head and cervical spine was performed following the standard protocol without IV contrast. Multiplanar CT image reconstructions of the cervical spine were also generated.  Comparison:  12/28/2007  CT HEAD  Findings: Stable tentorial and falcine calcifications.  Mild atrophy. There is no evidence of acute intracranial hemorrhage, brain edema, mass lesion, acute infarction,   mass effect, or midline shift. Acute infarct may be inapparent on noncontrast CT. No other intra-axial abnormalities are seen, and the ventricles and sulci are within normal limits in size and symmetry.   No abnormal extra-axial fluid collections or masses are identified.  No significant calvarial abnormality.  IMPRESSION: 1. Negative for bleed or other acute intracranial process.  CT CERVICAL SPINE  Findings: Normal alignment.  No prevertebral soft tissue swelling. There is moderate narrowing of C5-6 and C6-7 interspaces.  Anterior endplate spurring at all levels C3-C7.  Negative for fracture. Uncovertebral spurring results in early foraminal encroachment bilaterally C5-6 and C6-7.  IMPRESSION:  1.  Negative for fracture or other acute bony abnormality. 2.   Multilevel degenerative changes as above.   Original Report Authenticated By: D. Andria Rhein, MD    Ct Cervical Spine Wo Contrast  12/14/2011  *RADIOLOGY REPORT*  Clinical Data:  CHEST PAIN. SYNCOPE, BLUNT TRAUMA POST FALL  CT HEAD WITHOUT CONTRAST CT CERVICAL SPINE WITHOUT CONTRAST  Technique:  Multidetector CT imaging of the head and cervical spine was performed following the standard protocol without IV contrast. Multiplanar CT image reconstructions of the cervical spine were also generated.  Comparison:  12/28/2007  CT HEAD  Findings: Stable tentorial and falcine calcifications.  Mild atrophy. There is no evidence of acute intracranial hemorrhage, brain edema, mass lesion, acute infarction,  mass effect, or midline shift. Acute infarct may be inapparent on noncontrast CT. No other intra-axial abnormalities are seen, and the ventricles and sulci are within normal limits in size and symmetry.   No abnormal extra-axial fluid collections or masses are identified.  No significant calvarial abnormality.  IMPRESSION: 1. Negative for bleed or other acute intracranial process.  CT CERVICAL SPINE  Findings: Normal alignment.  No prevertebral soft tissue swelling. There is moderate narrowing of C5-6 and C6-7 interspaces.  Anterior endplate spurring at all levels C3-C7.  Negative for fracture. Uncovertebral spurring results in early foraminal encroachment bilaterally C5-6 and C6-7.  IMPRESSION:  1.  Negative for fracture or other acute bony abnormality. 2.  Multilevel degenerative changes as above.   Original Report Authenticated By: D. Andria Rhein, MD      No diagnosis found.    MDM   Date: 12/14/2011  Rate: 65  Rhythm: Paced rhythm  QRS Axis: normal  Intervals: normal  ST/T Wave abnormalities: nonspecific ST changes  Conduction Disutrbances:Paced rhythm  Narrative Interpretation:   Old EKG Reviewed: none available  11:03 PM Patient unable to have a chest CT. We'll have a VQ scan. He will be  admitted for observation        Toy Baker, MD 12/14/11 2304

## 2011-12-15 ENCOUNTER — Observation Stay (HOSPITAL_COMMUNITY): Payer: Medicare Other

## 2011-12-15 DIAGNOSIS — R7989 Other specified abnormal findings of blood chemistry: Secondary | ICD-10-CM | POA: Diagnosis present

## 2011-12-15 DIAGNOSIS — R791 Abnormal coagulation profile: Secondary | ICD-10-CM

## 2011-12-15 LAB — TROPONIN I: Troponin I: <0.3 ng/mL (ref <0.30)

## 2011-12-15 MED ORDER — ENOXAPARIN SODIUM 100 MG/ML ~~LOC~~ SOLN
1.0000 mg/kg | Freq: Two times a day (BID) | SUBCUTANEOUS | Status: DC
  Administered 2011-12-15 – 2011-12-16 (×3): 100 mg via SUBCUTANEOUS
  Filled 2011-12-15 (×4): qty 1

## 2011-12-15 MED ORDER — ONDANSETRON HCL 4 MG PO TABS: 4.0000 mg | ORAL_TABLET | Freq: Four times a day (QID) | ORAL | Status: DC | PRN

## 2011-12-15 MED ORDER — ONDANSETRON HCL 4 MG/2ML IJ SOLN
4.0000 mg | Freq: Four times a day (QID) | INTRAMUSCULAR | Status: DC | PRN
  Administered 2011-12-15 (×2): 4 mg via INTRAVENOUS
  Filled 2011-12-15 (×2): qty 2

## 2011-12-15 MED ORDER — TAMSULOSIN HCL 0.4 MG PO CAPS
0.4000 mg | ORAL_CAPSULE | Freq: Every day | ORAL | Status: DC
  Administered 2011-12-15 – 2011-12-18 (×4): 0.4 mg via ORAL
  Filled 2011-12-15 (×4): qty 1

## 2011-12-15 MED ORDER — ASPIRIN EC 81 MG PO TBEC
81.0000 mg | DELAYED_RELEASE_TABLET | Freq: Every day | ORAL | Status: DC
  Administered 2011-12-15 – 2011-12-18 (×4): 81 mg via ORAL
  Filled 2011-12-15 (×4): qty 1

## 2011-12-15 MED ORDER — SODIUM CHLORIDE 0.9 % IJ SOLN: 3.0000 mL | INTRAMUSCULAR | Status: DC | PRN

## 2011-12-15 MED ORDER — SODIUM CHLORIDE 0.9 % IV SOLN
INTRAVENOUS | Status: AC
  Administered 2011-12-15: 02:00:00 via INTRAVENOUS

## 2011-12-15 MED ORDER — SODIUM CHLORIDE 0.9 % IV SOLN: 250.0000 mL | INTRAVENOUS | Status: DC | PRN

## 2011-12-15 MED ORDER — SODIUM CHLORIDE 0.9 % IJ SOLN: 3.0000 mL | Freq: Two times a day (BID) | INTRAMUSCULAR | Status: DC

## 2011-12-15 MED ORDER — ATENOLOL 50 MG PO TABS
50.0000 mg | ORAL_TABLET | Freq: Two times a day (BID) | ORAL | Status: DC
  Administered 2011-12-15 – 2011-12-18 (×7): 50 mg via ORAL
  Filled 2011-12-15 (×8): qty 1

## 2011-12-15 MED ORDER — TECHNETIUM TC 99M DIETHYLENETRIAME-PENTAACETIC ACID: 40.0000 | Freq: Once | INTRAVENOUS | Status: AC | PRN

## 2011-12-15 MED ORDER — TECHNETIUM TO 99M ALBUMIN AGGREGATED
3.0000 | Freq: Once | INTRAVENOUS | Status: AC | PRN
  Administered 2011-12-15: 3 via INTRAVENOUS

## 2011-12-15 MED ORDER — MORPHINE SULFATE 2 MG/ML IJ SOLN
1.0000 mg | INTRAMUSCULAR | Status: DC | PRN
  Administered 2011-12-15 – 2011-12-16 (×4): 1 mg via INTRAVENOUS
  Filled 2011-12-15 (×4): qty 1

## 2011-12-15 MED ORDER — SODIUM CHLORIDE 0.9 % IJ SOLN
3.0000 mL | Freq: Two times a day (BID) | INTRAMUSCULAR | Status: DC
  Administered 2011-12-16 – 2011-12-18 (×4): 3 mL via INTRAVENOUS

## 2011-12-15 MED ORDER — ENOXAPARIN SODIUM 100 MG/ML ~~LOC~~ SOLN
100.0000 mg | Freq: Once | SUBCUTANEOUS | Status: DC
  Filled 2011-12-15: qty 1

## 2011-12-15 NOTE — Progress Notes (Signed)
  Echocardiogram 2D Echocardiogram has been performed.  Aaron Snyder 12/15/2011, 12:34 PM

## 2011-12-15 NOTE — Progress Notes (Signed)
[facility] PROGRESS NOTE  Aaron Snyder RUE:[id number] DOB: 1950-10-12 DOA: 12/14/2011 PCP: Alva Garnet., MD  Assessment/Plan: 1. Positive D dimer - At this point VQ scan low probability for PE - no lower extremities ordered initially during admission.  As such will maintain patient on therapeutic lovenox and assess for DVTs - order BL lower extremities venous dopplers  2. Chest pain - 3 sets of cardiac enzymes negative. - WBC within norma limits. - chest x ray showed no acute cardiopulmonary findings. - Echocardiogram pending - try trial of protonix  3. CAD - Patient is currently on aspirin  4. HTN - relatively well controlled on atenolol - continue to monitor blood pressures and adjust medications pending blood pressure results   Code Status: presumed full code Family Communication: none at bedside Disposition Plan: Pending    Consultants:  None  Procedures:  VQ scan  CT head  CT cervical spine  Antibiotics:  None  HPI/Subjective: No new complaints patient reports that he feels better today.    Objective: Filed Vitals:   12/15/11 0500 12/15/11 0541 12/15/11 0936 12/15/11 1403  BP: 106/72 128/86 122/82 133/87  Pulse: 65 65 69 83  Temp: 98.4 F (36.9 C)   97.7 F (36.5 C)  TempSrc: Oral   Oral  Resp: 18   20  Height:      Weight:      SpO2: 95% 94%  100%    Intake/Output Summary (Last 24 hours) at 12/15/11 1849 Last data filed at 12/15/11 1600  Gross per 24 hour  Intake    600 ml  Output    575 ml  Net     25 ml   Filed Weights   12/15/11 0034  Weight: 98.884 kg (218 lb)    Exam:   General:  Pt in NAD, Alert and Oriented x 3  Cardiovascular: S1 and S2 normal, no rubs  Respiratory: CTA BL, no wheezes  Abdomen: soft, NT  Extremities: no calf discomfort  Data Reviewed: Basic Metabolic Panel:  Lab 12/14/11 1478  NA 137  K 4.4  CL 102  CO2 26  GLUCOSE 106*  BUN 19  CREATININE 1.26  CALCIUM 9.4  MG --    PHOS --   Liver Function Tests: No results found for this basename: AST:5,ALT:5,ALKPHOS:5,BILITOT:5,PROT:5,ALBUMIN:5 in the last 168 hours No results found for this basename: LIPASE:5,AMYLASE:5 in the last 168 hours No results found for this basename: AMMONIA:5 in the last 168 hours CBC:  Lab 12/14/11 1618  WBC 7.5  NEUTROABS --  HGB 14.7  HCT 45.0  MCV 87.5  PLT 193   Cardiac Enzymes:  Lab 12/15/11 1323 12/15/11 0650 12/15/11 0017  CKTOTAL -- -- --  CKMB -- -- --  CKMBINDEX -- -- --  TROPONINI <0.30 <0.30 <0.30   BNP (last 3 results) No results found for this basename: PROBNP:3 in the last 8760 hours CBG: No results found for this basename: GLUCAP:5 in the last 168 hours  No results found for this or any previous visit (from the past 240 hour(s)).   Studies: Dg Chest 2 View  12/14/2011  *RADIOLOGY REPORT*  Clinical Data: Chest pain.  CHEST - 2 VIEW  Comparison: 08/21/2009.  Findings: The pacer wires are stable.  The cardiac silhouette, mediastinal and hilar contours are normal and stable.  The lungs are clear.  The bony structures are intact.  A right shoulder prosthesis is noted.  IMPRESSION: No acute cardiopulmonary findings.   Original Report Authenticated By: P.  Pecolia Ades, M.D.    Ct Head Wo Contrast  12/14/2011  *RADIOLOGY REPORT*  Clinical Data:  CHEST PAIN. SYNCOPE, BLUNT TRAUMA POST FALL  CT HEAD WITHOUT CONTRAST CT CERVICAL SPINE WITHOUT CONTRAST  Technique:  Multidetector CT imaging of the head and cervical spine was performed following the standard protocol without IV contrast. Multiplanar CT image reconstructions of the cervical spine were also generated.  Comparison:  12/28/2007  CT HEAD  Findings: Stable tentorial and falcine calcifications.  Mild atrophy. There is no evidence of acute intracranial hemorrhage, brain edema, mass lesion, acute infarction,   mass effect, or midline shift. Acute infarct may be inapparent on noncontrast CT. No other intra-axial  abnormalities are seen, and the ventricles and sulci are within normal limits in size and symmetry.   No abnormal extra-axial fluid collections or masses are identified.  No significant calvarial abnormality.  IMPRESSION: 1. Negative for bleed or other acute intracranial process.  CT CERVICAL SPINE  Findings: Normal alignment.  No prevertebral soft tissue swelling. There is moderate narrowing of C5-6 and C6-7 interspaces.  Anterior endplate spurring at all levels C3-C7.  Negative for fracture. Uncovertebral spurring results in early foraminal encroachment bilaterally C5-6 and C6-7.  IMPRESSION:  1.  Negative for fracture or other acute bony abnormality. 2.  Multilevel degenerative changes as above.   Original Report Authenticated By: D. Andria Rhein, MD    Ct Cervical Spine Wo Contrast  12/14/2011  *RADIOLOGY REPORT*  Clinical Data:  CHEST PAIN. SYNCOPE, BLUNT TRAUMA POST FALL  CT HEAD WITHOUT CONTRAST CT CERVICAL SPINE WITHOUT CONTRAST  Technique:  Multidetector CT imaging of the head and cervical spine was performed following the standard protocol without IV contrast. Multiplanar CT image reconstructions of the cervical spine were also generated.  Comparison:  12/28/2007  CT HEAD  Findings: Stable tentorial and falcine calcifications.  Mild atrophy. There is no evidence of acute intracranial hemorrhage, brain edema, mass lesion, acute infarction,   mass effect, or midline shift. Acute infarct may be inapparent on noncontrast CT. No other intra-axial abnormalities are seen, and the ventricles and sulci are within normal limits in size and symmetry.   No abnormal extra-axial fluid collections or masses are identified.  No significant calvarial abnormality.  IMPRESSION: 1. Negative for bleed or other acute intracranial process.  CT CERVICAL SPINE  Findings: Normal alignment.  No prevertebral soft tissue swelling. There is moderate narrowing of C5-6 and C6-7 interspaces.  Anterior endplate spurring at all levels  C3-C7.  Negative for fracture. Uncovertebral spurring results in early foraminal encroachment bilaterally C5-6 and C6-7.  IMPRESSION:  1.  Negative for fracture or other acute bony abnormality. 2.  Multilevel degenerative changes as above.   Original Report Authenticated By: D. Andria Rhein, MD    Nm Pulmonary Perf And Vent  12/15/2011  *RADIOLOGY REPORT*  Clinical Data: Shortness of breath  NM PULMONARY VENTILATION AND PERFUSION SCAN  Radiopharmaceutical: CURIE MAA TECHNETIUM TO 81M ALBUMIN AGGREGATED and inhalation of 40 mCi of technetium 99 DTPA  Comparison: Chest x-ray of 12/14/2011  Findings: A rounded perfusion defect overlying the right mid lung represents overlying pacemaker battery pack.  The remainder of the lungs perfuse normally.  On ventilation, no ventilatory defects are evident.  This scan is considered low probability for pulmonary embolism.  IMPRESSION: Low probability of pulmonary embolism.   Original Report Authenticated By: Dwyane Dee, M.D.     Scheduled Meds:   . [COMPLETED] sodium chloride   Intravenous STAT  . aspirin EC  81 mg Oral Daily  . atenolol  50 mg Oral BID  . enoxaparin (LOVENOX) injection  1 mg/kg Subcutaneous Q12H  . [COMPLETED] fentaNYL  50 mcg Intravenous Once  . sodium chloride  3 mL Intravenous Q12H  . sodium chloride  3 mL Intravenous Q12H  . Tamsulosin HCl  0.4 mg Oral Daily  . [COMPLETED] enoxaparin (LOVENOX) injection  100 mg Subcutaneous Once   Continuous Infusions:   Principal Problem:  *Chest pain Active Problems:  Coronary artery disease nonobs  Hypertension  Pacemaker    Time spent: > 35 minutes    Penny Pia  [facility] Pager [phone]. If 8PM-8AM, please contact night-coverage at [email or web address], password [facility] 12/15/2011, 6:49 PM  LOS: 1 day

## 2011-12-15 NOTE — Progress Notes (Signed)
Pt complaining of 5/10 sharp substernal chest pain, worse with inspiration. No EKG changes. BP 106/72, 94% RA.  1mg   IV morphine given. Pain decreased to 3/10.  Craige Cotta NP notified. No new orders. Will continue to monitor.

## 2011-12-16 ENCOUNTER — Encounter (HOSPITAL_COMMUNITY): Payer: Self-pay | Admitting: Physician Assistant

## 2011-12-16 DIAGNOSIS — R9389 Abnormal findings on diagnostic imaging of other specified body structures: Secondary | ICD-10-CM

## 2011-12-16 DIAGNOSIS — R1310 Dysphagia, unspecified: Secondary | ICD-10-CM | POA: Diagnosis present

## 2011-12-16 DIAGNOSIS — Z91041 Radiographic dye allergy status: Secondary | ICD-10-CM | POA: Diagnosis present

## 2011-12-16 DIAGNOSIS — R931 Abnormal findings on diagnostic imaging of heart and coronary circulation: Secondary | ICD-10-CM | POA: Diagnosis present

## 2011-12-16 DIAGNOSIS — I200 Unstable angina: Secondary | ICD-10-CM | POA: Diagnosis present

## 2011-12-16 LAB — CBC
HCT: 46.1 % (ref 39.0–52.0)
Hemoglobin: 15.4 g/dL (ref 13.0–17.0)
MCH: 29.6 pg (ref 26.0–34.0)
MCHC: 33.4 g/dL (ref 30.0–36.0)
MCV: 88.7 fL (ref 78.0–100.0)
Platelets: 175 10*3/uL (ref 150–400)
RBC: 5.2 MIL/uL (ref 4.22–5.81)
RDW: 14.8 % (ref 11.5–15.5)
WBC: 6.9 10*3/uL (ref 4.0–10.5)

## 2011-12-16 LAB — BASIC METABOLIC PANEL
BUN: 15 mg/dL (ref 6–23)
CO2: 26 mEq/L (ref 19–32)
Calcium: 9.1 mg/dL (ref 8.4–10.5)
Chloride: 105 mEq/L (ref 96–112)
Creatinine, Ser: 1.2 mg/dL (ref 0.50–1.35)
GFR calc Af Amer: 73 mL/min — ABNORMAL LOW (ref >90)
GFR calc non Af Amer: 63 mL/min — ABNORMAL LOW (ref >90)
Glucose, Bld: 103 mg/dL — ABNORMAL HIGH (ref 70–99)
Potassium: 4.2 mEq/L (ref 3.5–5.1)
Sodium: 139 mEq/L (ref 135–145)

## 2011-12-16 LAB — PROTIME-INR
INR: 0.98 (ref 0.00–1.49)
Prothrombin Time: 12.9 seconds (ref 11.6–15.2)

## 2011-12-16 MED ORDER — GI COCKTAIL ~~LOC~~
30.0000 mL | Freq: Two times a day (BID) | ORAL | Status: DC | PRN
  Administered 2011-12-17: 30 mL via ORAL
  Filled 2011-12-16: qty 30

## 2011-12-16 MED ORDER — POLYETHYLENE GLYCOL 3350 17 G PO PACK
17.0000 g | PACK | Freq: Every day | ORAL | Status: DC
  Administered 2011-12-16 – 2011-12-18 (×3): 17 g via ORAL
  Filled 2011-12-16 (×3): qty 1

## 2011-12-16 MED ORDER — ACETAMINOPHEN 325 MG PO TABS: 650.0000 mg | ORAL_TABLET | ORAL | Status: DC | PRN

## 2011-12-16 MED ORDER — DICYCLOMINE HCL 20 MG PO TABS
20.0000 mg | ORAL_TABLET | Freq: Three times a day (TID) | ORAL | Status: DC
  Administered 2011-12-16 – 2011-12-18 (×7): 20 mg via ORAL
  Filled 2011-12-16 (×9): qty 1

## 2011-12-16 MED ORDER — SODIUM CHLORIDE 0.9 % IV SOLN
INTRAVENOUS | Status: DC
  Administered 2011-12-17: 06:00:00 via INTRAVENOUS

## 2011-12-16 MED ORDER — PANTOPRAZOLE SODIUM 40 MG PO TBEC
40.0000 mg | DELAYED_RELEASE_TABLET | Freq: Every day | ORAL | Status: DC
  Administered 2011-12-16 – 2011-12-18 (×3): 40 mg via ORAL
  Filled 2011-12-16 (×3): qty 1

## 2011-12-16 NOTE — Progress Notes (Signed)
[facility] PROGRESS NOTE  Aaron Snyder RUE:[id number] DOB: [birth year] DOA: 12/14/2011 PCP: Alva Garnet., MD  Assessment/Plan:  Chest pain Atypical - grabbing pain lasting minutes.  Worse with palpation and inspiration. Consulted [facility] as patient's echo appears to have had a significant change. 3 sets of cardiac enzymes negative. WBC within norma limits. chest x ray showed no acute cardiopulmonary findings. Echocardiogram showed an LVEF 40 - 45% protonix, GI cocktail, and bentyl.    Dysphagia Mild oropharyngeal dysphagia. Previous esophageal dilation by Dr. Chip Boer. Outpatient follow up Could contribute to chest pain.  Positive D dimer VQ scan low probability for PE  CAD Patient is currently on aspirin 81  HTN well controlled on atenolol continue to monitor blood pressures   Code Status: full code Family Communication:  Disposition Plan: To home likely 11/21 pending cards consult recs.   Consultants:  [facility]  Procedures:  VQ scan  CT head  CT cervical spine  Antibiotics:  None  HPI/Subjective: Mentions difficulty swallowing.  Has had recent chest pain.  The pain "grabs" him and lasts for several minutes.  Objective: Filed Vitals:   12/16/11 0500 12/16/11 1031 12/16/11 1039 12/16/11 1040  BP: 114/74 137/77 131/76 130/70  Pulse: 64 65 66 65  Temp: 97.8 F (36.6 C)     TempSrc: Oral     Resp: 18     Height:      Weight:      SpO2: 98% 98%      Intake/Output Summary (Last 24 hours) at 12/16/11 1342 Last data filed at 12/15/11 2100  Gross per 24 hour  Intake    120 ml  Output    975 ml  Net   -855 ml   Filed Weights   12/15/11 0034  Weight: 98.884 kg (218 lb)    Exam:   General:  Pt in NAD, Alert and Oriented x 3, pleasant  Cardiovascular: S1 and S2 normal, no rubs  Respiratory: CTA BL, no wheezes  Abdomen: soft, mild right sided abdominal pain with soft mass in right abdomen.(musculature?)  Extremities: no calf  discomfort  Data Reviewed: Basic Metabolic Panel:  Lab 12/14/11 1478  NA 137  K 4.4  CL 102  CO2 26  GLUCOSE 106*  BUN 19  CREATININE 1.26  CALCIUM 9.4  MG --  PHOS --   CBC:  Lab 12/14/11 1618  WBC 7.5  NEUTROABS --  HGB 14.7  HCT 45.0  MCV 87.5  PLT 193   Cardiac Enzymes:  Lab 12/15/11 1323 12/15/11 0650 12/15/11 0017  CKTOTAL -- -- --  CKMB -- -- --  CKMBINDEX -- -- --  TROPONINI <0.30 <0.30 <0.30   CBG:  Lab 12/16/11 1037  GLUCAP 121*     Studies:  2D echocardiogram - Procedure narrative: Transthoracic echocardiography. Image quality was poor. - Left ventricle: The cavity size was normal. Wall thickness was mild to moderately increased increased. There was mild focal basal hypertrophy of the septum, with an appearance suggesting concentric remodeling (increased wall thickness with normal wall mass). Systolic function was mildly to moderately reduced. The estimated ejection fraction was in the range of 40% to 45%. The study is not technically sufficient to allow evaluation of LV diastolic function. - Regional wall motion abnormality: The image quality and septal bounce from RV pacing made wall motion evaluatoin subuptimal & withreduced sensitivity. There appears to be globally midl hypokinesis with -- Moderate hypokinesis of the basal inferoseptal myocardium; mild hypokinesis of the basal anteroseptal, mid inferoseptal,  basal inferolateral, and apical myocardium.  - Ventricular septum: Septal motion showed "bounce". These changes are consistent with right ventricular pacing.    Dg Chest 2 View  12/14/2011  *RADIOLOGY REPORT*  Clinical Data: Chest pain.  CHEST - 2 VIEW  Comparison: 08/21/2009.  Findings: The pacer wires are stable.  The cardiac silhouette, mediastinal and hilar contours are normal and stable.  The lungs are clear.  The bony structures are intact.  A right shoulder prosthesis is noted.  IMPRESSION: No acute cardiopulmonary findings.    Original Report Authenticated By: Rudie Meyer, M.D.    Ct Head Wo Contrast  12/14/2011  *RADIOLOGY REPORT*  Clinical Data:  CHEST PAIN. SYNCOPE, BLUNT TRAUMA POST FALL  CT HEAD WITHOUT CONTRAST CT CERVICAL SPINE WITHOUT CONTRAST  Technique:  Multidetector CT imaging of the head and cervical spine was performed following the standard protocol without IV contrast. Multiplanar CT image reconstructions of the cervical spine were also generated.  Comparison:  12/28/2007  CT HEAD  Findings: Stable tentorial and falcine calcifications.  Mild atrophy. There is no evidence of acute intracranial hemorrhage, brain edema, mass lesion, acute infarction,   mass effect, or midline shift. Acute infarct may be inapparent on noncontrast CT. No other intra-axial abnormalities are seen, and the ventricles and sulci are within normal limits in size and symmetry.   No abnormal extra-axial fluid collections or masses are identified.  No significant calvarial abnormality.  IMPRESSION: 1. Negative for bleed or other acute intracranial process.  CT CERVICAL SPINE  Findings: Normal alignment.  No prevertebral soft tissue swelling. There is moderate narrowing of C5-6 and C6-7 interspaces.  Anterior endplate spurring at all levels C3-C7.  Negative for fracture. Uncovertebral spurring results in early foraminal encroachment bilaterally C5-6 and C6-7.  IMPRESSION:  1.  Negative for fracture or other acute bony abnormality. 2.  Multilevel degenerative changes as above.   Original Report Authenticated By: D. Andria Rhein, MD    Ct Cervical Spine Wo Contrast  12/14/2011  *RADIOLOGY REPORT*  Clinical Data:  CHEST PAIN. SYNCOPE, BLUNT TRAUMA POST FALL  CT HEAD WITHOUT CONTRAST CT CERVICAL SPINE WITHOUT CONTRAST  Technique:  Multidetector CT imaging of the head and cervical spine was performed following the standard protocol without IV contrast. Multiplanar CT image reconstructions of the cervical spine were also generated.  Comparison:   12/28/2007  CT HEAD  Findings: Stable tentorial and falcine calcifications.  Mild atrophy. There is no evidence of acute intracranial hemorrhage, brain edema, mass lesion, acute infarction,   mass effect, or midline shift. Acute infarct may be inapparent on noncontrast CT. No other intra-axial abnormalities are seen, and the ventricles and sulci are within normal limits in size and symmetry.   No abnormal extra-axial fluid collections or masses are identified.  No significant calvarial abnormality.  IMPRESSION: 1. Negative for bleed or other acute intracranial process.  CT CERVICAL SPINE  Findings: Normal alignment.  No prevertebral soft tissue swelling. There is moderate narrowing of C5-6 and C6-7 interspaces.  Anterior endplate spurring at all levels C3-C7.  Negative for fracture. Uncovertebral spurring results in early foraminal encroachment bilaterally C5-6 and C6-7.  IMPRESSION:  1.  Negative for fracture or other acute bony abnormality. 2.  Multilevel degenerative changes as above.   Original Report Authenticated By: D. Andria Rhein, MD    Nm Pulmonary Perf And Vent  12/15/2011  *RADIOLOGY REPORT*  Clinical Data: Shortness of breath  NM PULMONARY VENTILATION AND PERFUSION SCAN  Radiopharmaceutical: CURIE MAA TECHNETIUM TO  13M ALBUMIN AGGREGATED and inhalation of 40 mCi of technetium 99 DTPA  Comparison: Chest x-ray of 12/14/2011  Findings: A rounded perfusion defect overlying the right mid lung represents overlying pacemaker battery pack.  The remainder of the lungs perfuse normally.  On ventilation, no ventilatory defects are evident.  This scan is considered low probability for pulmonary embolism.  IMPRESSION: Low probability of pulmonary embolism.   Original Report Authenticated By: Dwyane Dee, M.D.     Scheduled Meds:    . [COMPLETED] sodium chloride   Intravenous STAT  . aspirin EC  81 mg Oral Daily  . atenolol  50 mg Oral BID  . dicyclomine  20 mg Oral TID AC  . pantoprazole  40 mg  Oral Daily  . polyethylene glycol  17 g Oral Daily  . sodium chloride  3 mL Intravenous Q12H  . sodium chloride  3 mL Intravenous Q12H  . Tamsulosin HCl  0.4 mg Oral Daily  . [DISCONTINUED] enoxaparin (LOVENOX) injection  1 mg/kg Subcutaneous Q12H   Continuous Infusions:   Principal Problem:  *Chest pain Active Problems:  Coronary artery disease nonobs  Hypertension  Pacemaker  Positive D dimer    Time spent: > 35 minutes    Conley Canal  [facility] Pager [phone]. If 8PM-8AM, please contact night-coverage at [email or web address], password [facility] 12/16/2011, 1:42 PM  LOS: 2 days

## 2011-12-16 NOTE — Progress Notes (Signed)
Patient seen and examined. Agree with note by Algis Downs, PA. Patient admitted for CP. He has ruled out, but an ECHO done as part of his workup reveals an EF of 40-45%. Cardiology has been consulted and we are awaiting their recommendations in regards to further cardiac workup prior to DC.  Peggye Pitt, MD [facility] Pager: [phone]

## 2011-12-16 NOTE — Consult Note (Signed)
Reason for Consult: Chest pain  Requesting Physician: Claybon Jabs  HPI: This is a 61 y.o. male with a past medical history significant for MDT PTVDP in December 2004. He is followed by Dr Alanda Amass and saw him in the office 10/23/11. The pt had no significant CAD at cath in 2005 and a Myoview Jan 2013 was low risk. He had an echo in our office Sept 2013 and this showed an EF of >55%.  He is admitted now with complaints of DOE and SSCP and arm pain. Troponin negative. Echo here shows an EF of 40-45%.   PMHx:  Past Medical History  Diagnosis Date  . Coronary artery disease   . Hypertension   . Head injury   . Dysphagia    Past Surgical History  Procedure Date  . Total shoulder arthroplasty   . Pacemaker insertion     FAMHx: History reviewed. No pertinent family history.  SOCHx:  reports that he quit smoking about 15 years ago. His smoking use included Cigarettes. He has a 15 pack-year smoking history. He has never used smokeless tobacco. He reports that he does not drink alcohol or use illicit drugs.  ALLERGIES: Allergies  Allergen Reactions  . Apple Anaphylaxis  . Carrot (Daucus Carota) Anaphylaxis  . Iodinated Diagnostic Agents Shortness Of Breath and Swelling    Can have contrast as long as he has a 13 prep  . Oat Anaphylaxis  . Soy Allergy Anaphylaxis  . Wheat Anaphylaxis  . Almond Meal Itching    ROS: Pertinent items are noted in HPI.  HOME MEDICATIONS: Prescriptions prior to admission  Medication Sig Dispense Refill  . ALPRAZolam (XANAX) 0.25 MG tablet Take 0.25 mg by mouth 4 (four) times daily as needed. For anxiety      . amLODipine-olmesartan (AZOR) 10-40 MG per tablet Take 1 tablet by mouth daily.      [name] aspirin EC 81 MG tablet Take 81 mg by mouth daily.      [name] atenolol (TENORMIN) 25 MG tablet Take 50 mg by mouth 2 (two) times daily.       . Cholecalciferol (VITAMIN D) 2000 UNITS tablet Take 6,000 Units by mouth daily.      . Cyanocobalamin (VITAMIN B-12 CR)  1500 MCG TBCR Take 3,000 mcg by mouth daily.      [name] loratadine (CLARITIN) 10 MG tablet Take 10 mg by mouth daily.      [name] oxyCODONE-acetaminophen (PERCOCET) 10-325 MG per tablet Take 1 tablet by mouth daily as needed. For pain      . PRESCRIPTION MEDICATION Inject 1 application as directed every other day. Allergy shot  provided by allergist      . Tamsulosin HCl (FLOMAX) 0.4 MG CAPS Take 0.4 mg by mouth daily.         HOSPITAL MEDICATIONS: I have reviewed the patient's current medications.  VITALS: Blood pressure 130/70, pulse 65, temperature 97.8 F (36.6 C), temperature source Oral, resp. rate 18, height 6' (1.829 m), weight 98.884 kg (218 lb), SpO2 98.00%.  PHYSICAL EXAM: General appearance: alert, cooperative and no distress Neck: no carotid bruit and no JVD Lungs: clear to auscultation bilaterally Heart: regular rate and rhythm and Pacemaker RU chest  Abdomen: soft, non-tender; bowel sounds normal; no masses,  no organomegaly Extremities: extremities normal, atraumatic, no cyanosis or edema Pulses: 2+ and symmetric Skin: Skin color, texture, turgor normal. No rashes or lesions Neurologic: Grossly normal  LABS: Results for orders placed during the hospital encounter of 12/14/11 (from  the past 48 hour(s))  CBC     Status: Normal   Collection Time   12/14/11  4:18 PM      Component Value Range Comment   WBC 7.5  4.0 - 10.5 K/uL    RBC 5.14  4.22 - 5.81 MIL/uL    Hemoglobin 14.7  13.0 - 17.0 g/dL    HCT 16.1  09.6 - 04.5 %    MCV 87.5  78.0 - 100.0 fL    MCH 28.6  26.0 - 34.0 pg    MCHC 32.7  30.0 - 36.0 g/dL    RDW 40.9  81.1 - 91.4 %    Platelets 193  150 - 400 K/uL   BASIC METABOLIC PANEL     Status: Abnormal   Collection Time   12/14/11  4:18 PM      Component Value Range Comment   Sodium 137  135 - 145 mEq/L    Potassium 4.4  3.5 - 5.1 mEq/L    Chloride 102  96 - 112 mEq/L    CO2 26  19 - 32 mEq/L    Glucose, Bld 106 (*) 70 - 99 mg/dL    BUN 19  6 - 23 mg/dL     Creatinine, Ser 7.82  0.50 - 1.35 mg/dL    Calcium 9.4  8.4 - 95.6 mg/dL    GFR calc non Af Amer 59 (*) >90 mL/min    GFR calc Af Amer 69 (*) >90 mL/min   POCT I-STAT TROPONIN I     Status: Normal   Collection Time   12/14/11  4:56 PM      Component Value Range Comment   Troponin i, poc 0.01  0.00 - 0.08 ng/mL    Comment 3            D-DIMER, QUANTITATIVE     Status: Abnormal   Collection Time   12/14/11  8:22 PM      Component Value Range Comment   D-Dimer, Quant 0.70 (*) 0.00 - 0.48 ug/mL-FEU   TROPONIN I     Status: Normal   Collection Time   12/15/11 12:17 AM      Component Value Range Comment   Troponin I <0.30  <0.30 ng/mL   TROPONIN I     Status: Normal   Collection Time   12/15/11  6:50 AM      Component Value Range Comment   Troponin I <0.30  <0.30 ng/mL   TROPONIN I     Status: Normal   Collection Time   12/15/11  1:23 PM      Component Value Range Comment   Troponin I <0.30  <0.30 ng/mL   GLUCOSE, CAPILLARY     Status: Abnormal   Collection Time   12/16/11 10:37 AM      Component Value Range Comment   Glucose-Capillary 121 (*) 70 - 99 mg/dL     IMAGING: Dg Chest 2 View  12/14/2011  *RADIOLOGY REPORT*  Clinical Data: Chest pain.  CHEST - 2 VIEW  Comparison: 08/21/2009.  Findings: The pacer wires are stable.  The cardiac silhouette, mediastinal and hilar contours are normal and stable.  The lungs are clear.  The bony structures are intact.  A right shoulder prosthesis is noted.  IMPRESSION: No acute cardiopulmonary findings.   Original Report Authenticated By: Rudie Meyer, M.D.    Ct Head Wo Contrast  12/14/2011  *RADIOLOGY REPORT*  Clinical Data:  CHEST PAIN. SYNCOPE, BLUNT TRAUMA POST FALL  CT HEAD WITHOUT CONTRAST CT CERVICAL SPINE WITHOUT CONTRAST  Technique:  Multidetector CT imaging of the head and cervical spine was performed following the standard protocol without IV contrast. Multiplanar CT image reconstructions of the cervical spine were also generated.   Comparison:  12/28/2007  CT HEAD  Findings: Stable tentorial and falcine calcifications.  Mild atrophy. There is no evidence of acute intracranial hemorrhage, brain edema, mass lesion, acute infarction,   mass effect, or midline shift. Acute infarct may be inapparent on noncontrast CT. No other intra-axial abnormalities are seen, and the ventricles and sulci are within normal limits in size and symmetry.   No abnormal extra-axial fluid collections or masses are identified.  No significant calvarial abnormality.  IMPRESSION: 1. Negative for bleed or other acute intracranial process.  CT CERVICAL SPINE  Findings: Normal alignment.  No prevertebral soft tissue swelling. There is moderate narrowing of C5-6 and C6-7 interspaces.  Anterior endplate spurring at all levels C3-C7.  Negative for fracture. Uncovertebral spurring results in early foraminal encroachment bilaterally C5-6 and C6-7.  IMPRESSION:  1.  Negative for fracture or other acute bony abnormality. 2.  Multilevel degenerative changes as above.   Original Report Authenticated By: D. Andria Rhein, MD    Ct Cervical Spine Wo Contrast  12/14/2011  *RADIOLOGY REPORT*  Clinical Data:  CHEST PAIN. SYNCOPE, BLUNT TRAUMA POST FALL  CT HEAD WITHOUT CONTRAST CT CERVICAL SPINE WITHOUT CONTRAST  Technique:  Multidetector CT imaging of the head and cervical spine was performed following the standard protocol without IV contrast. Multiplanar CT image reconstructions of the cervical spine were also generated.  Comparison:  12/28/2007  CT HEAD  Findings: Stable tentorial and falcine calcifications.  Mild atrophy. There is no evidence of acute intracranial hemorrhage, brain edema, mass lesion, acute infarction,   mass effect, or midline shift. Acute infarct may be inapparent on noncontrast CT. No other intra-axial abnormalities are seen, and the ventricles and sulci are within normal limits in size and symmetry.   No abnormal extra-axial fluid collections or masses are  identified.  No significant calvarial abnormality.  IMPRESSION: 1. Negative for bleed or other acute intracranial process.  CT CERVICAL SPINE  Findings: Normal alignment.  No prevertebral soft tissue swelling. There is moderate narrowing of C5-6 and C6-7 interspaces.  Anterior endplate spurring at all levels C3-C7.  Negative for fracture. Uncovertebral spurring results in early foraminal encroachment bilaterally C5-6 and C6-7.  IMPRESSION:  1.  Negative for fracture or other acute bony abnormality. 2.  Multilevel degenerative changes as above.   Original Report Authenticated By: D. Andria Rhein, MD    Nm Pulmonary Perf And Vent  12/15/2011  *RADIOLOGY REPORT*  Clinical Data: Shortness of breath  NM PULMONARY VENTILATION AND PERFUSION SCAN  Radiopharmaceutical: CURIE MAA TECHNETIUM TO 40M ALBUMIN AGGREGATED and inhalation of 40 mCi of technetium 99 DTPA  Comparison: Chest x-ray of 12/14/2011  Findings: A rounded perfusion defect overlying the right mid lung represents overlying pacemaker battery pack.  The remainder of the lungs perfuse normally.  On ventilation, no ventilatory defects are evident.  This scan is considered low probability for pulmonary embolism.  IMPRESSION: Low probability of pulmonary embolism.   Original Report Authenticated By: Dwyane Dee, M.D.     EKG-V paced  IMPRESSION: Principal Problem:  *Unstable angina, admitted with SSCP and arm pain 12/14/11 Active Problems:  Coronary artery disease nonobs, Jan 2005, Myoview low risk Jan 2013  Abnormal echocardiogram, EF 40-45% 12/15/11   Hypertension  Pacemaker, MDT  placed 12/04 after syncope work up revealed SB/pauses  Positive D dimer, VQ low risk  Dysphagia   RECOMMENDATION:ll  Will discuss with MD. EF drop is new compared with Sept 2013 echo. He wineed cath.  Time Spent Directly with Patient: 35 bminutes  Raymie Giammarco K 12/16/2011, 3:19 PM

## 2011-12-16 NOTE — Progress Notes (Signed)
Pt c/o dizziness and sweating. Checked CBG 121. Took orthostatic BP: Lying - BP 137/77 HR 65, Sitting - BP 131/76 HR 66, Standing - BP 130/70 HR 65.  Pt stated that symptoms started with activity, getting cleaned up in bathroom and had been present for 30 minutes. Educated pt to call early when dizziness starts, and if it continues the rest of the shift.

## 2011-12-16 NOTE — Care Management Note (Signed)
    Page 1 of 1   12/16/2011     11:43:10 AM   CARE MANAGEMENT NOTE 12/16/2011  Patient:  Aaron Snyder, Aaron Snyder   Account Number:  [id number]  Date Initiated:  12/16/2011  Documentation initiated by:  GRAVES-BIGELOW,Tandra Rosado  Subjective/Objective Assessment:   Pt admitted with cp. Plan fo possible d/c today.     Action/Plan:   No needs from CM at this time.   Anticipated DC Date:  12/16/2011   Anticipated DC Plan:  HOME/SELF CARE      DC Planning Services  CM consult      Choice offered to / List presented to:             Status of service:  Completed, signed off Medicare Important Message given?   (If response is "NO", the following Medicare IM given date fields will be blank) Date Medicare IM given:   Date Additional Medicare IM given:    Discharge Disposition:  HOME/SELF CARE  Per UR Regulation:  Reviewed for med. necessity/level of care/duration of stay  If discussed at Long Length of Stay Meetings, dates discussed:    Comments:

## 2011-12-16 NOTE — Consult Note (Signed)
Pt. Seen and examined. Agree with the NP/PA-C note as written.  Pleasant 61 yo male patient of Dr. Alanda Amass with a history of non=obstructive CAD and normal EF in the past. Over the last year he has had increasing fatigue and now chest pain. An NST was performed in 1/13 which showed no ischemia. An echo was recently performed in the office which agrees with the hospital echo, showing a moderately reduced LVEF with global hypokinesis and pacer-related wall motion abnormality. Recent interrogation of the pacer was made with adjustment to pacing and the device should require replacement for ERI in January. Given his newly reduced LVEF, I think we are obligated to do a coronary evaluation.  If there is no significant obstructive disease, then we may need to consider a Bi-V pacing upgrade (if he is frequently v-paced) or simply uptitrate his medical therapy for heart failure.   Thanks for the consult. We will be happy to assume care of the patient on our service if you desire or can follow along with you.  Chrystie Nose, MD, [facility] Attending Cardiologist [facility]

## 2011-12-17 ENCOUNTER — Encounter (HOSPITAL_COMMUNITY): Payer: Self-pay | Admitting: Cardiology

## 2011-12-17 ENCOUNTER — Encounter (HOSPITAL_COMMUNITY)
Admission: EM | Disposition: A | Discharge status: Home / Self Care | Payer: Self-pay | Source: Home / Self Care | Attending: Emergency Medicine

## 2011-12-17 HISTORY — PX: LEFT AND RIGHT HEART CATHETERIZATION WITH CORONARY ANGIOGRAM: SHX5449

## 2011-12-17 LAB — POCT I-STAT 3, ART BLOOD GAS (G3+)
Bicarbonate: 22 mEq/L (ref 20.0–24.0)
TCO2: 23 mmol/L (ref 0–100)
pH, Arterial: 7.329 — ABNORMAL LOW (ref 7.350–7.450)

## 2011-12-17 LAB — POCT I-STAT 3, VENOUS BLOOD GAS (G3P V)
Bicarbonate: 25.2 mEq/L — ABNORMAL HIGH (ref 20.0–24.0)
TCO2: 26 mmol/L (ref 0–100)
pO2, Ven: 38 mmHg (ref 30.0–45.0)

## 2011-12-17 LAB — PRO B NATRIURETIC PEPTIDE: Pro B Natriuretic peptide (BNP): 3166 pg/mL — ABNORMAL HIGH (ref 0–125)

## 2011-12-17 SURGERY — LEFT AND RIGHT HEART CATHETERIZATION WITH CORONARY ANGIOGRAM
Anesthesia: LOCAL

## 2011-12-17 MED ORDER — MIDAZOLAM HCL 2 MG/2ML IJ SOLN
INTRAMUSCULAR | Status: AC
  Filled 2011-12-17: qty 2

## 2011-12-17 MED ORDER — SODIUM CHLORIDE 0.9 % IJ SOLN: 3.0000 mL | INTRAMUSCULAR | Status: DC | PRN

## 2011-12-17 MED ORDER — SODIUM CHLORIDE 0.9 % IJ SOLN: 3.0000 mL | Freq: Two times a day (BID) | INTRAMUSCULAR | Status: DC

## 2011-12-17 MED ORDER — METHYLPREDNISOLONE SODIUM SUCC 125 MG IJ SOLR
125.0000 mg | INTRAMUSCULAR | Status: AC
  Administered 2011-12-17: 125 mg via INTRAVENOUS
  Filled 2011-12-17: qty 2

## 2011-12-17 MED ORDER — HEPARIN (PORCINE) IN NACL 2-0.9 UNIT/ML-% IJ SOLN
INTRAMUSCULAR | Status: AC
  Filled 2011-12-17: qty 1000

## 2011-12-17 MED ORDER — LIDOCAINE HCL (PF) 1 % IJ SOLN
INTRAMUSCULAR | Status: AC
  Filled 2011-12-17: qty 30

## 2011-12-17 MED ORDER — SODIUM CHLORIDE 0.9 % IR SOLN
80.0000 mg | Status: DC
  Filled 2011-12-17: qty 2

## 2011-12-17 MED ORDER — FAMOTIDINE 20 MG PO TABS
20.0000 mg | ORAL_TABLET | ORAL | Status: AC
  Administered 2011-12-17: 20 mg via ORAL
  Filled 2011-12-17: qty 1

## 2011-12-17 MED ORDER — CHLORHEXIDINE GLUCONATE 4 % EX LIQD
CUTANEOUS | Status: AC
  Administered 2011-12-17: 23:00:00
  Filled 2011-12-17: qty 60

## 2011-12-17 MED ORDER — CEFAZOLIN SODIUM-DEXTROSE 2-3 GM-% IV SOLR
2.0000 g | INTRAVENOUS | Status: DC
  Filled 2011-12-17: qty 50

## 2011-12-17 MED ORDER — SODIUM CHLORIDE 0.9 % IR SOLN
80.0000 mg | Freq: Once | Status: DC
  Filled 2011-12-17: qty 2

## 2011-12-17 MED ORDER — NITROGLYCERIN 0.2 MG/ML ON CALL CATH LAB
INTRAVENOUS | Status: AC
  Filled 2011-12-17: qty 1

## 2011-12-17 MED ORDER — FENTANYL CITRATE 0.05 MG/ML IJ SOLN
INTRAMUSCULAR | Status: AC
  Filled 2011-12-17: qty 2

## 2011-12-17 MED ORDER — SODIUM CHLORIDE 0.9 % IV SOLN
1.0000 mL/kg/h | INTRAVENOUS | Status: AC
  Administered 2011-12-17: 1 mL/kg/h via INTRAVENOUS

## 2011-12-17 MED ORDER — DIPHENHYDRAMINE HCL 50 MG/ML IJ SOLN
25.0000 mg | INTRAMUSCULAR | Status: AC
  Administered 2011-12-17: 25 mg via INTRAVENOUS
  Filled 2011-12-17: qty 1

## 2011-12-17 MED ORDER — SODIUM CHLORIDE 0.45 % IV SOLN
INTRAVENOUS | Status: DC
  Administered 2011-12-18: 05:00:00 via INTRAVENOUS

## 2011-12-17 MED ORDER — DIAZEPAM 5 MG PO TABS
5.0000 mg | ORAL_TABLET | ORAL | Status: AC
  Administered 2011-12-17: 5 mg via ORAL
  Filled 2011-12-17: qty 1

## 2011-12-17 MED ORDER — SODIUM CHLORIDE 0.9 % IV SOLN: 250.0000 mL | INTRAVENOUS | Status: DC

## 2011-12-17 NOTE — Progress Notes (Signed)
[facility] DAILY PROGRESS NOTE  NAME:  Aaron Snyder   MRN: [id number] DOB:  Dec 15, 1950   ADMIT DATE: 12/14/2011   Patient Description   61 y.o. male with PMH below: presented with SSCP, troponin negative; VQ scan low risk; Echo with drop in EF from 55% to ~40-45%   Past Medical History  Diagnosis Date  . Coronary artery disease   . Hypertension   . Head injury   . Dysphagia   . Cardiac pacemaker recipient     Clinical Course: Length of Stay:  LOS: 3 days    Subjective:   Today Alben Deeds feels a bit better, but had off & on SSCP overnight. Is lying flat without SOB or CP currently.  Objective:  Temp:  [98.1 F (36.7 C)-98.5 F (36.9 C)] 98.1 F (36.7 C) (11/21 0559) Pulse Rate:  [65-67] 67  (11/21 0559) Resp:  [16] 16  (11/21 0559) BP: (117-137)/(70-78) 117/78 mmHg (11/21 0559) SpO2:  [96 %-98 %] 96 % (11/21 0559) Weight:  [101.833 kg (224 lb 8 oz)] 101.833 kg (224 lb 8 oz) (11/21 0559) Weight change:  Physical Exam: General appearance: alert, cooperative, appears stated age, no distress and normal mood & affect Neck: no adenopathy, no carotid bruit, no JVD and supple, symmetrical, trachea midline Lungs: clear to auscultation bilaterally, normal percussion bilaterally and non-labored Heart: regular rate and rhythm, S1, S2 normal, no murmur, click, rub or gallop Abdomen: soft, non-tender; bowel sounds normal; no masses,  no organomegaly Extremities: extremities normal, atraumatic, no cyanosis or edema Pulses: 2+ and symmetric Neurologic: Mental status: Alert, oriented, thought content appropriate Cranial nerves: normal  Intake/Output from previous day: 11/20 0701 - 11/21 0700 In: 480 [P.O.:480] Out: -   Intake/Output Summary (Last 24 hours) at 12/17/11 0834 Last data filed at 12/16/11 1300  Gross per 24 hour  Intake    480 ml  Output      0 ml  Net    480 ml     Lab 12/16/11 1647 12/14/11 1618  NA 139 137  K 4.2  4.4  CL 105 102  CO2 26 26  BUN 15 19  CREATININE 1.20 1.26     Lab 12/16/11 1647 12/14/11 1618  WBC 6.9 7.5  HGB 15.4 14.7  HCT 46.1 45.0  PLT 175 193   Troponin Neg x 3; BNP 3166*  Imaging:  VQ Scan - low probability;   Echo: Reviewed - EF 40-45% with global hypokinesis, but possible regional WMA.  MAR Reviewed  Assessment/Plan:  Principal Problem:  *Unstable angina, admitted with SSCP and arm pain 12/14/11 Active Problems:  Pacemaker, MDT placed 12/04 after syncope work up revealed SB/pauses  Abnormal echocardiogram, EF 40-45% 12/15/11 (new c/w Sep 2013)  Coronary artery disease nonobstructive, Jan 2005, Myoview low risk Jan 2013  Hypertension  Contrast media allergy  Positive D dimer, VQ low risk  Dysphagia  Per Dr. Blanchie Dessert consult note yesterday: "Given his newly reduced LVEF, I think we are obligated to do a coronary evaluation. If there is no significant obstructive disease, then we may need to consider a Bi-V pacing upgrade (if he is frequently v-paced) or simply uptitrate his medical therapy for heart failure."  Plan L&RHC today to evaluate cause of CP & decreased EF along with filling pressures given increased BNP.  He will be pre-treated for contrast allergy.   CATH CONSENT: Procedure:  Left & Right Heart Catheterization with coronary angiography +/- PCI  The procedure  with Risks/Benefits/Alternatives and Indications was reviewed with the patient.  All questions were answered.    Risks / Complications include, but not limited to: Death, MI, CVA/TIA, VF/VT (with defibrillation), Bradycardia (need for temporary pacer placement), contrast induced nephropathy, bleeding / bruising / hematoma / pseudoaneurysm, vascular or coronary injury (with possible emergent CT or Vascular Surgery), adverse medication reactions, infection.  Contrast hypersensitivity reaction.  The patient voice understanding and agree to proceed.   I have signed the consent form and placed it on  the chart for patient signature and RN witness.     Marykay Lex, M.D., M.S. [facility] [address] [place], [place]  [address]  [phone]  12/17/2011 8:34 AM     Time Spent Directly with Patient:  15 minutes   Broady Lafoy W, M.D., M.S. [facility] [address] [place], [place]  [address]  [phone]  12/17/2011 8:34 AM

## 2011-12-17 NOTE — CV Procedure (Signed)
[facility]     RIGHT AND LEFT CARDIAC CATHETERIZATION REPORT  NAME: Aaron Snyder   MRN: [id number] DOB:  January 24, 1951   ADMIT DATE:  12/14/2011  Performing Cardiologist: Marykay Lex Primary Physician: Alva Garnet., MD Primary Cardiologist:  Faylene Kurtz, MD  Procedure Date:  12/17/2011  Procedures Performed:  Left& Right Heart Catheterization via Right Common Femoral Artery & Vein access  Indication(s): Chest Pain with newly Reduced EF.  History: 61 y.o. male with a past medical history significant for MDT PTVDP in December 2004. He is followed by Dr Alanda Amass and saw him in the office 10/23/11. The pt had no significant CAD at cath in 2005 and a Myoview Jan 2013 was low risk. He had an echo in our office Sept 2013 and this showed an EF of >55%. He is admitted now with complaints of DOE and SSCP and arm pain. Troponin negative. Echo here shows an EF of 40-45%.  Due to newly identified reduced EF in the setting of CP, he was referred for R&LHC.  Consent: This procedure has been fully reviewed with the patient and written informed consent has been obtained.  Procedure:  The patient was brought to the 2nd Floor [facility] in the fasting state and prepped and draped in the usual sterile fashion for Right groin access.  Sterile technique was used including antiseptics, cap, gloves, gown, hand hygiene, mask and sheet.  Skin prep: Chlorhexidine.  Time Out: Verified patient identification, verified procedure, site/side was marked, verified correct patient position, special equipment/implants available, medications/allergies/relevent history reviewed, required imaging and test results available.  Performed  The right femoral head was identified using tactile and fluoroscopic technique.  The right groin was anesthetized with 1% subcutaneous Lidocaine.  First The Right Common Femoral Artery (RFA) then the Right Common  Femoral Vein (RFV) was accessed using the Modified Seldinger Technique with placement a 5 Fr RFA and 7 Fr RFV sheath.  The sheaths were aspirated and flushed.    Right Heart Catheterization Then a 7 Fr Swan Ganz Catheter was advanced through the sheath, and  was advanced with the balloon inflated under fluoroscopic guidance into the first the Right Atrium, then through the Right Ventricle into the Main Pulmonary Artery and into the Wedge position.  Hemodynamics measurements were obtained in each location. Simultaneous Oxygen saturation were recorded in both the Pulmonary Artery and the Central Aorta.  Simultaneous pressure measurements were obtained in the PA/PCWP and RV along with LV.  Thermodilution injections were not performed..    Then the catheter was removed completely out of the body with the balloon deflated.  The Sheath was flushed with heparinized saline.  Left Heart Catheterization First a 5 Fr Pigtail catheter was standard J-wire wire into the ascending Aorta, then advanced across the Aortic Valve.  LV hemodynamics were measured and Left Ventriculography was performed.  LV hemodynamics were then re-sampled, and the catheter was pulled back across the Aortic Valve for measurement of "pull-back" gradient.  Then a 5 Fr JL4 Catheter followed by JR4 was advanced of over and was used to engage the Left then Right coronary artery.  Multiple cineangiographic views of the Both coronary artery system(s) were performed.  An Left Ventriculogram was not performed as an echocardiogram has just been performed & read..  The catheter and wire were removed completely out of the body.  Both sheaths were removed in the Cath Lab PACU Holding area with a manual pressure held for  hemostasis..   The patient was transported to the PACU in hemodynamically stable and chest pain free condition.   The patient  was stable before, during and following the procedure.   Patient did tolerate procedure well. There were  not complications.  EBL: < 15  Medications:  Premedication: 5mg   Valium, 25 mg  Benadryl,  125 mg IV Solumedrol  Sedation:  2 mg IV Versed, 50 IV mcg Fentanyl  Contrast:  50 ml Omnipaque   Right & Left Heart Hemodynamics:  Findings:  SaO2%  Pressures mmHg Mean/EDP mmHg      Right Atrium     11 mmHg mean       Right Ventricle    38/4 mmHg  11 mmHg mean       Pulmonary Artery  70%    40/19 mmHg   28 mmHg EDP       PCWP     19-23 mmHg      Left Ventricle    122/9 mmHg  14 mmHg EDP       Central Aorta   99%    122/80 mmHg  100 mmHg mean    Cardiac Output:  Cardiac Index:       Fick  4.9 L/min    2.19 L/min/BSA     Coronary Angiographic Data:  Left Main:  Large-caliber vessel trifurcates into LAD, Ramus Intermedius and Circumflex.  Angiographically normal  Left Anterior Descending (LAD):  Large-caliber vessel with a sharp turn in the midportion where there is a small area of myocardial bridging. This is just after the a small D2 takeoff. D1 and D2 are both small in caliber. The LAD after the Central "hinge" point with bridging wraps the apex diffuse the inferoapex. Otherwise no notable angiographic evidence of coronary disease.  Circumflex (LCx):  Large-caliber vessel with a very small high OM followed by small second and third OM in the AV groove. The AV groove vessel is very small giving off small posterior lateral branches. The main vessel terminates as a large inferolateral obtuse marginal that courses along the inferolateral border to the apex. The vessel is tortuous in the distal portion of the has minimal luminal irregularities.  Ramus Intermedius:  Moderate caliber vessel that courses along the inferolateral wall. It reaches about two thirds the way to the apex and is angiographically normal.  Right Coronary Artery: Moderate caliber vessel that tapers into relatively small, yet dominant vessel with a very small posterior descending artery and posterior lateral system. There is a  significant RV marginal branch coming off the mid vessel. There is a roughly 40% eccentric stenosis just after the proximal portion of the vessel. Otherwise the arteries angiography normal.  Impression:  No angiographic evidence obstructive epicardial CAD to explain chest pain.  He did have discomfort with the RHC in thr RV.    Mildly elevate RV/PAP with PCWP of 19 mmHg & LEVDP of ~67mmHg  Mildly reduced CO/CI by Fick.    Plan:  EP Consultation for possible BiVPPM upgrade given reduced EF & EOL on current device.  Low EF may be explained by RV pacing.  Pending this consult & plans for upgrade, would otherwise be stable for d/c later today after bed rest.  The case and results was discussed with the patient. The case and results was passed on to the [facility]. The case and results was discussed with the patient's Cardiologist.  Time Spend Directly with Patient:  40 minutes  Etola Mull W, M.D., M.S.  [facility] [address] [place], [place]  [address]  [phone]  12/17/2011 11:02 AM

## 2011-12-17 NOTE — Progress Notes (Signed)
Clarified post cath fluids with Dr. Herbie Baltimore.  He stated patient to get for 2 hours post cath

## 2011-12-17 NOTE — Consult Note (Signed)
ELECTROPHYSIOLOGY CONSULT NOTE   Patient ID: Aaron Snyder MRN: [id number] DOB/AGE: 02/11/1950 61 y.o.  Admit date: 12/14/2011  Primary Physician   Alva Garnet., MD Primary Cardiologist   Alanda Amass  Reason for Consultation   Possible BiV upgrade  Aaron Snyder:Aaron Snyder is a 61 y.o. male with a history of syncope and pacemaker placement in 2005. He came to the hospital with chest pain. His symptoms were concerning for anginal pain and his echo showed a newly decreased EF so he had a cardiac catheterization on 12/17/2011. The full results are below. There was insufficient coronary artery disease to explain his left ventricular dysfunction. He also has mildly elevated right ventricular pressures. Dr. Herbie Baltimore was concerned and recommended EP evaluation for consideration of biventricular upgrade since he is currently nearing end-of-life on his device.  Aaron Snyder is not having any current chest pain. He notes that prior to admission, he was having significant problems with lack of energy and fatigue. This was much worse in the mornings. His weight has been stable with no lower extremity edema and he denies PND or orthopnea. He does not routinely get chest pain. Until the current episode, he had not been having chest pain. He feels he is compliant with his medications and dietary restrictions. He has not had palpitations, presyncope or syncope. He denies any other recent illnesses, fevers or chills. He states Dr. Alanda Amass told him his device would be end-of-life in December but then they had 3 months to replace it.   Past Medical History  Diagnosis Date  . Coronary artery disease   . Hypertension   . Head injury   . Dysphagia   . Cardiac pacemaker recipient secondary to significant bradycardia with long pauses and some junctional rhythms. S/p DDDR Medtronic pulse generator Enpulse model #[id number]; SN: [id number] H with passive fixation __________ atrial and ventricular bipolar Medtronic  steroid eluding electrodes  02/01/2003    DJD       Past Surgical History  Procedure Date  . Total shoulder arthroplasty   . Pacemaker insertion 2005    Medtronic  . Loop recorder implant/explant 2005    Allergies  Allergen Reactions  . Apple Anaphylaxis  . Carrot (Daucus Carota) Anaphylaxis  . Iodinated Diagnostic Agents Shortness Of Breath and Swelling    Can have contrast as long as he has a 13 prep  . Oat Anaphylaxis  . Soy Allergy Anaphylaxis  . Wheat Anaphylaxis  . Almond Meal Itching    I have reviewed the patient's current medications    . aspirin EC  81 mg Oral Daily  . atenolol  50 mg Oral BID  . [COMPLETED] diazepam  5 mg Oral On Call  . dicyclomine  20 mg Oral TID AC  . [COMPLETED] diphenhydrAMINE  25 mg Intravenous On Call  . [COMPLETED] famotidine  20 mg Oral Pre-Cath  . [COMPLETED] fentaNYL      . [COMPLETED] heparin      . [COMPLETED] lidocaine      . [COMPLETED] methylPREDNISolone (SOLU-MEDROL) injection  125 mg Intravenous On Call  . [COMPLETED] midazolam      . [COMPLETED] nitroGLYCERIN      . pantoprazole  40 mg Oral Daily  . polyethylene glycol  17 g Oral Daily  . sodium chloride  3 mL Intravenous Q12H  . sodium chloride  3 mL Intravenous Q12H  . Tamsulosin HCl  0.4 mg Oral Daily  . [DISCONTINUED] sodium chloride  3 mL Intravenous Q12H   . sodium chloride  1 mL/kg/hr (12/17/11 1213)   gi cocktail, morphine injection, nitroGLYCERIN, ondansetron (ZOFRAN) IV, ondansetron  Medication Sig  ALPRAZolam (XANAX) 0.25 MG tablet Take 0.25 mg by mouth 4 (four) times daily as needed. For anxiety  amLODipine-olmesartan 10-40 MG per tablet Take 1 tablet by mouth daily.  aspirin EC 81 MG tablet Take 81 mg by mouth daily.  atenolol (TENORMIN) 25 MG tablet Take 50 mg by mouth 2 (two) times daily.   Cholecalciferol (VITAMIN D) 2000 UNITS tablet Take 6,000 Units by mouth daily.  Cyanocobalamin (VITAMIN B-12 CR) 1500 MCG  Take 3,000 mcg by mouth daily.    loratadine (CLARITIN) 10 MG tablet Take 10 mg by mouth daily.  oxyCODONE-acetaminophen 10-325 MG per tablet Take 1 tablet by mouth daily as needed. For pain  PRESCRIPTION MEDICATION Inject 1 application every other day. Provided by allergist  Tamsulosin HCl (FLOMAX) 0.4 MG CAPS Take 0.4 mg by mouth daily.      History   Social History  . Marital Status: Single    Spouse Name: N/A    Number of Children: N/A  . Years of Education: N/A   Occupational History  . Disabled    Social History Main Topics  . Smoking status: Former Smoker -- 1.0 packs/day for 15 years    Types: Cigarettes    Quit date: 03/15/1996  . Smokeless tobacco: Never Used  . Alcohol Use: No  . Drug Use: No  . Sexually Active: Not on file   Other Topics Concern  . Not on file   Social History Narrative  . No narrative on file    ROS: He has chronic abdominal tenderness but denies melena or other GI symptoms. Full 14 point review of systems complete and found to be negative unless listed above.  Physical Exam: Blood pressure 137/77, pulse 89, temperature 98.1 F (36.7 C), temperature source Oral, resp. rate 16, height 6' (1.829 m), weight 224 lb 8 oz (101.833 kg), SpO2 96.00%.  General: Well developed, well nourished, male in no acute distress Head: Eyes PERRLA, No xanthomas.   Normocephalic and atraumatic, oropharynx without edema or exudate. Dentition: good Lungs: Clear bilaterally Heart: HRRR S1 S2, no rub/gallop, no significant  murmur. pulses are 2+ all 4 extrem.   Neck: No carotid bruits. No lymphadenopathy.  JVD at about 8 cm. Abdomen: Bowel sounds present, abdomen soft and tender in right upper quadrant with some voluntary guarding but no masses or hernias noted. Msk:  No spine or cva tenderness. No weakness, no joint deformities or effusions. Extremities: No clubbing or cyanosis. No edema.  Neuro: Alert and oriented X 3. No focal deficits noted. Psych:  Good affect, responds appropriately Skin: No  rashes or lesions noted.  Labs:   Lab Results  Component Value Date   WBC 6.9 12/16/2011   HGB 15.4 12/16/2011   HCT 46.1 12/16/2011   MCV 88.7 12/16/2011   PLT 175 12/16/2011    Basename 12/16/11 1647  INR 0.98     Lab 12/16/11 1647  NA 139  K 4.2  CL 105  CO2 26  BUN 15  CREATININE 1.20  CALCIUM 9.1  PROT --  BILITOT --  ALKPHOS --  ALT --  AST --  GLUCOSE 103*    Basename 12/15/11 1323 12/15/11 0650 12/15/11 0017  CKTOTAL -- -- --  CKMB -- -- --  TROPONINI <0.30 <0.30 <0.30    Basename 12/14/11 1656  TROPIPOC 0.01   Pro B Natriuretic peptide (BNP)  Date/Time Value Range  Status  12/17/2011  6:35 AM 3166.0* 0 - 125 pg/mL Final   Lab Results  Component Value Date   DDIMER 0.70* 12/14/2011    Cardiac Cath:12/17/2011 Right heart Findings:   SaO2%   Pressures mmHg  Mean/EDP mmHg   Right Atrium     11 mmHg mean   Right Ventricle    38/4 mmHg  11 mmHg mean   Pulmonary Artery  70%   40/19 mmHg  28 mmHg EDP   PCWP     19-23 mmHg   Left Ventricle    122/9 mmHg  14 mmHg EDP   Central Aorta  99%   122/80 mmHg  100 mmHg mean    Cardiac Output:   Cardiac Index:    Fick  4.9 L/min   2.19 L/min/BSA     Coronary Angiographic Data:  Left Main: Large-caliber vessel trifurcates into LAD, Ramus Intermedius and Circumflex. Angiographically normal  Left Anterior Descending (LAD): Large-caliber vessel with a sharp turn in the midportion where there is a small area of myocardial bridging. This is just after the a small D2 takeoff. D1 and D2 are both small in caliber. The LAD after the Central "hinge" point with bridging wraps the apex diffuse the inferoapex. Otherwise no notable angiographic evidence of coronary disease.  Circumflex (LCx): Large-caliber vessel with a very small high OM followed by small second and third OM in the AV groove. The AV groove vessel is very small giving off small posterior lateral branches. The main vessel terminates as a large inferolateral  obtuse marginal that courses along the inferolateral border to the apex. The vessel is tortuous in the distal portion of the has minimal luminal irregularities.  Ramus Intermedius: Moderate caliber vessel that courses along the inferolateral wall. It reaches about two thirds the way to the apex and is angiographically normal.  Right Coronary Artery: Moderate caliber vessel that tapers into relatively small, yet dominant vessel with a very small posterior descending artery and posterior lateral system. There is a significant RV marginal branch coming off the mid vessel. There is a roughly 40% eccentric stenosis just after the proximal portion of the vessel. Otherwise the arteries angiography normal.  Echo: 12/15/2011 Study Conclusions - Procedure narrative: Transthoracic echocardiography. Image quality was poor. - Left ventricle: The cavity size was normal. Wall thickness was mild to moderately increased increased. There was mild focal basal hypertrophy of the septum, with an appearance suggesting concentric remodeling (increased wall thickness with normal wall mass). Systolic function was mildly to moderately reduced. The estimated ejection fraction was in the range of 40% to 45%. The study is not technically sufficient to allow evaluation of LV diastolic function. - Regional wall motion abnormality: The image quality and septal bounce from RV pacing made wall motion evaluatoin subuptimal & withreduced sensitivity. There appears to be globally midl hypokinesis with -- Moderate hypokinesis of the basal inferoseptal myocardium; mild hypokinesis of the basal anteroseptal, mid inferoseptal, basal inferolateral, and apical myocardium. - Ventricular septum: Septal motion showed "bounce". These changes are consistent with right ventricular pacing. Impressions: - A paced rhythm was noted on this study, making this study technically difficult for the assessment of cardiac function.  ECG:   15-Dec-2011 05:19:22   Sinus rhythm (?PACs) with intermittent ventricular pacing LVH Abnormal ECG Previous tracing 100% ventricular pacing Vent. rate 70 BPM PR interval 168 ms QRS duration 94 ms QT/QTc 398/429 ms P-R-T axes 2 -14 -50   Radiology:  Dg Chest 2 View 12/14/2011  *RADIOLOGY REPORT*  Clinical Data: Chest pain.  CHEST - 2 VIEW  Comparison: 08/21/2009.  Findings: The pacer wires are stable.  The cardiac silhouette, mediastinal and hilar contours are normal and stable.  The lungs are clear.  The bony structures are intact.  A right shoulder prosthesis is noted.  IMPRESSION: No acute cardiopulmonary findings.   Original Report Authenticated By: Rudie Meyer, M.D.    Ct Head Wo Contrast 12/14/2011  *RADIOLOGY REPORT*  Clinical Data:  CHEST PAIN. SYNCOPE, BLUNT TRAUMA POST FALL  CT HEAD WITHOUT CONTRAST CT CERVICAL SPINE WITHOUT CONTRAST  Technique:  Multidetector CT imaging of the head and cervical spine was performed following the standard protocol without IV contrast. Multiplanar CT image reconstructions of the cervical spine were also generated.  Comparison:  12/28/2007  CT HEAD  Findings: Stable tentorial and falcine calcifications.  Mild atrophy. There is no evidence of acute intracranial hemorrhage, brain edema, mass lesion, acute infarction,   mass effect, or midline shift. Acute infarct may be inapparent on noncontrast CT. No other intra-axial abnormalities are seen, and the ventricles and sulci are within normal limits in size and symmetry.   No abnormal extra-axial fluid collections or masses are identified.  No significant calvarial abnormality.  IMPRESSION: 1. Negative for bleed or other acute intracranial process.    Ct Cervical Spine Wo Contrast 12/14/2011  *RADIOLOGY REPORT*  Clinical Data:  CHEST PAIN. SYNCOPE, BLUNT TRAUMA POST FALL  CT HEAD WITHOUT CONTRAST CT CERVICAL SPINE WITHOUT CONTRAST  Technique:  Multidetector CT imaging of the head and cervical spine was performed  following the standard protocol without IV contrast. Multiplanar CT image reconstructions of the cervical spine were also generated.  Comparison:  12/28/2007  CT HEAD  Findings: Stable tentorial and falcine calcifications.  Mild atrophy. There is no evidence of acute intracranial hemorrhage, brain edema, mass lesion, acute infarction,   mass effect, or midline shift. Acute infarct may be inapparent on noncontrast CT. No other intra-axial abnormalities are seen, and the ventricles and sulci are within normal limits in size and symmetry.   No abnormal extra-axial fluid collections or masses are identified.  No significant calvarial abnormality.  IMPRESSION: 1. Negative for bleed or other acute intracranial process.  CT CERVICAL SPINE  Findings: Normal alignment.  No prevertebral soft tissue swelling. There is moderate narrowing of C5-6 and C6-7 interspaces.  Anterior endplate spurring at all levels C3-C7.  Negative for fracture. Uncovertebral spurring results in early foraminal encroachment bilaterally C5-6 and C6-7.  IMPRESSION:  1.  Negative for fracture or other acute bony abnormality. 2.  Multilevel degenerative changes as above.   Original Report Authenticated By: D. Andria Rhein, MD    Nm Pulmonary Perf And Vent 12/15/2011  *RADIOLOGY REPORT*  Clinical Data: Shortness of breath  NM PULMONARY VENTILATION AND PERFUSION SCAN  Radiopharmaceutical: CURIE MAA TECHNETIUM TO 43M ALBUMIN AGGREGATED and inhalation of 40 mCi of technetium 99 DTPA  Comparison: Chest x-ray of 12/14/2011  Findings: A rounded perfusion defect overlying the right mid lung represents overlying pacemaker battery pack.  The remainder of the lungs perfuse normally.  On ventilation, no ventilatory defects are evident.  This scan is considered low probability for pulmonary embolism.  IMPRESSION: Low probability of pulmonary embolism.   Original Report Authenticated By: Dwyane Dee, M.D.     ASSESSMENT AND PLAN:   The patient was seen today  by Dr Graciela Husbands, the patient evaluated and the data reviewed.  New LVD - his EF was previously normal. It is currently 40-45%.  Cardiac catheterization today revealed no significant coronary artery disease. T     SignedTheodore Demark 12/17/2011, 1:11 PM Co-Sign MD Device has reverted to VVI 65 causing broad QRS with known normal QRS in may 13.  His intrinsic sinus rate is 53 so with obligatory vvi pacing at 65 no sinus function is seen so either VA conduction and hidden retrograde pwave or possibly afib which might also explain the deterioration a few weeks ago  rec 1) pacer generator replacement.  I suspect with atrial pacing intrinsic conduction would occur antegrade with normalization of the QRS-- if not could consider LV lead placement 2) no indication for ICD 3) no good explanation for loss of LV function but hopefully would normalize over time and I would use a trial tested betablocker now that LV dysfunction apparent  (not atenolol)

## 2011-12-17 NOTE — Progress Notes (Signed)
Appreciate cardiology consultation.  Patient will transition to [facility] service 12/16/12.

## 2011-12-18 ENCOUNTER — Encounter (HOSPITAL_COMMUNITY): Payer: Self-pay | Admitting: Cardiology

## 2011-12-18 ENCOUNTER — Encounter (HOSPITAL_COMMUNITY)
Admission: EM | Disposition: A | Discharge status: Home / Self Care | Payer: Self-pay | Source: Home / Self Care | Attending: Emergency Medicine

## 2011-12-18 DIAGNOSIS — Z950 Presence of cardiac pacemaker: Secondary | ICD-10-CM | POA: Diagnosis not present

## 2011-12-18 DIAGNOSIS — R55 Syncope and collapse: Secondary | ICD-10-CM | POA: Diagnosis present

## 2011-12-18 DIAGNOSIS — Z45010 Encounter for checking and testing of cardiac pacemaker pulse generator [battery]: Secondary | ICD-10-CM

## 2011-12-18 DIAGNOSIS — I498 Other specified cardiac arrhythmias: Secondary | ICD-10-CM

## 2011-12-18 HISTORY — PX: PACEMAKER GENERATOR CHANGE: SHX5998

## 2011-12-18 HISTORY — DX: Encounter for checking and testing of cardiac pacemaker pulse generator (battery): Z45.010

## 2011-12-18 HISTORY — PX: PERMANENT PACEMAKER GENERATOR CHANGE: SHX6022

## 2011-12-18 SURGERY — PACEMAKER GENERATOR CHANGE
Anesthesia: Moderate Sedation | Laterality: Right

## 2011-12-18 SURGERY — PERMANENT PACEMAKER GENERATOR CHANGE
Anesthesia: LOCAL

## 2011-12-18 MED ORDER — MIDAZOLAM HCL 5 MG/5ML IJ SOLN
INTRAMUSCULAR | Status: AC
  Filled 2011-12-18: qty 5

## 2011-12-18 MED ORDER — PANTOPRAZOLE SODIUM 40 MG PO TBEC: 40.0000 mg | DELAYED_RELEASE_TABLET | Freq: Every day | ORAL | Status: DC

## 2011-12-18 MED ORDER — LIDOCAINE HCL (PF) 1 % IJ SOLN
INTRAMUSCULAR | Status: AC
  Filled 2011-12-18: qty 60

## 2011-12-18 MED ORDER — ATENOLOL 25 MG PO TABS: 25.0000 mg | ORAL_TABLET | Freq: Two times a day (BID) | ORAL | Status: DC

## 2011-12-18 MED ORDER — DICYCLOMINE HCL 20 MG PO TABS: 20.0000 mg | ORAL_TABLET | Freq: Three times a day (TID) | ORAL | Status: DC

## 2011-12-18 MED ORDER — POLYETHYLENE GLYCOL 3350 17 G PO PACK: 17.0000 g | PACK | Freq: Every day | ORAL | Status: DC

## 2011-12-18 MED ORDER — FENTANYL CITRATE 0.05 MG/ML IJ SOLN
INTRAMUSCULAR | Status: AC
  Filled 2011-12-18: qty 2

## 2011-12-18 MED ORDER — SODIUM CHLORIDE 0.9 % IR SOLN
Status: DC
  Filled 2011-12-18 (×2): qty 2

## 2011-12-18 MED ORDER — ACETAMINOPHEN 325 MG PO TABS
325.0000 mg | ORAL_TABLET | ORAL | Status: DC | PRN
Start: 2011-12-18 — End: 2011-12-18

## 2011-12-18 MED ORDER — HEPARIN (PORCINE) IN NACL 2-0.9 UNIT/ML-% IJ SOLN
INTRAMUSCULAR | Status: AC
  Filled 2011-12-18: qty 500

## 2011-12-18 MED ORDER — ONDANSETRON HCL 4 MG/2ML IJ SOLN: 4.0000 mg | Freq: Four times a day (QID) | INTRAMUSCULAR | Status: DC | PRN

## 2011-12-18 MED ORDER — ACETAMINOPHEN 325 MG PO TABS: 325.0000 mg | ORAL_TABLET | ORAL | Status: DC | PRN

## 2011-12-18 NOTE — Progress Notes (Signed)
   ELECTROPHYSIOLOGY ROUNDING NOTE    Patient Name: Aaron Snyder Date of Encounter: 12-18-2011    SUBJECTIVE:Patient with persistent intermittent chest pain and shortness of breath.  Catheterization yesterday demonstrated no obstructive CAD, newly decreased EF in the setting of pacemaker being found at [facility] which reverted to VVI pacing at 65.  Plan for pacemaker generator change today to restore AV synchrony.  Pt is moderately concerned about procedure-- states he was difficult to numb with first implant and had significant discomfort.  TELEMETRY: Reviewed telemetry pt in V pacing at 65 Filed Vitals:   12/17/11 1600 12/17/11 1641 12/17/11 2100 12/18/11 0500  BP: 112/68 132/86 133/79 135/62  Pulse: 79 65 65 62  Temp:  98.3 F (36.8 C) 98.3 F (36.8 C) 98.1 F (36.7 C)  TempSrc:  Oral Oral Oral  Resp:   18 18  Height:      Weight:      SpO2:  98% 95% 98%    Intake/Output Summary (Last 24 hours) at 12/18/11 0710 Last data filed at 12/17/11 1850  Gross per 24 hour  Intake 1046.8 ml  Output      0 ml  Net 1046.8 ml   Well appearing middle aged man, NAD HEENT: Unremarkable Neck:  No JVD, no thyromegally Lungs:  Clear with no wheezes, rales, or rhonchi HEART:  Regular rate rhythm, no murmurs, no rubs, no clicks Abd:  Flat, positive bowel sounds, no organomegally, no rebound, no guarding Ext:  2 plus pulses, no edema, no cyanosis, no clubbing Skin:  No rashes no nodules Neuro:  CN II through XII intact, motor grossly intact    LABS: Basic Metabolic Panel:  Basename 12/16/11 1647  NA 139  K 4.2  CL 105  CO2 26  GLUCOSE 103*  BUN 15  CREATININE 1.20  CALCIUM 9.1  MG --  PHOS --   CBC:  Basename 12/16/11 1647  WBC 6.9  NEUTROABS --  HGB 15.4  HCT 46.1  MCV 88.7  PLT 175   Cardiac Enzymes:  Basename 12/15/11 1323  CKTOTAL --  CKMB --  CKMBINDEX --  TROPONINI <0.30    Radiology/Studies: Dg Chest 2 View 12/14/2011  *RADIOLOGY REPORT*  Clinical  Data: Chest pain.  CHEST - 2 VIEW  Comparison: 08/21/2009.  Findings: The pacer wires are stable.  The cardiac silhouette, mediastinal and hilar contours are normal and stable.  The lungs are clear.  The bony structures are intact.  A right shoulder prosthesis is noted.  IMPRESSION: No acute cardiopulmonary findings.   Original Report Authenticated By: Rudie Meyer, M.D.     DEVICE INTERROGATION: Device interrogated by industry.  Found to be at [facility] with reversion to VVI 65.  Prior to reverting, pt V paced <0.1% of the time.   A/P  1. Symptomatic bradycardia 2. HTN 3. New LV dysfunction 4. PPM at [facility] with VVI pacing and a prolonged QRS, previously normal QRS. Rec: For PPM removal and insertion of a new device later today. Would place LV lead only if atrial pacing results in a prolonged QRS which is unlikely.  Leonia Reeves.D.

## 2011-12-18 NOTE — Progress Notes (Signed)
Utilization review completed.  

## 2011-12-18 NOTE — H&P (View-Only) (Signed)
   ELECTROPHYSIOLOGY ROUNDING NOTE    Patient Name: Aaron Snyder Date of Encounter: 12-18-2011    SUBJECTIVE:Patient with persistent intermittent chest pain and shortness of breath.  Catheterization yesterday demonstrated no obstructive CAD, newly decreased EF in the setting of pacemaker being found at [facility] which reverted to VVI pacing at 65.  Plan for pacemaker generator change today to restore AV synchrony.  Pt is moderately concerned about procedure-- states he was difficult to numb with first implant and had significant discomfort.  TELEMETRY: Reviewed telemetry pt in V pacing at 65 Filed Vitals:   12/17/11 1600 12/17/11 1641 12/17/11 2100 12/18/11 0500  BP: 112/68 132/86 133/79 135/62  Pulse: 79 65 65 62  Temp:  98.3 F (36.8 C) 98.3 F (36.8 C) 98.1 F (36.7 C)  TempSrc:  Oral Oral Oral  Resp:   18 18  Height:      Weight:      SpO2:  98% 95% 98%    Intake/Output Summary (Last 24 hours) at 12/18/11 0710 Last data filed at 12/17/11 1850  Gross per 24 hour  Intake 1046.8 ml  Output      0 ml  Net 1046.8 ml   Well appearing middle aged man, NAD HEENT: Unremarkable Neck:  No JVD, no thyromegally Lungs:  Clear with no wheezes, rales, or rhonchi HEART:  Regular rate rhythm, no murmurs, no rubs, no clicks Abd:  Flat, positive bowel sounds, no organomegally, no rebound, no guarding Ext:  2 plus pulses, no edema, no cyanosis, no clubbing Skin:  No rashes no nodules Neuro:  CN II through XII intact, motor grossly intact    LABS: Basic Metabolic Panel:  Basename 12/16/11 1647  NA 139  K 4.2  CL 105  CO2 26  GLUCOSE 103*  BUN 15  CREATININE 1.20  CALCIUM 9.1  MG --  PHOS --   CBC:  Basename 12/16/11 1647  WBC 6.9  NEUTROABS --  HGB 15.4  HCT 46.1  MCV 88.7  PLT 175   Cardiac Enzymes:  Basename 12/15/11 1323  CKTOTAL --  CKMB --  CKMBINDEX --  TROPONINI <0.30    Radiology/Studies: Dg Chest 2 View 12/14/2011  *RADIOLOGY REPORT*  Clinical  Data: Chest pain.  CHEST - 2 VIEW  Comparison: 08/21/2009.  Findings: The pacer wires are stable.  The cardiac silhouette, mediastinal and hilar contours are normal and stable.  The lungs are clear.  The bony structures are intact.  A right shoulder prosthesis is noted.  IMPRESSION: No acute cardiopulmonary findings.   Original Report Authenticated By: P. Gallerani, M.D.     DEVICE INTERROGATION: Device interrogated by industry.  Found to be at [facility] with reversion to VVI 65.  Prior to reverting, pt V paced <0.1% of the time.   A/P  1. Symptomatic bradycardia 2. HTN 3. New LV dysfunction 4. PPM at [facility] with VVI pacing and a prolonged QRS, previously normal QRS. Rec: For PPM removal and insertion of a new device later today. Would place LV lead only if atrial pacing results in a prolonged QRS which is unlikely.  Gregg Taylor,M.D.     

## 2011-12-18 NOTE — Op Note (Signed)
DDD PPM removal and insertion of a new device without immediate complication. D# [id number].

## 2011-12-18 NOTE — Interval H&P Note (Signed)
History and Physical Interval Note:  12/18/2011 11:45 AM  Aaron Snyder  has presented today for surgery, with the diagnosis of End of life  The various methods of treatment have been discussed with the patient and family. After consideration of risks, benefits and other options for treatment, the patient has consented to  Procedure(s) (LRB) with comments: PERMANENT PACEMAKER GENERATOR CHANGE (N/A) as a surgical intervention .  The patient's history has been reviewed, patient examined, no change in status, stable for surgery.  I have reviewed the patient's chart and labs.  Questions were answered to the patient's satisfaction.     Leonia Reeves.D.

## 2011-12-18 NOTE — Progress Notes (Signed)
Subjective: Back from, PPM  New device and removal of old.    Objective: Vital signs in last 24 hours: Temp:  [98.1 F (36.7 C)-98.3 F (36.8 C)] 98.1 F (36.7 C) (11/22 0500) Pulse Rate:  [62-86] 80  (11/22 1257) Resp:  [18] 18  (11/22 0500) BP: (112-135)/(62-86) 127/71 mmHg (11/22 1003) SpO2:  [95 %-98 %] 98 % (11/22 0500) Weight change:  Last BM Date: 12/14/11 Intake/Output from previous day: +1454 11/21 0701 - 11/22 0700 In: 1379.3 [P.O.:960; I.V.:92.5] Out: -  Intake/Output this shift: Total I/O In: 260 [P.O.:260] Out: 575 [Urine:575]  PE: General: Heart: Lungs: Abd: Ext:    Lab Results:  Basename 12/16/11 1647  WBC 6.9  HGB 15.4  HCT 46.1  PLT 175   BMET  Basename 12/16/11 1647  NA 139  K 4.2  CL 105  CO2 26  GLUCOSE 103*  BUN 15  CREATININE 1.20  CALCIUM 9.1    Studies/Results: No results found.  Medications: I have reviewed the patient's current medications.    [name] aspirin EC  81 mg Oral Daily  . atenolol  50 mg Oral BID  . [COMPLETED] chlorhexidine      . dicyclomine  20 mg Oral TID AC  . [COMPLETED] fentaNYL      . [COMPLETED] heparin      . [COMPLETED] lidocaine      . [COMPLETED] midazolam      . [COMPLETED] midazolam      . pantoprazole  40 mg Oral Daily  . polyethylene glycol  17 g Oral Daily  . sodium chloride  3 mL Intravenous Q12H  . Tamsulosin HCl  0.4 mg Oral Daily  . [DISCONTINUED]  ceFAZolin (ANCEF) IV  2 g Intravenous On Call  . [DISCONTINUED]  ceFAZolin (ANCEF) IV  2 g Intravenous On Call  . [DISCONTINUED] gentamicin irrigation  80 mg Irrigation On Call  . [DISCONTINUED] gentamicin irrigation  80 mg Irrigation Once  . [DISCONTINUED] gentamicin irrigation   Irrigation To Cath  . [DISCONTINUED] sodium chloride  3 mL Intravenous Q12H   Assessment/Plan: Principal Problem:  *Unstable angina, admitted with SSCP and arm pain 12/14/11, neg MI, no obstructive CAD by cath 12/17/11 Active Problems:  Coronary artery disease  nonobstructive, Jan 2005, Myoview low risk Jan 2013  Hypertension  Pacemaker, MDT placed 12/04 after syncope work up revealed SB/pauses, now [facility] with conversion to VVI pacing  Positive D dimer, VQ low risk  Dysphagia  Abnormal echocardiogram, EF 40-45% 12/15/11 (new c/w Sep 2013)  Contrast media allergy  S/P cardiac pacemaker procedure, 12/18/11, new generator  PLAN:  Stable post procedure, cardiac cath done 12/17/11 No angiographic evidence obstructive epicardial CAD to explain chest pain. He did have discomfort with the RHC in thr RV.  Mildly elevate RV/PAP with PCWP of 19 mmHg & LEVDP of ~56mmHg  Mildly reduced CO/CI by Fick.   Possible d/c tomorrow if can ambulate without complication.    LOS: 4 days   Aaron Snyder R 12/18/2011, 2:43 PM

## 2011-12-18 NOTE — Discharge Summary (Signed)
Physician Discharge Summary  Patient ID: Aaron Snyder MRN: [id number] DOB/AGE: 05/14/1950 61 y.o.  Admit date: 12/14/2011 Discharge date: 12/18/2011  Discharge Diagnoses:  Principal Problem:  *Unstable angina, admitted with SSCP and arm pain 12/14/11, neg MI, no obstructive CAD by cath 12/17/11 Active Problems:  Coronary artery disease nonobstructive, Jan 2005, Myoview low risk Jan 2013  Hypertension  Pacemaker, MDT placed 12/04 after syncope work up revealed SB/pauses, now ERI with conversion to VVI pacing  Positive D dimer, VQ low risk  Dysphagia  Abnormal echocardiogram, EF 40-45% 12/15/11 (new c/w Sep 2013)  Contrast media allergy  S/P cardiac pacemaker procedure, 12/18/11, new generator  Syncope X 3 per the pt.  PROCEDURES: 12/17/11 cardiac cath by Dr. Herbie Baltimore  12/18/11 explantation of previous Medtronic device and implantation of new generator by Dr. Sharrell Ku.  Discharged Condition: good  Hospital Course:  61 y.o. male with a past medical history significant for MDT PTVDP in December 2004. He is followed by Dr Alanda Amass, who saw him in the office 10/23/11. The pt had no significant CAD at cath in 2005 and a Myoview Jan 2013 was low risk. He had an echo in our office Sept 2013 and this showed an EF of >55%. He is admitted now with complaints of DOE and SSCP and arm pain. Troponin negative. Echo here shows an EF of 40-45%.   His pacemaker was is ERI, on interagation and had reverted to VVI mode.  (on exam in Sept. 2013 generator had 4-6 months of longevity left) --Pt had also complained of syncope to ER triage RN, cervical films were done and found to be negative.  Pt had negative cardiac enzymes, but with abnormal EF he underwent cardiac cath 12/17/11. Coronary Angiographic Data:  Left Main: Large-caliber vessel trifurcates into LAD, Ramus Intermedius and Circumflex. Angiographically normal  Left Anterior Descending (LAD): Large-caliber vessel with a sharp turn in the  midportion where there is a small area of myocardial bridging. This is just after the a small D2 takeoff. D1 and D2 are both small in caliber. The LAD after the Central "hinge" point with bridging wraps the apex diffuse the inferoapex. Otherwise no notable angiographic evidence of coronary disease.  Circumflex (LCx): Large-caliber vessel with a very small high OM followed by small second and third OM in the AV groove. The AV groove vessel is very small giving off small posterior lateral branches. The main vessel terminates as a large inferolateral obtuse marginal that courses along the inferolateral border to the apex. The vessel is tortuous in the distal portion of the has minimal luminal irregularities.  Ramus Intermedius: Moderate caliber vessel that courses along the inferolateral wall. It reaches about two thirds the way to the apex and is angiographically normal.  Right Coronary Artery: Moderate caliber vessel that tapers into relatively small, yet dominant vessel with a very small posterior descending artery and posterior lateral system. There is a significant RV marginal branch coming off the mid vessel. There is a roughly 40% eccentric stenosis just after the proximal portion of the vessel. Otherwise the arteries angiography normal. Impression:  No angiographic evidence obstructive epicardial CAD to explain chest pain. He did have discomfort with the RHC in thr RV.  Mildly elevate RV/PAP with PCWP of 19 mmHg & LEVDP of ~3mmHg  Mildly reduced CO/CI by Fick.    Dr. Graciela Husbands felt that "no good explanation for loss of LV function but hopefully would normalize over time and I would use a trial tested betablocker now  that LV dysfunction apparent".  Pt underwent PPM generator change, 12/18/11 by Dr. Ladona Ridgel.  Please see his dictated report concerning type of devise.  Pt was stable later on the day of devise change and he was discharged home.  No further chest pain or any complaints.  Protonix had been  added for nausea and GI complaints.  Pt's atenolol was decreased to 25 mg BID and we will leave to Dr. Alanda Amass to change to another BB and to restart the pt's AZOR which had also been held, secondary to concern for hypotension causing syncope,  this may need to be restart as an outpatient.     Consults: cardiology--EP with Dr. Graciela Husbands and Dr. Ladona Ridgel  Significant Diagnostic Studies:  BMET    Component Value Date/Time   NA 139 12/16/2011 1647   K 4.2 12/16/2011 1647   CL 105 12/16/2011 1647   CO2 26 12/16/2011 1647   GLUCOSE 103* 12/16/2011 1647   BUN 15 12/16/2011 1647   CREATININE 1.20 12/16/2011 1647   CALCIUM 9.1 12/16/2011 1647   GFRNONAA 63* 12/16/2011 1647   GFRAA 73* 12/16/2011 1647    CBC    Component Value Date/Time   WBC 6.9 12/16/2011 1647   RBC 5.20 12/16/2011 1647   HGB 15.4 12/16/2011 1647   HCT 46.1 12/16/2011 1647   PLT 175 12/16/2011 1647   MCV 88.7 12/16/2011 1647   MCH 29.6 12/16/2011 1647   MCHC 33.4 12/16/2011 1647   RDW 14.8 12/16/2011 1647   LYMPHSABS 1.4 05/29/2011 1719   MONOABS 0.7 05/29/2011 1719   EOSABS 0.1 05/29/2011 1719   BASOSABS 0.0 05/29/2011 1719   CT HEAD  Findings: Stable tentorial and falcine calcifications. Mild  atrophy. There is no evidence of acute intracranial hemorrhage,  brain edema, mass lesion, acute infarction, mass effect, or  midline shift. Acute infarct may be inapparent on noncontrast CT.  No other intra-axial abnormalities are seen, and the ventricles and  sulci are within normal limits in size and symmetry. No abnormal  extra-axial fluid collections or masses are identified. No  significant calvarial abnormality.  IMPRESSION:  1. Negative for bleed or other acute intracranial process.  CT CERVICAL SPINE  Findings: Normal alignment. No prevertebral soft tissue swelling.  There is moderate narrowing of C5-6 and C6-7 interspaces. Anterior  endplate spurring at all levels C3-C7. Negative for fracture.  Uncovertebral  spurring results in early foraminal encroachment  bilaterally C5-6 and C6-7.  IMPRESSION:  1. Negative for fracture or other acute bony abnormality.  2. Multilevel degenerative changes as above.    2D ECHO, 12/15/11: Study Conclusions  - Procedure narrative: Transthoracic echocardiography. Image quality was poor. - Left ventricle: The cavity size was normal. Wall thickness was mild to moderately increased increased. There was mild focal basal hypertrophy of the septum, with an appearance suggesting concentric remodeling (increased wall thickness with normal wall mass). Systolic function was mildly to moderately reduced. The estimated ejection fraction was in the range of 40% to 45%. The study is not technically sufficient to allow evaluation of LV diastolic function. - Regional wall motion abnormality: The image quality and septal bounce from RV pacing made wall motion evaluatoin subuptimal & withreduced sensitivity. There appears to be globally midl hypokinesis with -- Moderate hypokinesis of the basal inferoseptal myocardium; mild hypokinesis of the basal anteroseptal, mid inferoseptal, basal inferolateral, and apical myocardium. - Ventricular septum: Septal motion showed "bounce". These changes are consistent with right ventricular pacing. Impressions:  - A paced rhythm was noted  on this study, making this study technically difficult for the assessment of cardiac function.  ------------------------------------------------------------   Discharge Exam: Blood pressure 114/69, pulse 72, temperature 98 F (36.7 C), temperature source Oral, resp. rate 17, height 6' (1.829 m), weight 101.833 kg (224 lb 8 oz), SpO2 94.00%.   General appearance: alert, cooperative, appears stated age and no distress  Neck: no adenopathy, no carotid bruit, no JVD and supple, symmetrical, trachea midline  Lungs: clear to auscultation bilaterally, normal percussion bilaterally and non-labored    Heart: regular rate and rhythm, S1, S2 normal, no murmur, click, rub or gallop  Abdomen: soft, non-tender; bowel sounds normal; no masses, no organomegaly  Extremities: extremities normal, atraumatic, no cyanosis or edema  Neurologic: Grossly normal     Disposition: 01-Home or Self Care     Medication List     As of 12/18/2011  6:07 PM    STOP taking these medications         AZOR 10-40 MG per tablet   Generic drug: amLODipine-olmesartan      TAKE these medications         acetaminophen 325 MG tablet   Commonly known as: TYLENOL   Take 1-2 tablets (325-650 mg total) by mouth every 4 (four) hours as needed.      ALPRAZolam 0.25 MG tablet   Commonly known as: XANAX   Take 0.25 mg by mouth 4 (four) times daily as needed. For anxiety      aspirin EC 81 MG tablet   Take 81 mg by mouth daily.      atenolol 25 MG tablet   Commonly known as: TENORMIN   Take 1 tablet (25 mg total) by mouth 2 (two) times daily.      loratadine 10 MG tablet   Commonly known as: CLARITIN   Take 10 mg by mouth daily.      oxyCODONE-acetaminophen 10-325 MG per tablet   Commonly known as: PERCOCET   Take 1 tablet by mouth daily as needed. For pain      pantoprazole 40 MG tablet   Commonly known as: PROTONIX   Take 1 tablet (40 mg total) by mouth daily.      polyethylene glycol packet   Commonly known as: MIRALAX / GLYCOLAX   Take 17 g by mouth daily.      PRESCRIPTION MEDICATION   Inject 1 application as directed every other day. Allergy shot  provided by allergist      Tamsulosin HCl 0.4 MG Caps   Commonly known as: FLOMAX   Take 0.4 mg by mouth daily.      Vitamin B-12 CR 1500 MCG Tbcr   Take 3,000 mcg by mouth daily.      Vitamin D 2000 UNITS tablet   Take 6,000 Units by mouth daily.        Follow-up Information    Follow up with Governor Rooks, MD. On 12/29/2011. (at 8:30 am)    Contact information:   [address] [place]  [address] [phone]        DISCHARGE INSTRUCTIONS: Supplemental post pacemaker instructions given to the pt.  This included when to remove dressings.  SignedLeone Brand 12/18/2011, 6:07 PM

## 2011-12-18 NOTE — Progress Notes (Signed)
I seen and evaluated the patient this morning along with Nada Boozer, NP. I agree with her findings, examination as well as impression recommendations.  General appearance: alert, cooperative, appears stated age and no distress Neck: no adenopathy, no carotid bruit, no JVD and supple, symmetrical, trachea midline Lungs: clear to auscultation bilaterally, normal percussion bilaterally and non-labored Heart: regular rate and rhythm, S1, S2 normal, no murmur, click, rub or gallop Abdomen: soft, non-tender; bowel sounds normal; no masses,  no organomegaly Extremities: extremities normal, atraumatic, no cyanosis or edema Neurologic: Grossly normal  New PPM generator placed -- per Dr. Ladona Ridgel could potentially d/c as early as this PM. Need to ensure ambulating without difficulty -- SOB or CP.  As no new lead placed, no need to f/u CXR in AM.  Anticipate d/c later this PM or in AM tomorrow.  Marykay Lex, M.D., M.S. [facility] [address] [place], [place]  [address]  [phone] Pager # [phone] 12/18/2011 2:52 PM

## 2011-12-19 NOTE — Op Note (Signed)
NAMEARNIS, Aaron Snyder             ACCOUNT NO.:  [id number]  MEDICAL RECORD NO.:  [id number]  LOCATION:  [id number]                        FACILITY:  [facility]  PHYSICIAN:  Doylene Canning. Ladona Ridgel, MD    DATE OF BIRTH:  1950/03/04  DATE OF PROCEDURE:  12/18/2011 DATE OF DISCHARGE:  12/18/2011                              OPERATIVE REPORT   PROCEDURE PERFORMED:  Removal of a previously implanted permanent pacemaker, which reached elective replacement and insertion of a new dual-chamber pacemaker in a patient with symptomatic bradycardia.  INTRODUCTION:  The patient is a 61 year old man who came to the hospital with worsening heart failure symptoms after his pacemaker had reverted to the VVI pacing mode.  He is now referred for removal of his old device and insertion of a new one.  PROCEDURE:  After informed consent was obtained, the patient was taken to the Diagnostic EP Lab in the fasting state.  After usual preparation and draping, intravenous fentanyl and midazolam was given for sedation. A 30 mL of lidocaine was infiltrated into the right infraclavicular region.  A 5-cm incision was carried out over this region, and electrocautery was utilized to dissect down to the fascial plane.  The pacemaker pocket was entered with electrocautery.  The generator was removed with gentle traction.  The leads were evaluated and found to be working satisfactorily.  The P-waves were 6, the R-waves were 10, the impedance was 400 in the atrium and 500 in the ventricle.  Threshold of 0.3 at 0.5 in the atrium and 0.7 at 0.5 in the right ventricle.  With these satisfactory parameters, the new Medtronic Adapta L dual-chamber pacemaker, serial number [id number] was connected to the atrial and the RV leads and placed back in the subcutaneous pocket.  The pocket was irrigated with antibiotic irrigation and incision closed with 2-0 and 3- 0 Vicryl.  Benzoin and Steri-Strips were painted on the skin, a pressure dressing  was applied, and the patient was returned to his room in satisfactory condition.  COMPLICATIONS:  There were no immediate procedure complications.  RESULTS:  This demonstrates successful implantation of a Medtronic dual- chamber pacemaker after removal of a previously implanted dual-chamber pacemaker without immediate procedure complication.     Doylene Canning. Ladona Ridgel, MD     GWT/MEDQ  D:  12/18/2011  T:  12/19/2011  Job:  [id number]  cc:   Thurmon Fair, MD

## 2011-12-19 NOTE — Discharge Summary (Signed)
I agree with the discharge summary.  He was stable following generator change out.  Marykay Lex, M.D., M.S. [facility] [address] [place], [place]  [address]  [phone] Pager # [phone] 12/19/2011 11:51 AM

## 2011-12-29 ENCOUNTER — Other Ambulatory Visit (HOSPITAL_COMMUNITY): Payer: Self-pay | Admitting: Cardiovascular Disease

## 2011-12-29 DIAGNOSIS — I82409 Acute embolism and thrombosis of unspecified deep veins of unspecified lower extremity: Secondary | ICD-10-CM

## 2012-01-06 ENCOUNTER — Ambulatory Visit (HOSPITAL_COMMUNITY)
Admission: RE | Admit: 2012-01-06 | Discharge: 2012-01-06 | Disposition: A | Discharge status: Home / Self Care | Payer: Medicare Other | Source: Ambulatory Visit | Attending: Cardiovascular Disease | Admitting: Cardiovascular Disease

## 2012-01-06 DIAGNOSIS — I82409 Acute embolism and thrombosis of unspecified deep veins of unspecified lower extremity: Secondary | ICD-10-CM | POA: Insufficient documentation

## 2012-01-06 NOTE — Progress Notes (Signed)
BLE venous duplex completed. Harmoney Sienkiewicz D  

## 2012-11-29 ENCOUNTER — Telehealth: Payer: Self-pay | Admitting: Internal Medicine

## 2012-11-29 ENCOUNTER — Encounter: Payer: Self-pay | Admitting: Cardiovascular Disease

## 2012-11-29 ENCOUNTER — Encounter: Payer: Self-pay | Admitting: *Deleted

## 2012-11-29 NOTE — Telephone Encounter (Signed)
Has appt with Dr C 12-01-12/mt

## 2012-12-01 ENCOUNTER — Ambulatory Visit (INDEPENDENT_AMBULATORY_CARE_PROVIDER_SITE_OTHER): Payer: Medicare Other | Admitting: Cardiovascular Disease

## 2012-12-01 ENCOUNTER — Encounter: Payer: Self-pay | Admitting: Cardiovascular Disease

## 2012-12-01 VITALS — BP 156/94 | HR 72 | Ht 72.0 in | Wt 217.8 lb

## 2012-12-01 DIAGNOSIS — I10 Essential (primary) hypertension: Secondary | ICD-10-CM

## 2012-12-01 DIAGNOSIS — I495 Sick sinus syndrome: Secondary | ICD-10-CM

## 2012-12-01 DIAGNOSIS — I2510 Atherosclerotic heart disease of native coronary artery without angina pectoris: Secondary | ICD-10-CM

## 2012-12-01 DIAGNOSIS — R079 Chest pain, unspecified: Secondary | ICD-10-CM

## 2012-12-01 DIAGNOSIS — Z950 Presence of cardiac pacemaker: Secondary | ICD-10-CM

## 2012-12-01 LAB — PACEMAKER DEVICE OBSERVATION

## 2012-12-01 MED ORDER — PANTOPRAZOLE SODIUM 40 MG PO TBEC: 40.0000 mg | DELAYED_RELEASE_TABLET | Freq: Every day | ORAL | Status: DC

## 2012-12-01 MED ORDER — OLMESARTAN-AMLODIPINE-HCTZ 40-10-25 MG PO TABS: 1.0000 | ORAL_TABLET | Freq: Every day | ORAL | Status: DC

## 2012-12-01 NOTE — Patient Instructions (Addendum)
Remote monitoring is used to monitor your pacemaker from home. This monitoring reduces the number of office visits required to check your device to one time per year. It allows [place] to keep an eye on the functioning of your device to ensure it is working properly. You are scheduled for a device check from home on 03-06-2013. You may send your transmission at any time that day. If you have a wireless device, the transmission will be sent automatically. After your physician reviews your transmission, you will receive a postcard with your next transmission date.  STOP Amlodipine/olmesartan.  START Tribenzor 40/10/25mg .  Your physician recommends that you schedule a follow-up appointment in: 6 months.

## 2012-12-07 DIAGNOSIS — I10 Essential (primary) hypertension: Secondary | ICD-10-CM | POA: Insufficient documentation

## 2012-12-07 NOTE — Assessment & Plan Note (Signed)
He does not have any symptoms to suggest active coronary insufficiency. In fact he is physically very active without restriction from chest discomfort. Suspect noncardiac cause of occasional chest discomfort.

## 2012-12-07 NOTE — Assessment & Plan Note (Addendum)
His blood pressure was very high today but he reports that this is a fairly typical value. I replaced his Azor or with tribenzor 10/40/25 mg daily

## 2012-12-07 NOTE — Assessment & Plan Note (Signed)
Comprehensive device check shows normal function. Atrial and ventricular lead parameters are excellent. There is 96% atrial pacing and virtually no ventricular pacing. 3 extremely brief episodes of mode switch her recorded 2 brief episodes of nonsustained ventricular tachycardia each 6 seconds in duration. Expected generator longevity greater than 11 years. The heart rate distribution histogram is strongly suggestive of chronotropic incompetence with insufficient sensor intervention. Decreased activity threshold to "low". Reassess after 3-6 months.

## 2012-12-07 NOTE — Progress Notes (Signed)
Patient ID: Aaron Snyder, male   DOB: 06-05-1950, 62 y.o.   MRN: [id number]      Reason for office visit Pacemaker, chest pain, hypertension  Aaron Snyder was formerly a patient of Dr. Kandis Cocking, until his recent retirement. He initially received a pacemaker in 2005 after several episodes of syncope and documented sinus pauses on a loop recorder. At generator change out was performed for ER I last year. He has treated systemic hypertension and a history of head injury and cervical spine problems. Chest pain in the past has led to coronary angiography which showed minor plaque without any meaningful stenoses. Note that at that time (2005) there was no evidence of renal artery stenosis but his blood pressure was described as severely elevated.  His only complaint is of fatigue. Despite this she exercises at the gym 5 days a week. He performs one hour of cardio exercise and 20-30 minutes of weightlifting. He does this every morning and then he walks 2.5-3 miles every evening.  He has occasional chest discomfort but this always occurs at rest and activity he denies other cardiovascular complaints   Allergies  Allergen Reactions  . Apple Anaphylaxis  . Carrot [Daucus Carota] Anaphylaxis  . Iodinated Diagnostic Agents Shortness Of Breath and Swelling    Can have contrast as long as he has a 13 prep  . Oat Anaphylaxis  . Soy Allergy Anaphylaxis  . Wheat Anaphylaxis  . Almond Meal Itching    Current Outpatient Prescriptions  Medication Sig Dispense Refill  . acetaminophen (TYLENOL) 325 MG tablet Take 1-2 tablets (325-650 mg total) by mouth every 4 (four) hours as needed.  2 tablet    . atenolol (TENORMIN) 25 MG tablet Take 50 mg by mouth 2 (two) times daily.      . Cholecalciferol (VITAMIN D) 2000 UNITS tablet Take 6,000 Units by mouth daily.      . Cyanocobalamin (VITAMIN B-12 CR) 1500 MCG TBCR Take 3,000 mcg by mouth daily.      . fish oil-omega-3 fatty acids 1000 MG capsule Take 2 g  by mouth daily.      [name] loratadine (CLARITIN) 10 MG tablet Take 10 mg by mouth daily.      [name] oxyCODONE-acetaminophen (PERCOCET) 10-325 MG per tablet Take 1 tablet by mouth daily as needed. For pain      . pantoprazole (PROTONIX) 40 MG tablet Take 1 tablet (40 mg total) by mouth daily.  30 tablet  5  . PRESCRIPTION MEDICATION Inject 1 application as directed every other day. Allergy shot  provided by allergist      . Vortioxetine HBr (BRINTELLIX) 10 MG TABS Take 10 mg by mouth daily.      . diazepam (VALIUM) 5 MG tablet Take 5 mg by mouth as needed.      . Olmesartan-Amlodipine-HCTZ 40-10-25 MG TABS Take 1 tablet by mouth daily.  30 tablet  5  . [DISCONTINUED] benazepril (LOTENSIN) 20 MG tablet Take 20 mg by mouth daily.       No current facility-administered medications for this visit.    Past Medical History  Diagnosis Date  . Coronary artery disease   . Hypertension   . Head injury   . Dysphagia   . Presence of permanent cardiac pacemaker 02/01/2003    Medtronic  . Pacemaker generator end of life 12/18/2011    Medtronic  . Syncope     Past Surgical History  Procedure Laterality Date  . Total shoulder arthroplasty    .  Pacemaker insertion  2005    Medtronic  . Loop recorder implant/explant  2005  . Pacemaker generator change  12/18/2011    Medtronic  . Nm myocar perf wall motion  01/2011    Negative    No family history on file.  History   Social History  . Marital Status: Single    Spouse Name: N/A    Number of Children: N/A  . Years of Education: N/A   Occupational History  . Disabled    Social History Main Topics  . Smoking status: Former Smoker -- 1.00 packs/day for 15 years    Types: Cigarettes    Quit date: 03/15/1996  . Smokeless tobacco: Never Used  . Alcohol Use: No  . Drug Use: No  . Sexual Activity: Not on file   Other Topics Concern  . Not on file   Social History Narrative  . No narrative on file    Review of systems: The patient  specifically denies any chest pain at rest or with exertion, dyspnea at rest or with exertion, orthopnea, paroxysmal nocturnal dyspnea, syncope, palpitations, focal neurological deficits, intermittent claudication, lower extremity edema, unexplained weight gain, cough, hemoptysis or wheezing.  The patient also denies abdominal pain, nausea, vomiting, dysphagia, diarrhea, constipation, polyuria, polydipsia, dysuria, hematuria, frequency, urgency, abnormal bleeding or bruising, fever, chills, unexpected weight changes, mood swings, change in skin or hair texture, change in voice quality, auditory or visual problems, allergic reactions or rashes, new musculoskeletal complaints other than usual "aches and pains".   PHYSICAL EXAM BP 156/94  Pulse 72  Ht 6' (1.829 m)  Wt 217 lb 12.8 oz (98.793 kg)  BMI 29.53 kg/m2  General: Alert, oriented x3, no distress Head: no evidence of trauma, PERRL, EOMI, no exophtalmos or lid lag, no myxedema, no xanthelasma; normal ears, nose and oropharynx Neck: normal jugular venous pulsations and no hepatojugular reflux; brisk carotid pulses without delay and no carotid bruits Chest: clear to auscultation, no signs of consolidation by percussion or palpation, normal fremitus, symmetrical and full respiratory excursions; healthy appearance of the pacemaker site Cardiovascular: normal position and quality of the apical impulse, regular rhythm, normal first and second heart sounds, no murmurs, rubs or gallops Abdomen: no tenderness or distention, no masses by palpation, no abnormal pulsatility or arterial bruits, normal bowel sounds, no hepatosplenomegaly Extremities: no clubbing, cyanosis or edema; 2+ radial, ulnar and brachial pulses bilaterally; 2+ right femoral, posterior tibial and dorsalis pedis pulses; 2+ left femoral, posterior tibial and dorsalis pedis pulses; no subclavian or femoral bruits Neurological: grossly nonfocal   EKG: Atrial paced ventricular sensed  rhythm otherwise normal  Labs TSH, T4 normal December 2013. Creatinine 1.5 otherwise normal labs  BMET    Component Value Date/Time   NA 139 12/16/2011 1647   K 4.2 12/16/2011 1647   CL 105 12/16/2011 1647   CO2 26 12/16/2011 1647   GLUCOSE 103* 12/16/2011 1647   BUN 15 12/16/2011 1647   CREATININE 1.20 12/16/2011 1647   CALCIUM 9.1 12/16/2011 1647   GFRNONAA 63* 12/16/2011 1647   GFRAA 73* 12/16/2011 1647     ASSESSMENT AND PLAN Pacemaker - dual chamber Medtronic 2004, new generator 2013 Comprehensive device check shows normal function. Atrial and ventricular lead parameters are excellent. There is 96% atrial pacing and virtually no ventricular pacing. 3 extremely brief episodes of mode switch her recorded 2 brief episodes of nonsustained ventricular tachycardia each 6 seconds in duration. Expected generator longevity greater than 11 years. The heart rate distribution  histogram is strongly suggestive of chronotropic incompetence with insufficient sensor intervention. Decreased activity threshold to "low". Reassess after 3-6 months.  Coronary artery disease nonobstructive, Jan 2005, Myoview low risk Jan 2013 He does not have any symptoms to suggest active coronary insufficiency. In fact he is physically very active without restriction from chest discomfort. Suspect noncardiac cause of occasional chest discomfort.  HTN (hypertension) His blood pressure was very high today but he reports that this is a fairly typical value. I replaced his Azor or with tribenzor 10/40/25 mg daily   Patient Instructions  Remote monitoring is used to monitor your pacemaker from home. This monitoring reduces the number of office visits required to check your device to one time per year. It allows [place] to keep an eye on the functioning of your device to ensure it is working properly. You are scheduled for a device check from home on 03-06-2013. You may send your transmission at any time that day. If you have a  wireless device, the transmission will be sent automatically. After your physician reviews your transmission, you will receive a postcard with your next transmission date.  STOP Amlodipine/olmesartan.  START Tribenzor 40/10/25mg .  Your physician recommends that you schedule a follow-up appointment in: 6 months.        Orders Placed This Encounter  Procedures  . EKG 12-Lead   Meds ordered this encounter  Medications  . diazepam (VALIUM) 5 MG tablet    Sig: Take 5 mg by mouth as needed.  . Vortioxetine HBr (BRINTELLIX) 10 MG TABS    Sig: Take 10 mg by mouth daily.  . fish oil-omega-3 fatty acids 1000 MG capsule    Sig: Take 2 g by mouth daily.  . pantoprazole (PROTONIX) 40 MG tablet    Sig: Take 1 tablet (40 mg total) by mouth daily.    Dispense:  30 tablet    Refill:  5    Order Specific Question:  Supervising Provider    Answer:  Nicki Guadalajara A [[id number]]  . Olmesartan-Amlodipine-HCTZ 40-10-25 MG TABS    Sig: Take 1 tablet by mouth daily.    Dispense:  30 tablet    Refill:  5    Aradia Estey  Thurmon Fair, MD, [facility] [phone] office [phone] pager

## 2012-12-16 LAB — MDC_IDC_ENUM_SESS_TYPE_INCLINIC
Battery Impedance: 111 Ohm
Battery Remaining Longevity: 12.5
Battery Voltage: 2.79 V
Brady Statistic AP VP Percent: 0.1 %
Brady Statistic AP VS Percent: 95.8 %
Brady Statistic AS VP Percent: 0.1 %
Brady Statistic AS VS Percent: 4.1 %
Lead Channel Impedance Value: 413 Ohm
Lead Channel Impedance Value: 642 Ohm
Lead Channel Pacing Threshold Amplitude: 0.5 V
Lead Channel Pacing Threshold Amplitude: 0.75 V
Lead Channel Pacing Threshold Pulse Width: 0.4 ms
Lead Channel Pacing Threshold Pulse Width: 0.4 ms
Lead Channel Sensing Intrinsic Amplitude: 4 mV
Lead Channel Sensing Intrinsic Amplitude: 8 mV
Lead Channel Setting Pacing Amplitude: 1.5 V
Lead Channel Setting Pacing Amplitude: 2 V
Lead Channel Setting Pacing Pulse Width: 0.4 ms
Lead Channel Setting Sensing Sensitivity: 4 mV

## 2013-03-06 ENCOUNTER — Ambulatory Visit (INDEPENDENT_AMBULATORY_CARE_PROVIDER_SITE_OTHER): Payer: Medicare HMO | Admitting: *Deleted

## 2013-03-06 DIAGNOSIS — I495 Sick sinus syndrome: Secondary | ICD-10-CM

## 2013-03-06 LAB — MDC_IDC_ENUM_SESS_TYPE_REMOTE
Brady Statistic AP VS Percent: 98.4 %
Brady Statistic AS VP Percent: 0.1 % — CL
Brady Statistic AS VS Percent: 1.5 %
Lead Channel Pacing Threshold Amplitude: 0.5 V
Lead Channel Pacing Threshold Amplitude: 0.875 V
Lead Channel Setting Pacing Amplitude: 2 V
Lead Channel Setting Pacing Pulse Width: 0.4 ms
MDC IDC MSMT LEADCHNL RA IMPEDANCE VALUE: 414 Ohm
MDC IDC MSMT LEADCHNL RA PACING THRESHOLD PULSEWIDTH: 0.4 ms
MDC IDC MSMT LEADCHNL RV IMPEDANCE VALUE: 699 Ohm
MDC IDC MSMT LEADCHNL RV PACING THRESHOLD PULSEWIDTH: 0.4 ms
MDC IDC MSMT LEADCHNL RV SENSING INTR AMPL: 8 mV
MDC IDC SET LEADCHNL RA PACING AMPLITUDE: 1.5 V
MDC IDC SET LEADCHNL RV SENSING SENSITIVITY: 4 mV
MDC IDC STAT BRADY AP VP PERCENT: 0.1 % — AB

## 2013-04-04 ENCOUNTER — Encounter: Payer: Self-pay | Admitting: *Deleted

## 2013-04-11 ENCOUNTER — Encounter: Payer: Self-pay | Admitting: Cardiovascular Disease

## 2013-05-02 ENCOUNTER — Telehealth: Payer: Self-pay | Admitting: Cardiovascular Disease

## 2013-05-02 MED ORDER — PANTOPRAZOLE SODIUM 40 MG PO TBEC: 40.0000 mg | DELAYED_RELEASE_TABLET | Freq: Every day | ORAL | Status: DC

## 2013-05-02 NOTE — Telephone Encounter (Signed)
Refill(s) sent to pharmacy.   Pt due for appt in May.

## 2013-05-02 NOTE — Telephone Encounter (Signed)
Need refill on Pantoprazole 40 mg #30

## 2013-06-08 ENCOUNTER — Telehealth: Payer: Self-pay | Admitting: Cardiovascular Disease

## 2013-06-09 NOTE — Telephone Encounter (Signed)
Closed encounter °

## 2013-06-22 ENCOUNTER — Encounter: Payer: Self-pay | Admitting: Cardiovascular Disease

## 2013-08-15 ENCOUNTER — Encounter: Payer: Self-pay | Admitting: Cardiovascular Disease

## 2013-08-15 ENCOUNTER — Ambulatory Visit (INDEPENDENT_AMBULATORY_CARE_PROVIDER_SITE_OTHER): Payer: Medicare HMO | Admitting: Cardiovascular Disease

## 2013-08-15 VITALS — BP 128/86 | HR 76 | Resp 16 | Ht 72.0 in | Wt 216.2 lb

## 2013-08-15 DIAGNOSIS — I495 Sick sinus syndrome: Secondary | ICD-10-CM

## 2013-08-15 DIAGNOSIS — I10 Essential (primary) hypertension: Secondary | ICD-10-CM

## 2013-08-15 DIAGNOSIS — Z950 Presence of cardiac pacemaker: Secondary | ICD-10-CM

## 2013-08-15 DIAGNOSIS — I2510 Atherosclerotic heart disease of native coronary artery without angina pectoris: Secondary | ICD-10-CM

## 2013-08-15 DIAGNOSIS — R079 Chest pain, unspecified: Secondary | ICD-10-CM

## 2013-08-15 MED ORDER — PANTOPRAZOLE SODIUM 40 MG PO TBEC: 40.0000 mg | DELAYED_RELEASE_TABLET | Freq: Every day | ORAL | Status: DC

## 2013-08-15 NOTE — Patient Instructions (Signed)
Your physician recommends that you schedule a follow-up appointment in 3 months with Dr Sallyanne Kuster with pacemaker check.

## 2013-08-16 ENCOUNTER — Encounter: Payer: Self-pay | Admitting: Cardiovascular Disease

## 2013-08-16 LAB — MDC_IDC_ENUM_SESS_TYPE_INCLINIC
Battery Remaining Longevity: 144 mo
Battery Voltage: 2.79 V
Lead Channel Impedance Value: 420 Ohm
Lead Channel Pacing Threshold Amplitude: 0.5 V
Lead Channel Pacing Threshold Amplitude: 0.75 V
Lead Channel Pacing Threshold Pulse Width: 0.4 ms
Lead Channel Setting Pacing Amplitude: 2 V
Lead Channel Setting Pacing Pulse Width: 0.4 ms
Lead Channel Setting Sensing Sensitivity: 4 mV
MDC IDC MSMT BATTERY IMPEDANCE: 135 Ohm
MDC IDC MSMT LEADCHNL RA PACING THRESHOLD PULSEWIDTH: 0.4 ms
MDC IDC MSMT LEADCHNL RA SENSING INTR AMPL: 4 mV
MDC IDC MSMT LEADCHNL RV IMPEDANCE VALUE: 678 Ohm
MDC IDC MSMT LEADCHNL RV SENSING INTR AMPL: 8 mV
MDC IDC SESS DTM: 20150721133045
MDC IDC SET LEADCHNL RA PACING AMPLITUDE: 1.5 V
MDC IDC STAT BRADY AP VP PERCENT: 0 %
MDC IDC STAT BRADY AP VS PERCENT: 98 %
MDC IDC STAT BRADY AS VP PERCENT: 0 %
MDC IDC STAT BRADY AS VS PERCENT: 2 %

## 2013-08-16 NOTE — Progress Notes (Signed)
Patient ID: Aaron Snyder, male   DOB: 1950/09/22, 63 y.o.   MRN: [id number]      Reason for office visit Pacemaker check, HTN  Aaron Snyder continues to exercise virtually daily, for 30-45 minutes at a time. His ppreferred exercise is the stationary bike. His pacemaker continues to have a very blunted heart rate profile, despite the rate response adjustments we made at the last check.  He initially received a pacemaker in 2005 after several episodes of syncope and documented sinus pauses on a loop recorder. At generator change out was performed for ER I last year. He has treated systemic hypertension and a history of head injury and cervical spine problems. Chest pain in the past has led to coronary angiography which showed minor plaque without any meaningful stenoses. Note that at that time (2005) there was no evidence of renal artery stenosis but his blood pressure was described as severely elevated.   Allergies  Allergen Reactions  . Apple Anaphylaxis  . Carrot [Daucus Carota] Anaphylaxis  . Iodinated Diagnostic Agents Shortness Of Breath and Swelling    Can have contrast as long as he has a 13 prep  . Oat Anaphylaxis  . Soy Allergy Anaphylaxis  . Wheat Anaphylaxis  . Almond Meal Itching    Current Outpatient Prescriptions  Medication Sig Dispense Refill  . acetaminophen (TYLENOL) 325 MG tablet Take 1-2 tablets (325-650 mg total) by mouth every 4 (four) hours as needed.  2 tablet    . amLODipine (NORVASC) 10 MG tablet Take 10 mg by mouth daily.      [name] atenolol (TENORMIN) 25 MG tablet Take 50 mg by mouth 2 (two) times daily.      [name] BENICAR 40 MG tablet Take 40 mg by mouth daily.      . chlorthalidone (HYGROTON) 25 MG tablet Take 25 mg by mouth daily.      . diazepam (VALIUM) 5 MG tablet Take 5 mg by mouth as needed.      . fish oil-omega-3 fatty acids 1000 MG capsule Take 2 g by mouth daily.      [name] levocetirizine (XYZAL) 5 MG tablet Take 5 mg by mouth daily.      . pantoprazole  (PROTONIX) 40 MG tablet Take 1 tablet (40 mg total) by mouth daily. Please call for an appointment.  90 tablet  3  . PRESCRIPTION MEDICATION Inject 1 application as directed every other day. Allergy shot  provided by allergist      . Vortioxetine HBr (BRINTELLIX) 10 MG TABS Take 10 mg by mouth daily.      . [DISCONTINUED] benazepril (LOTENSIN) 20 MG tablet Take 20 mg by mouth daily.       No current facility-administered medications for this visit.    Past Medical History  Diagnosis Date  . Coronary artery disease   . Hypertension   . Head injury   . Dysphagia   . Presence of permanent cardiac pacemaker 02/01/2003    Medtronic  . Pacemaker generator end of life 12/18/2011    Medtronic  . Syncope     Past Surgical History  Procedure Laterality Date  . Total shoulder arthroplasty    . Pacemaker insertion  2005    Medtronic  . Loop recorder implant/explant  2005  . Pacemaker generator change  12/18/2011    Medtronic  . Nm myocar perf wall motion  01/2011    Negative    No family history on file.  History   Social History  .  Marital Status: Single    Spouse Name: N/A    Number of Children: N/A  . Years of Education: N/A   Occupational History  . Disabled    Social History Main Topics  . Smoking status: Former Smoker -- 1.00 packs/day for 15 years    Types: Cigarettes    Quit date: 03/15/1996  . Smokeless tobacco: Never Used  . Alcohol Use: No  . Drug Use: No  . Sexual Activity: Not on file   Other Topics Concern  . Not on file   Social History Narrative  . No narrative on file    Review of systems: The patient specifically denies any chest pain at rest or with exertion, dyspnea at rest or with exertion, orthopnea, paroxysmal nocturnal dyspnea, syncope, palpitations, focal neurological deficits, intermittent claudication, lower extremity edema, unexplained weight gain, cough, hemoptysis or wheezing.  The patient also denies abdominal pain, nausea, vomiting,  dysphagia, diarrhea, constipation, polyuria, polydipsia, dysuria, hematuria, frequency, urgency, abnormal bleeding or bruising, fever, chills, unexpected weight changes, mood swings, change in skin or hair texture, change in voice quality, auditory or visual problems, allergic reactions or rashes, new musculoskeletal complaints other than usual "aches and pains".   PHYSICAL EXAM BP 128/86  Pulse 76  Resp 16  Ht 6' (1.829 m)  Wt 216 lb 3.2 oz (98.068 kg)  BMI 29.32 kg/m2 General: Alert, oriented x3, no distress  Head: no evidence of trauma, PERRL, EOMI, no exophtalmos or lid lag, no myxedema, no xanthelasma; normal ears, nose and oropharynx  Neck: normal jugular venous pulsations and no hepatojugular reflux; brisk carotid pulses without delay and no carotid bruits  Chest: clear to auscultation, no signs of consolidation by percussion or palpation, normal fremitus, symmetrical and full respiratory excursions; healthy appearance of the pacemaker site  Cardiovascular: normal position and quality of the apical impulse, regular rhythm, normal first and second heart sounds, no murmurs, rubs or gallops  Abdomen: no tenderness or distention, no masses by palpation, no abnormal pulsatility or arterial bruits, normal bowel sounds, no hepatosplenomegaly  Extremities: no clubbing, cyanosis or edema; 2+ radial, ulnar and brachial pulses bilaterally; 2+ right femoral, posterior tibial and dorsalis pedis pulses; 2+ left femoral, posterior tibial and dorsalis pedis pulses; no subclavian or femoral bruits  Neurological: grossly nonfocal   EKG: Atrial paced ventricular sensed rhythm otherwise normal    BMET    Component Value Date/Time   NA 139 12/16/2011 1647   K 4.2 12/16/2011 1647   CL 105 12/16/2011 1647   CO2 26 12/16/2011 1647   GLUCOSE 103* 12/16/2011 1647   BUN 15 12/16/2011 1647   CREATININE 1.20 12/16/2011 1647   CALCIUM 9.1 12/16/2011 1647   GFRNONAA 63* 12/16/2011 1647   GFRAA 73*  12/16/2011 1647     ASSESSMENT AND PLAN  Pacemaker - dual chamber Medtronic 2004, new generator 2013  Comprehensive device check shows normal function. Atrial and ventricular lead parameters are excellent. There is 98% atrial pacing and virtually no ventricular pacing. Infrequent, previously detected nonsustained ventricular tachycardia remain rare, brief and asymptomatic.[name] Expected generator longevity greater than 11 years.  The heart rate distribution histogram is stillsuggestive of chronotropic incompetence with insufficient sensor intervention. Increased sensor response. Reassess after 3-6 months.  Coronary artery disease nonobstructive, Jan 2005, Myoview low risk Jan 2013  He does not have any symptoms to suggest active coronary insufficiency. In fact he is physically very active without restriction from chest discomfort. Suspect noncardiac cause of occasional chest discomfort.  HTN (hypertension)  His blood pressure was better controlled today. Avoid beta blockers if possible.   Patient Instructions   Remote monitoring is used to monitor your pacemaker from home. This monitoring reduces the number of office visits required to check your device to one time per year. It allows [place] to keep an eye on the functioning of your device to ensure it is working properly. You are scheduled for a device check from home on 03-06-2013. You may send your transmission at any time that day. If you have a wireless device, the transmission will be sent automatically. After your physician reviews your transmission, you will receive a postcard with your next transmission date.   Orders Placed This Encounter  Procedures  . Implantable device check  . EKG 12-Lead   Meds ordered this encounter  Medications  . amLODipine (NORVASC) 10 MG tablet    Sig: Take 10 mg by mouth daily.  . chlorthalidone (HYGROTON) 25 MG tablet    Sig: Take 25 mg by mouth daily.  [name] levocetirizine (XYZAL) 5 MG tablet    Sig: Take 5 mg  by mouth daily.  [name] BENICAR 40 MG tablet    Sig: Take 40 mg by mouth daily.  . pantoprazole (PROTONIX) 40 MG tablet    Sig: Take 1 tablet (40 mg total) by mouth daily. Please call for an appointment.    Dispense:  90 tablet    Refill:  Ivanhoe Antone Summons, MD, [facility] [phone] office [phone] pager

## 2013-08-22 ENCOUNTER — Encounter: Payer: Self-pay | Admitting: Internal Medicine

## 2013-10-14 ENCOUNTER — Encounter (HOSPITAL_COMMUNITY): Payer: Self-pay | Admitting: Emergency Medicine

## 2013-10-14 ENCOUNTER — Emergency Department (HOSPITAL_COMMUNITY): Payer: Medicare HMO

## 2013-10-14 ENCOUNTER — Inpatient Hospital Stay (HOSPITAL_COMMUNITY)
Admission: EM | Admit: 2013-10-14 | Discharge: 2013-10-17 | DRG: 194 | Disposition: A | Discharge status: Home / Self Care | Payer: Medicare HMO | Attending: Family Medicine | Admitting: Family Medicine

## 2013-10-14 ENCOUNTER — Emergency Department (INDEPENDENT_AMBULATORY_CARE_PROVIDER_SITE_OTHER)
Admission: EM | Admit: 2013-10-14 | Discharge: 2013-10-14 | Disposition: A | Discharge status: Other Acute Inpatient Hospital | Payer: Medicare HMO | Source: Home / Self Care

## 2013-10-14 DIAGNOSIS — Z79899 Other long term (current) drug therapy: Secondary | ICD-10-CM

## 2013-10-14 DIAGNOSIS — J189 Pneumonia, unspecified organism: Secondary | ICD-10-CM | POA: Diagnosis not present

## 2013-10-14 DIAGNOSIS — Z950 Presence of cardiac pacemaker: Secondary | ICD-10-CM

## 2013-10-14 DIAGNOSIS — R1012 Left upper quadrant pain: Secondary | ICD-10-CM | POA: Diagnosis present

## 2013-10-14 DIAGNOSIS — R072 Precordial pain: Secondary | ICD-10-CM

## 2013-10-14 DIAGNOSIS — I2510 Atherosclerotic heart disease of native coronary artery without angina pectoris: Secondary | ICD-10-CM | POA: Diagnosis present

## 2013-10-14 DIAGNOSIS — R933 Abnormal findings on diagnostic imaging of other parts of digestive tract: Secondary | ICD-10-CM | POA: Diagnosis present

## 2013-10-14 DIAGNOSIS — K5900 Constipation, unspecified: Secondary | ICD-10-CM | POA: Diagnosis present

## 2013-10-14 DIAGNOSIS — K2980 Duodenitis without bleeding: Secondary | ICD-10-CM | POA: Diagnosis present

## 2013-10-14 DIAGNOSIS — J90 Pleural effusion, not elsewhere classified: Secondary | ICD-10-CM | POA: Diagnosis present

## 2013-10-14 DIAGNOSIS — R109 Unspecified abdominal pain: Secondary | ICD-10-CM

## 2013-10-14 DIAGNOSIS — R0781 Pleurodynia: Secondary | ICD-10-CM | POA: Diagnosis present

## 2013-10-14 DIAGNOSIS — I10 Essential (primary) hypertension: Secondary | ICD-10-CM

## 2013-10-14 DIAGNOSIS — N179 Acute kidney failure, unspecified: Secondary | ICD-10-CM | POA: Diagnosis present

## 2013-10-14 DIAGNOSIS — Z23 Encounter for immunization: Secondary | ICD-10-CM

## 2013-10-14 DIAGNOSIS — I498 Other specified cardiac arrhythmias: Secondary | ICD-10-CM | POA: Diagnosis present

## 2013-10-14 DIAGNOSIS — J181 Lobar pneumonia, unspecified organism: Secondary | ICD-10-CM | POA: Diagnosis present

## 2013-10-14 DIAGNOSIS — J13 Pneumonia due to Streptococcus pneumoniae: Secondary | ICD-10-CM

## 2013-10-14 DIAGNOSIS — Z87891 Personal history of nicotine dependence: Secondary | ICD-10-CM

## 2013-10-14 DIAGNOSIS — I129 Hypertensive chronic kidney disease with stage 1 through stage 4 chronic kidney disease, or unspecified chronic kidney disease: Secondary | ICD-10-CM | POA: Diagnosis present

## 2013-10-14 DIAGNOSIS — N182 Chronic kidney disease, stage 2 (mild): Secondary | ICD-10-CM | POA: Diagnosis present

## 2013-10-14 DIAGNOSIS — R1310 Dysphagia, unspecified: Secondary | ICD-10-CM | POA: Diagnosis present

## 2013-10-14 DIAGNOSIS — Z96619 Presence of unspecified artificial shoulder joint: Secondary | ICD-10-CM

## 2013-10-14 LAB — URINALYSIS, ROUTINE W REFLEX MICROSCOPIC
Bilirubin Urine: NEGATIVE
Glucose, UA: NEGATIVE mg/dL
HGB URINE DIPSTICK: NEGATIVE
KETONES UR: NEGATIVE mg/dL
Leukocytes, UA: NEGATIVE
Nitrite: NEGATIVE
PH: 6 (ref 5.0–8.0)
PROTEIN: NEGATIVE mg/dL
Specific Gravity, Urine: 1.019 (ref 1.005–1.030)
Urobilinogen, UA: 0.2 mg/dL (ref 0.0–1.0)

## 2013-10-14 LAB — COMPREHENSIVE METABOLIC PANEL
ALT: 16 U/L (ref 0–53)
AST: 21 U/L (ref 0–37)
Albumin: 3.3 g/dL — ABNORMAL LOW (ref 3.5–5.2)
Alkaline Phosphatase: 80 U/L (ref 39–117)
Anion gap: 12 (ref 5–15)
BILIRUBIN TOTAL: 0.5 mg/dL (ref 0.3–1.2)
BUN: 20 mg/dL (ref 6–23)
CHLORIDE: 96 meq/L (ref 96–112)
CO2: 28 meq/L (ref 19–32)
Calcium: 9 mg/dL (ref 8.4–10.5)
Creatinine, Ser: 1.39 mg/dL — ABNORMAL HIGH (ref 0.50–1.35)
GFR calc Af Amer: 60 mL/min — ABNORMAL LOW (ref >90)
GFR, EST NON AFRICAN AMERICAN: 52 mL/min — AB (ref >90)
Glucose, Bld: 113 mg/dL — ABNORMAL HIGH (ref 70–99)
Potassium: 3.6 mEq/L — ABNORMAL LOW (ref 3.7–5.3)
SODIUM: 136 meq/L — AB (ref 137–147)
Total Protein: 6.7 g/dL (ref 6.0–8.3)

## 2013-10-14 LAB — TROPONIN I

## 2013-10-14 LAB — LIPASE, BLOOD: Lipase: 24 U/L (ref 11–59)

## 2013-10-14 LAB — CBC WITH DIFFERENTIAL/PLATELET
BASOS ABS: 0 10*3/uL (ref 0.0–0.1)
Basophils Relative: 0 % (ref 0–1)
Eosinophils Absolute: 0.1 10*3/uL (ref 0.0–0.7)
Eosinophils Relative: 1 % (ref 0–5)
HCT: 38.1 % — ABNORMAL LOW (ref 39.0–52.0)
Hemoglobin: 13.2 g/dL (ref 13.0–17.0)
Lymphocytes Relative: 14 % (ref 12–46)
Lymphs Abs: 1.2 10*3/uL (ref 0.7–4.0)
MCH: 30 pg (ref 26.0–34.0)
MCHC: 34.6 g/dL (ref 30.0–36.0)
MCV: 86.6 fL (ref 78.0–100.0)
Monocytes Absolute: 1.1 10*3/uL — ABNORMAL HIGH (ref 0.1–1.0)
Monocytes Relative: 13 % — ABNORMAL HIGH (ref 3–12)
NEUTROS ABS: 6.1 10*3/uL (ref 1.7–7.7)
Neutrophils Relative %: 72 % (ref 43–77)
PLATELETS: 178 10*3/uL (ref 150–400)
RBC: 4.4 MIL/uL (ref 4.22–5.81)
RDW: 13.7 % (ref 11.5–15.5)
WBC: 8.5 10*3/uL (ref 4.0–10.5)

## 2013-10-14 LAB — LACTIC ACID, PLASMA: Lactic Acid, Venous: 1.1 mmol/L (ref 0.5–2.2)

## 2013-10-14 MED ORDER — PANTOPRAZOLE SODIUM 40 MG IV SOLR
40.0000 mg | Freq: Once | INTRAVENOUS | Status: AC
Start: 2013-10-14 — End: 2013-10-14
  Administered 2013-10-14: 40 mg via INTRAVENOUS
  Filled 2013-10-14: qty 40

## 2013-10-14 MED ORDER — ONDANSETRON HCL 4 MG PO TABS: 4.0000 mg | ORAL_TABLET | Freq: Four times a day (QID) | ORAL | Status: DC | PRN

## 2013-10-14 MED ORDER — DIAZEPAM 5 MG PO TABS: 5.0000 mg | ORAL_TABLET | Freq: Every day | ORAL | Status: DC | PRN

## 2013-10-14 MED ORDER — INFLUENZA VAC SPLIT QUAD 0.5 ML IM SUSY
0.5000 mL | PREFILLED_SYRINGE | INTRAMUSCULAR | Status: AC
  Administered 2013-10-16: 0.5 mL via INTRAMUSCULAR
  Filled 2013-10-14: qty 0.5

## 2013-10-14 MED ORDER — HYDROMORPHONE HCL 1 MG/ML IJ SOLN
1.0000 mg | Freq: Once | INTRAMUSCULAR | Status: AC
  Administered 2013-10-14: 1 mg via INTRAVENOUS
  Filled 2013-10-14: qty 1

## 2013-10-14 MED ORDER — MORPHINE SULFATE 2 MG/ML IJ SOLN
1.0000 mg | INTRAMUSCULAR | Status: DC | PRN
  Administered 2013-10-15 (×2): 1 mg via INTRAVENOUS
  Filled 2013-10-14 (×2): qty 1

## 2013-10-14 MED ORDER — AMLODIPINE BESYLATE 10 MG PO TABS
10.0000 mg | ORAL_TABLET | Freq: Every day | ORAL | Status: DC
  Administered 2013-10-14 – 2013-10-17 (×4): 10 mg via ORAL
  Filled 2013-10-14 (×4): qty 1

## 2013-10-14 MED ORDER — GI COCKTAIL ~~LOC~~
30.0000 mL | Freq: Once | ORAL | Status: AC
  Administered 2013-10-14: 30 mL via ORAL
  Filled 2013-10-14: qty 30

## 2013-10-14 MED ORDER — IRBESARTAN 300 MG PO TABS
300.0000 mg | ORAL_TABLET | Freq: Every day | ORAL | Status: DC
  Administered 2013-10-15: 300 mg via ORAL
  Filled 2013-10-14: qty 1

## 2013-10-14 MED ORDER — BARIUM SULFATE 2.1 % PO SUSP
900.0000 mL | ORAL | Status: DC
  Administered 2013-10-14: 900 mL via ORAL

## 2013-10-14 MED ORDER — ATENOLOL 25 MG PO TABS
25.0000 mg | ORAL_TABLET | Freq: Two times a day (BID) | ORAL | Status: DC
  Administered 2013-10-14 – 2013-10-17 (×6): 25 mg via ORAL
  Filled 2013-10-14 (×7): qty 1

## 2013-10-14 MED ORDER — ONDANSETRON HCL 4 MG/2ML IJ SOLN: 4.0000 mg | Freq: Four times a day (QID) | INTRAMUSCULAR | Status: DC | PRN

## 2013-10-14 MED ORDER — SODIUM CHLORIDE 0.9 % IV SOLN
Freq: Once | INTRAVENOUS | Status: AC
  Administered 2013-10-14: 15:00:00 via INTRAVENOUS

## 2013-10-14 MED ORDER — ACETAMINOPHEN 650 MG RE SUPP: 650.0000 mg | Freq: Four times a day (QID) | RECTAL | Status: DC | PRN

## 2013-10-14 MED ORDER — ACETAMINOPHEN 325 MG PO TABS: 650.0000 mg | ORAL_TABLET | Freq: Four times a day (QID) | ORAL | Status: DC | PRN

## 2013-10-14 MED ORDER — LIDOCAINE 5 % EX PTCH
1.0000 | MEDICATED_PATCH | Freq: Every day | CUTANEOUS | Status: DC | PRN
  Filled 2013-10-14: qty 1

## 2013-10-14 MED ORDER — LEVOFLOXACIN IN D5W 750 MG/150ML IV SOLN
750.0000 mg | INTRAVENOUS | Status: DC
  Administered 2013-10-14 – 2013-10-15 (×2): 750 mg via INTRAVENOUS
  Filled 2013-10-14 (×3): qty 150

## 2013-10-14 MED ORDER — KCL IN DEXTROSE-NACL 20-5-0.9 MEQ/L-%-% IV SOLN
INTRAVENOUS | Status: AC
  Administered 2013-10-14 – 2013-10-15 (×2): via INTRAVENOUS
  Filled 2013-10-14 (×3): qty 1000

## 2013-10-14 MED ORDER — PANTOPRAZOLE SODIUM 40 MG IV SOLR
40.0000 mg | Freq: Two times a day (BID) | INTRAVENOUS | Status: DC
  Administered 2013-10-14 – 2013-10-15 (×2): 40 mg via INTRAVENOUS
  Filled 2013-10-14 (×3): qty 40

## 2013-10-14 MED ORDER — ONDANSETRON HCL 4 MG/2ML IJ SOLN
4.0000 mg | Freq: Once | INTRAMUSCULAR | Status: AC
  Administered 2013-10-14: 4 mg via INTRAVENOUS
  Filled 2013-10-14: qty 2

## 2013-10-14 NOTE — ED Notes (Signed)
Pt from [facility] via [facility] with c/o LUQ pain x 2 days radiating into his left armpit.  Pt reports a hx of abdominal adhesions; this feels similar but worse.  Pt in NAD, A&O.

## 2013-10-14 NOTE — H&P (Signed)
[facility] History and Physical  Aaron Snyder WUJ:[id number] DOB: December 11, 1950 DOA: 10/14/2013  Referring physician: ER physician. PCP: No PCP Per Patient [facility].  Chief Complaint: Left upper quadrant pain.  HPI: Aaron Snyder is a 63 y.o. male history of pacemaker placement, hypertension presents to the ER because of increasing pain in the left upper quadrant. Patient states he has been having pain off and on for last year and half in the left upper quadrant. Over the last 2 days it has worsened and has pleuritic complement. Denies any nausea vomiting shortness of breath productive cough fever chills. The pain sometimes radiates to his shoulder and neck. Sometimes increases on exertion. Denies any diarrhea, patient states he has some constipation. Denies any loss of weight. In the ER cardiac markers were negative. CT abdomen pelvis done shows possible duodenitis with left-sided consolidation. On-call gastroenterologist Dr. Carlean Purl was consulted and Dr. Carlean Purl is planning to do EGD in a.m.   Review of Systems: As presented in the history of presenting illness, rest negative.  Past Medical History  Diagnosis Date  . Coronary artery disease   . Hypertension   . Head injury   . Dysphagia   . Presence of permanent cardiac pacemaker 02/01/2003    [employer]tr[employer]  . Pacemaker generator end of life 12/18/2011    [employer]  . Syncope    Past Surgical History  Procedure Laterality Date  . Total shoulder arthroplasty    . Pacemaker insertion  2005    [employer]  . Loop recorder implant/explant  2005  . Pacemaker generator change  12/18/2011    [employer]  . Nm myocar perf wall motion  01/2011    Negative   Social History:  reports that he quit smoking about 17 years ago. His smoking use included Cigarettes. He has a 15 pack-year smoking history. He has never used smokeless tobacco. He reports that he does not drink alcohol or use illicit drugs. Where does patient live  home. Can patient participate in ADLs? Yes.  Allergies  Allergen Reactions  . Apple Anaphylaxis  . Carrot [Daucus Carota] Anaphylaxis  . Iodinated Diagnostic Agents Shortness Of Breath and Swelling    Can have contrast as long as he has a 13 prep  . Oat Anaphylaxis  . Soy Allergy Anaphylaxis  . Wheat Anaphylaxis  . Almond Meal Itching  . Cow's Milk [Lac Bovis]     Hives, and upset stomach     Family History: History reviewed. No pertinent family history.    Prior to Admission medications   Medication Sig Start Date End Date Taking? Authorizing Provider  acetaminophen (TYLENOL) 325 MG tablet Take 1-2 tablets (325-650 mg total) by mouth every 4 (four) hours as needed. 12/18/11  Yes Cecilie Kicks, NP  amLODipine (NORVASC) 10 MG tablet Take 10 mg by mouth daily. 07/25/13  Yes Historical Provider, MD  atenolol (TENORMIN) 25 MG tablet Take 25 mg by mouth 2 (two) times daily.  12/18/11  Yes Cecilie Kicks, NP  BENICAR 40 MG tablet Take 40 mg by mouth daily. 07/25/13  Yes Historical Provider, MD  chlorthalidone (HYGROTON) 25 MG tablet Take 25 mg by mouth daily. 07/26/13  Yes Historical Provider, MD  diazepam (VALIUM) 5 MG tablet Take 5 mg by mouth daily as needed for anxiety.  10/17/12  Yes Historical Provider, MD  levocetirizine (XYZAL) 5 MG tablet Take 5 mg by mouth every evening.   Yes Historical Provider, MD  lidocaine (LIDODERM) 5 % Place 1 patch onto  the skin daily as needed (Depends on how bad back pain is.). Remove & Discard patch within 12 hours or as directed by MD   Yes Historical Provider, MD  Multiple Vitamins-Minerals (MULTIVITAMIN WITH MINERALS) tablet Take 1 tablet by mouth daily.   Yes Historical Provider, MD    Physical Exam: Filed Vitals:   10/14/13 1908 10/14/13 1915 10/14/13 2033 10/14/13 2045  BP: 128/70 132/72 130/60 113/66  Pulse: 73 75 81 68  Temp: 97.8 F (36.6 C)     TempSrc: Oral     Resp: 14 20 21 21   SpO2: 95% 93% 100% 95%     General:  Well-developed and  nourished.  Eyes: Anicteric no pallor.  ENT: No discharge from the ears eyes nose mouth.  Neck: No mass felt.  Cardiovascular: S1-S2 heard.  Respiratory: No rhonchi or crepitations.  Abdomen: Soft mild tenderness in the epigastrium and left upper quadrant.  Skin: No rash.  Musculoskeletal: No localized tenderness over the left chest area.  Psychiatric: Appears normal.  Neurologic: Alert awake oriented to time place and person. Moves all extremities.  Labs on Admission:  Basic Metabolic Panel:  Recent Labs Lab 10/14/13 1720  NA 136*  K 3.6*  CL 96  CO2 28  GLUCOSE 113*  BUN 20  CREATININE 1.39*  CALCIUM 9.0   Liver Function Tests:  Recent Labs Lab 10/14/13 1720  AST 21  ALT 16  ALKPHOS 80  BILITOT 0.5  PROT 6.7  ALBUMIN 3.3*    Recent Labs Lab 10/14/13 1720  LIPASE 24   No results found for this basename: AMMONIA,  in the last 168 hours CBC:  Recent Labs Lab 10/14/13 1720  WBC 8.5  NEUTROABS 6.1  HGB 13.2  HCT 38.1*  MCV 86.6  PLT 178   Cardiac Enzymes:  Recent Labs Lab 10/14/13 1720  TROPONINI <0.30    BNP (last 3 results) No results found for this basename: PROBNP,  in the last 8760 hours CBG: No results found for this basename: GLUCAP,  in the last 168 hours  Radiological Exams on Admission: Ct Abdomen Pelvis Wo Contrast  10/14/2013   CLINICAL DATA:  Severe left upper quadrant pain.  EXAM: CT ABDOMEN AND PELVIS WITHOUT CONTRAST  TECHNIQUE: Multidetector CT imaging of the abdomen and pelvis was performed following the standard protocol without IV contrast.  COMPARISON:  CT 12/18/2007.  FINDINGS: Visualization of the lower thorax demonstrates heterogeneous opacities within the left lower lobe with a small left lower lobe pleural effusion. Additionally there are linear and ground-glass opacities within the right lower lobe and lingula. Normal heart size.  Lack of intravenous contrast material limits evaluation of the solid organ  parenchyma.  Non contrast-enhanced liver and gallbladder are unremarkable. Spleen is unremarkable. Mild fatty atrophy of the pancreas. Normal bilateral adrenal glands. Kidneys are symmetric in size. Stable perinephric fat stranding. Unchanged 5 mm nonobstructing stone inferior pole right kidney. No ureterolithiasis. No hydronephrosis.  Normal caliber abdominal aorta. The urinary bladder is unremarkable. Prostate unremarkable.  No evidence for bowel obstruction. No free fluid or free intraperitoneal air. There is suggestion of wall thickening of the second and third portions of the duodenum.  Multilevel degenerative change of the lumbar spine. No aggressive or acute appearing osseous lesions.  IMPRESSION: Mild wall thickening of second and third portion of the duodenum which may be secondary to duodenitis. Fluid-filled stomach.  Consolidative opacities within left lower lobe with small left pleural effusion. This may represent an infectious process in the  appropriate clinical setting.   Electronically Signed   By: Lovey Newcomer M.D.   On: 10/14/2013 19:16   Dg Chest Port 1 View  10/14/2013   CLINICAL DATA:  Left upper quadrant pain.  EXAM: PORTABLE CHEST - 1 VIEW  COMPARISON:  12/14/2011  FINDINGS: Lung volumes are low. Allowing for this, the lungs are clear. Heart, mediastinum hila are unremarkable. Right anterior chest wall sequential pacemaker has its leads superimposed over the right atrium and right ventricle, stable.  No pleural effusion or pneumothorax.  Bony thorax is grossly intact.  IMPRESSION: No acute cardiopulmonary disease.   Electronically Signed   By: Lajean Manes M.D.   On: 10/14/2013 17:02    EKG: Independently reviewed. Paced rhythm.  Assessment/Plan Active Problems:   HTN (hypertension)   Duodenitis   Abdominal pain   Consolidation lung   1. Left upper quadrant pain most likely could be related to peptic ulcer disease - patient has been kept n.p.o. in anticipation of EGD. Patient is  placed on Protonix IV. When necessary pain with medications. Further recommendations per gastroenterologist. 2. Left lower lobe consolidation - since patient has pleuritic type of pain I have placed patient on Levaquin for possible pneumonia. Patient will eventually need repeat chest x-ray or CAT scan to followup for complete resolution. 3. History of cardiac catheter in 2013 which showed nonobstructive CAD - cycle cardiac markers. 4. Hypertension - continue home medications. 5. History of pacemaker placement for syncope.    Code Status: Full code.  Family Communication: None.  Disposition Plan: Admit to inpatient.    Jerron Niblack N. [facility] Pager [phone].  If 7PM-7AM, please contact night-coverage [email or web address] Password [facility] 10/14/2013, 9:33 PM

## 2013-10-14 NOTE — ED Provider Notes (Signed)
CSN: [id number]     Arrival date & time 10/14/13  1516 History   First MD Initiated Contact with Patient 10/14/13 1546     Chief Complaint  Patient presents with  . Abdominal Pain     (Consider location/radiation/quality/duration/timing/severity/associated sxs/prior Treatment) Patient is a 63 y.o. male presenting with abdominal pain.  Abdominal Pain Pain location:  LUQ Pain quality: gnawing   Pain radiates to:  Does not radiate Pain severity:  Severe Onset quality:  Sudden Duration:  36 hours Timing:  Constant Progression:  Waxing and waning (has baseline pain, then waves of very intense pain) Chronicity:  New Context comment:  Has been experiencing mild LUQ pain for some time, then had sudden onset LUQ pain while sleeping yesterday Relieved by:  Nothing Exacerbated by: palpation, deep breaths, twisting, turning. Associated symptoms: chills, constipation, nausea and shortness of breath (Has been short of breath for several weeks since having pacemaker adjusted.)   Associated symptoms: no diarrhea, no fever, no hematuria and no vomiting     Past Medical History  Diagnosis Date  . Coronary artery disease   . Hypertension   . Head injury   . Dysphagia   . Presence of permanent cardiac pacemaker 02/01/2003    [employer]  . Pacemaker generator end of life 12/18/2011    [employer]  . Syncope    Past Surgical History  Procedure Laterality Date  . Total shoulder arthroplasty    . Pacemaker insertion  2005    [employer]  . Loop recorder implant/explant  2005  . Pacemaker generator change  12/18/2011    [employer]  . Nm myocar perf wall motion  01/2011    Negative   History reviewed. No pertinent family history. History  Substance Use Topics  . Smoking status: Former Smoker -- 1.00 packs/day for 15 years    Types: Cigarettes    Quit date: 03/15/1996  . Smokeless tobacco: Never Used  . Alcohol Use: No    Review of Systems  Constitutional: Positive for chills. Negative for  fever.  Respiratory: Positive for shortness of breath (Has been short of breath for several weeks since having pacemaker adjusted.).   Gastrointestinal: Positive for nausea, abdominal pain and constipation. Negative for vomiting and diarrhea.  Genitourinary: Negative for hematuria.  All other systems reviewed and are negative.     Allergies  Apple; Carrot; Iodinated diagnostic agents; Oat; Soy allergy; Wheat; Almond meal; and Cow's milk  Home Medications   Prior to Admission medications   Medication Sig Start Date End Date Taking? Authorizing Provider  acetaminophen (TYLENOL) 325 MG tablet Take 1-2 tablets (325-650 mg total) by mouth every 4 (four) hours as needed. 12/18/11  Yes Cecilie Kicks, NP  amLODipine (NORVASC) 10 MG tablet Take 10 mg by mouth daily. 07/25/13  Yes Historical Provider, MD  atenolol (TENORMIN) 25 MG tablet Take 25 mg by mouth 2 (two) times daily.  12/18/11  Yes Cecilie Kicks, NP  BENICAR 40 MG tablet Take 40 mg by mouth daily. 07/25/13  Yes Historical Provider, MD  chlorthalidone (HYGROTON) 25 MG tablet Take 25 mg by mouth daily. 07/26/13  Yes Historical Provider, MD  diazepam (VALIUM) 5 MG tablet Take 5 mg by mouth daily as needed for anxiety.  10/17/12  Yes Historical Provider, MD  levocetirizine (XYZAL) 5 MG tablet Take 5 mg by mouth every evening.   Yes Historical Provider, MD   BP 126/69  Pulse 70  Temp(Src) 98.3 F (36.8 C) (Oral)  Resp 25  SpO2 99% Physical  Exam  Nursing note and vitals reviewed. Constitutional: He is oriented to person, place, and time. He appears well-developed and well-nourished. No distress (no distress at baseline, but gets waves of severe pain and distress).  HENT:  Head: Normocephalic and atraumatic.  Mouth/Throat: Oropharynx is clear and moist.  Eyes: Conjunctivae are normal. Pupils are equal, round, and reactive to light. No scleral icterus.  Neck: Neck supple.  Cardiovascular: Normal rate, regular rhythm, normal heart sounds and  intact distal pulses.   No murmur heard. Pulmonary/Chest: Effort normal and breath sounds normal. No stridor. No respiratory distress. He has no wheezes. He has no rales.  Abdominal: Soft. He exhibits no distension. There is tenderness in the left upper quadrant. There is guarding (voluntary). There is no rigidity and no rebound.  Musculoskeletal: Normal range of motion. He exhibits no edema.  Neurological: He is alert and oriented to person, place, and time.  Skin: Skin is warm and dry. No rash noted.  Psychiatric: He has a normal mood and affect. His behavior is normal.    ED Course  Procedures (including critical care time) Labs Review Labs Reviewed  CBC WITH DIFFERENTIAL - Abnormal; Notable for the following:    HCT 38.1 (*)    Monocytes Relative 13 (*)    Monocytes Absolute 1.1 (*)    All other components within normal limits  COMPREHENSIVE METABOLIC PANEL - Abnormal; Notable for the following:    Sodium 136 (*)    Potassium 3.6 (*)    Glucose, Bld 113 (*)    Creatinine, Ser 1.39 (*)    Albumin 3.3 (*)    GFR calc non Af Amer 52 (*)    GFR calc Af Amer 60 (*)    All other components within normal limits  URINALYSIS, ROUTINE W REFLEX MICROSCOPIC - Abnormal; Notable for the following:    APPearance CLOUDY (*)    All other components within normal limits  LIPASE, BLOOD  LACTIC ACID, PLASMA  TROPONIN I    Imaging Review Ct Abdomen Pelvis Wo Contrast  10/14/2013   CLINICAL DATA:  Severe left upper quadrant pain.  EXAM: CT ABDOMEN AND PELVIS WITHOUT CONTRAST  TECHNIQUE: Multidetector CT imaging of the abdomen and pelvis was performed following the standard protocol without IV contrast.  COMPARISON:  CT 12/18/2007.  FINDINGS: Visualization of the lower thorax demonstrates heterogeneous opacities within the left lower lobe with a small left lower lobe pleural effusion. Additionally there are linear and ground-glass opacities within the right lower lobe and lingula. Normal heart  size.  Lack of intravenous contrast material limits evaluation of the solid organ parenchyma.  Non contrast-enhanced liver and gallbladder are unremarkable. Spleen is unremarkable. Mild fatty atrophy of the pancreas. Normal bilateral adrenal glands. Kidneys are symmetric in size. Stable perinephric fat stranding. Unchanged 5 mm nonobstructing stone inferior pole right kidney. No ureterolithiasis. No hydronephrosis.  Normal caliber abdominal aorta. The urinary bladder is unremarkable. Prostate unremarkable.  No evidence for bowel obstruction. No free fluid or free intraperitoneal air. There is suggestion of wall thickening of the second and third portions of the duodenum.  Multilevel degenerative change of the lumbar spine. No aggressive or acute appearing osseous lesions.  IMPRESSION: Mild wall thickening of second and third portion of the duodenum which may be secondary to duodenitis. Fluid-filled stomach.  Consolidative opacities within left lower lobe with small left pleural effusion. This may represent an infectious process in the appropriate clinical setting.   Electronically Signed   By: Lovey Newcomer  M.D.   On: 10/14/2013 19:16   Dg Chest Port 1 View  10/14/2013   CLINICAL DATA:  Left upper quadrant pain.  EXAM: PORTABLE CHEST - 1 VIEW  COMPARISON:  12/14/2011  FINDINGS: Lung volumes are low. Allowing for this, the lungs are clear. Heart, mediastinum hila are unremarkable. Right anterior chest wall sequential pacemaker has its leads superimposed over the right atrium and right ventricle, stable.  No pleural effusion or pneumothorax.  Bony thorax is grossly intact.  IMPRESSION: No acute cardiopulmonary disease.   Electronically Signed   By: Lajean Manes M.D.   On: 10/14/2013 17:02  All radiology studies independently viewed by me.      EKG Interpretation   Date/Time:  Saturday October 14 2013 15:18:40 EDT Ventricular Rate:  73 PR Interval:  191 QRS Duration: 107 QT Interval:  385 QTC  Calculation: 424 R Axis:   -9 Text Interpretation:  Atrial-paced complexes Abnormal R-wave progression,  early transition Compared to previous tracing ventricular-paced complexes  NO LONGER PRESENT Confirmed by Stevie Kern  MD, Jenny Reichmann ([id number]) on 10/14/2013  3:25:04 PM      MDM   Final diagnoses:  Duodenitis    63 year old male presenting with severe left upper quadrant abdominal pain. On exam, tender with guarding to the left upper quadrant with waves of severe pain. CT shows duodenitis. Discussed case with Dr. Carlean Purl (GI) who will follow patient in the hospital. Discussed case with Dr. Hal Hope ([facility]) who will admit. CT scan also shows small consolidations and left lower lung the he has not had a cough, fevers, or leukocytosis. Will defer question of antibiotics to admitting team.    Arbie Cookey, MD 10/15/13 [phone]
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
## 2013-10-14 NOTE — ED Notes (Signed)
Pain onset yesterday, worse today . Pain more persistent. Pain woke him 3 am Friday. Diaphoresis

## 2013-10-14 NOTE — ED Notes (Signed)
Inpatient MD at bedside

## 2013-10-14 NOTE — ED Notes (Signed)
Transporting patient to new room assignment. 

## 2013-10-14 NOTE — Progress Notes (Signed)
ANTIBIOTIC CONSULT NOTE - INITIAL  Pharmacy Consult for levaquin Indication: pneumonia  Allergies  Allergen Reactions  . Apple Anaphylaxis  . Carrot [Daucus Carota] Anaphylaxis  . Iodinated Diagnostic Agents Shortness Of Breath and Swelling    Can have contrast as long as he has a 13 prep  . Oat Anaphylaxis  . Soy Allergy Anaphylaxis  . Wheat Anaphylaxis  . Almond Meal Itching  . Cow's Milk [Lac Bovis]     Hives, and upset stomach     Patient Measurements: Height: 5\' 11"  (180.3 cm) Weight: 220 lb 10.9 oz (100.1 kg) IBW/kg (Calculated) : 75.3  Vital Signs: Temp: 98.2 F (36.8 C) (09/19 2205) Temp src: Oral (09/19 2205) BP: 136/86 mmHg (09/19 2205) Pulse Rate: 75 (09/19 2205) Intake/Output from previous day:   Intake/Output from this shift:    Labs:  Recent Labs  10/14/13 1720  WBC 8.5  HGB 13.2  PLT 178  CREATININE 1.39*   Estimated Creatinine Clearance: 64.7 ml/min (by C-G formula based on Cr of 1.39). No results found for this basename: VANCOTROUGH, VANCOPEAK, VANCORANDOM, GENTTROUGH, GENTPEAK, GENTRANDOM, TOBRATROUGH, TOBRAPEAK, TOBRARND, AMIKACINPEAK, AMIKACINTROU, AMIKACIN,  in the last 72 hours   Microbiology: No results found for this or any previous visit (from the past 720 hour(s)).  Medical History: Past Medical History  Diagnosis Date  . Coronary artery disease   . Hypertension   . Head injury   . Dysphagia   . Presence of permanent cardiac pacemaker 02/01/2003    [employer]  . Pacemaker generator end of life 12/18/2011    [employer]  . Syncope     Assessment: 66 YOM presented with left upper quadrant pain, likely related to PUD. CT abdomen pelvis done shows possible duodenitis with left lower lobe consolidation. Pharmacy is consulted to start levaquin empiric for PNA. Afebrile, wbc wnl, scr 1.39, est. crcl ~ 60 ml/min.   Goal of Therapy:  Resolution of infections  Plan:  Levaquin 750 mg IV Q 24 hrs  Maryanna Shape, PharmD, BCPS  Clinical  Pharmacist  Pager: [phone]   10/14/2013,10:08 PM

## 2013-10-15 ENCOUNTER — Encounter (HOSPITAL_COMMUNITY): Payer: Self-pay | Admitting: Internal Medicine

## 2013-10-15 DIAGNOSIS — J90 Pleural effusion, not elsewhere classified: Secondary | ICD-10-CM | POA: Diagnosis present

## 2013-10-15 DIAGNOSIS — J189 Pneumonia, unspecified organism: Secondary | ICD-10-CM | POA: Diagnosis present

## 2013-10-15 DIAGNOSIS — R0781 Pleurodynia: Secondary | ICD-10-CM | POA: Diagnosis present

## 2013-10-15 DIAGNOSIS — Z79899 Other long term (current) drug therapy: Secondary | ICD-10-CM | POA: Diagnosis not present

## 2013-10-15 DIAGNOSIS — K2980 Duodenitis without bleeding: Secondary | ICD-10-CM | POA: Diagnosis present

## 2013-10-15 DIAGNOSIS — I2510 Atherosclerotic heart disease of native coronary artery without angina pectoris: Secondary | ICD-10-CM | POA: Diagnosis present

## 2013-10-15 DIAGNOSIS — R1310 Dysphagia, unspecified: Secondary | ICD-10-CM | POA: Diagnosis present

## 2013-10-15 DIAGNOSIS — Z950 Presence of cardiac pacemaker: Secondary | ICD-10-CM | POA: Diagnosis not present

## 2013-10-15 DIAGNOSIS — N182 Chronic kidney disease, stage 2 (mild): Secondary | ICD-10-CM | POA: Diagnosis present

## 2013-10-15 DIAGNOSIS — R1012 Left upper quadrant pain: Secondary | ICD-10-CM | POA: Diagnosis present

## 2013-10-15 DIAGNOSIS — Z23 Encounter for immunization: Secondary | ICD-10-CM | POA: Diagnosis not present

## 2013-10-15 DIAGNOSIS — R071 Chest pain on breathing: Secondary | ICD-10-CM

## 2013-10-15 DIAGNOSIS — K5900 Constipation, unspecified: Secondary | ICD-10-CM | POA: Diagnosis present

## 2013-10-15 DIAGNOSIS — Z87891 Personal history of nicotine dependence: Secondary | ICD-10-CM | POA: Diagnosis not present

## 2013-10-15 DIAGNOSIS — R933 Abnormal findings on diagnostic imaging of other parts of digestive tract: Secondary | ICD-10-CM

## 2013-10-15 DIAGNOSIS — Z96619 Presence of unspecified artificial shoulder joint: Secondary | ICD-10-CM | POA: Diagnosis not present

## 2013-10-15 DIAGNOSIS — I129 Hypertensive chronic kidney disease with stage 1 through stage 4 chronic kidney disease, or unspecified chronic kidney disease: Secondary | ICD-10-CM | POA: Diagnosis present

## 2013-10-15 DIAGNOSIS — N179 Acute kidney failure, unspecified: Secondary | ICD-10-CM | POA: Diagnosis present

## 2013-10-15 DIAGNOSIS — I498 Other specified cardiac arrhythmias: Secondary | ICD-10-CM | POA: Diagnosis present

## 2013-10-15 DIAGNOSIS — R079 Chest pain, unspecified: Secondary | ICD-10-CM

## 2013-10-15 HISTORY — DX: Pleurodynia: R07.81

## 2013-10-15 LAB — GLUCOSE, CAPILLARY
GLUCOSE-CAPILLARY: 128 mg/dL — AB (ref 70–99)
GLUCOSE-CAPILLARY: 131 mg/dL — AB (ref 70–99)
Glucose-Capillary: 110 mg/dL — ABNORMAL HIGH (ref 70–99)
Glucose-Capillary: 156 mg/dL — ABNORMAL HIGH (ref 70–99)

## 2013-10-15 LAB — CBC
HCT: 36.5 % — ABNORMAL LOW (ref 39.0–52.0)
Hemoglobin: 12.5 g/dL — ABNORMAL LOW (ref 13.0–17.0)
MCH: 30.2 pg (ref 26.0–34.0)
MCHC: 34.2 g/dL (ref 30.0–36.0)
MCV: 88.2 fL (ref 78.0–100.0)
Platelets: 168 10*3/uL (ref 150–400)
RBC: 4.14 MIL/uL — ABNORMAL LOW (ref 4.22–5.81)
RDW: 13.6 % (ref 11.5–15.5)
WBC: 8.3 10*3/uL (ref 4.0–10.5)

## 2013-10-15 LAB — BASIC METABOLIC PANEL
ANION GAP: 12 (ref 5–15)
BUN: 16 mg/dL (ref 6–23)
CO2: 28 mEq/L (ref 19–32)
Calcium: 8.7 mg/dL (ref 8.4–10.5)
Chloride: 101 mEq/L (ref 96–112)
Creatinine, Ser: 1.4 mg/dL — ABNORMAL HIGH (ref 0.50–1.35)
GFR calc Af Amer: 60 mL/min — ABNORMAL LOW (ref >90)
GFR, EST NON AFRICAN AMERICAN: 52 mL/min — AB (ref >90)
Glucose, Bld: 107 mg/dL — ABNORMAL HIGH (ref 70–99)
POTASSIUM: 3.8 meq/L (ref 3.7–5.3)
Sodium: 141 mEq/L (ref 137–147)

## 2013-10-15 LAB — D-DIMER, QUANTITATIVE: D-Dimer, Quant: 1.98 ug/mL-FEU — ABNORMAL HIGH (ref 0.00–0.48)

## 2013-10-15 LAB — TROPONIN I
Troponin I: <0.3 ng/mL (ref <0.30)
Troponin I: <0.3 ng/mL (ref <0.30)

## 2013-10-15 LAB — STREP PNEUMONIAE URINARY ANTIGEN: STREP PNEUMO URINARY ANTIGEN: NEGATIVE

## 2013-10-15 MED ORDER — KETOROLAC TROMETHAMINE 30 MG/ML IJ SOLN
30.0000 mg | Freq: Once | INTRAMUSCULAR | Status: AC
  Administered 2013-10-15: 30 mg via INTRAVENOUS
  Filled 2013-10-15: qty 1

## 2013-10-15 MED ORDER — IBUPROFEN 600 MG PO TABS
600.0000 mg | ORAL_TABLET | Freq: Four times a day (QID) | ORAL | Status: DC | PRN
  Administered 2013-10-15 (×2): 600 mg via ORAL
  Filled 2013-10-15 (×3): qty 1

## 2013-10-15 MED ORDER — HEPARIN BOLUS VIA INFUSION
4000.0000 [IU] | Freq: Once | INTRAVENOUS | Status: AC
  Administered 2013-10-15: 4000 [IU] via INTRAVENOUS
  Filled 2013-10-15: qty 4000

## 2013-10-15 MED ORDER — PANTOPRAZOLE SODIUM 40 MG PO TBEC
40.0000 mg | DELAYED_RELEASE_TABLET | Freq: Every day | ORAL | Status: DC
  Administered 2013-10-16 – 2013-10-17 (×2): 40 mg via ORAL
  Filled 2013-10-15: qty 1

## 2013-10-15 MED ORDER — HEPARIN (PORCINE) IN NACL 100-0.45 UNIT/ML-% IJ SOLN
1600.0000 [IU]/h | INTRAMUSCULAR | Status: DC
  Administered 2013-10-16: 1600 [IU]/h via INTRAVENOUS
  Filled 2013-10-15 (×2): qty 250

## 2013-10-15 NOTE — Progress Notes (Signed)
   Full note to follow but he needs a pulmonary consult for L effusion and left lower lobe abnormalities.  Does not need an EGD.  Diet ordered. toradol x 1 also  Gatha Mayer, MD, [facility] [phone] (pager) 10/15/2013 9:20 AM

## 2013-10-15 NOTE — Consult Note (Signed)
Consultation  Referring Provider:      Primary Care Physician:  No PCP Per Patient  Karlton Lemon prior Primary Gastroenterologist:    Benson Norway     Reason for Consultation:          Impression / Plan:   Left pleurtic chest wall pain Consolidative opacities within left lower lobe with small left pleural effusion. linear and ground-glass opacities within the right lower lobe and lingula.  Chronic LUQ pain, on chronic PPI and prior EGD. Mild duodenal thickening on CT   I think his signs and symptoms leading to ED visit and admission are pulmonary in nature. Will consult pulmonary to see this patient as he is quite uncomfortable and has the pulmonary findings on abdominal CT. Call GI Benson Norway or Collene Mares) back if needed. Change to oral PPI          HPI:   Aaron Snyder is a 63 y.o. male with above problems - he awakend Thursday with very sharp left side pain, worse with deep breath and movement. Persisted and eventually came to ED where labs, pCXR unrevealing. CT abd/pelvis ordered due to LUQ pain also -   He has hx of hronic LUQ pain and dsyphagia on PPI - EGD Hung in past, apparently negative.  Now with severe LUQ pain - actually chest wall Pain w/ inspiration and movement Works out qd "light weights" Nothing new, unusual No cough, some dyspnea.   Past Medical History  Diagnosis Date  . Coronary artery disease   . Hypertension   . Head injury   . Dysphagia   . Presence of permanent cardiac pacemaker 02/01/2003    [employer]tr[employer]  . Pacemaker generator end of life 12/18/2011    [employer]  . Syncope     Past Surgical History  Procedure Laterality Date  . Total shoulder arthroplasty    . Pacemaker insertion  2005    [employer]tr[employer]  . Loop recorder implant/explant  2005  . Pacemaker generator change  12/18/2011    [employer]tronic  . Nm myocar perf wall motion  01/2011    Negative    History reviewed. No pertinent family history.   History  Substance Use Topics  . Smoking status:  Former Smoker -- 1.00 packs/day for 15 years    Types: Cigarettes    Quit date: 03/15/1996  . Smokeless tobacco: Never Used  . Alcohol Use: No    Prior to Admission medications   Medication Sig Start Date End Date Taking? Authorizing Provider  acetaminophen (TYLENOL) 325 MG tablet Take 1-2 tablets (325-650 mg total) by mouth every 4 (four) hours as needed. 12/18/11  Yes Cecilie Kicks, NP  amLODipine (NORVASC) 10 MG tablet Take 10 mg by mouth daily. 07/25/13  Yes Historical Provider, MD  atenolol (TENORMIN) 25 MG tablet Take 25 mg by mouth 2 (two) times daily.  12/18/11  Yes Cecilie Kicks, NP  BENICAR 40 MG tablet Take 40 mg by mouth daily. 07/25/13  Yes Historical Provider, MD  chlorthalidone (HYGROTON) 25 MG tablet Take 25 mg by mouth daily. 07/26/13  Yes Historical Provider, MD  diazepam (VALIUM) 5 MG tablet Take 5 mg by mouth daily as needed for anxiety.  10/17/12  Yes Historical Provider, MD  levocetirizine (XYZAL) 5 MG tablet Take 5 mg by mouth every evening.   Yes Historical Provider, MD  lidocaine (LIDODERM) 5 % Place 1 patch onto the skin daily as needed (Depends on how bad back pain is.). Remove & Discard patch within 12 hours  or as directed by MD   Yes Historical Provider, MD  Multiple Vitamins-Minerals (MULTIVITAMIN WITH MINERALS) tablet Take 1 tablet by mouth daily.   Yes Historical Provider, MD    Current Facility-Administered Medications  Medication Dose Route Frequency Provider Last Rate Last Dose  . acetaminophen (TYLENOL) tablet 650 mg  650 mg Oral Q6H PRN Rise Patience, MD       Or  . acetaminophen (TYLENOL) suppository 650 mg  650 mg Rectal Q6H PRN Rise Patience, MD      . amLODipine (NORVASC) tablet 10 mg  10 mg Oral Daily Rise Patience, MD   10 mg at 10/14/13 2344  . atenolol (TENORMIN) tablet 25 mg  25 mg Oral BID Rise Patience, MD   25 mg at 10/14/13 2345  . dextrose 5 % and 0.9 % NaCl with KCl 20 mEq/L infusion   Intravenous Continuous Rise Patience, MD 50 mL/hr at 10/14/13 2347    . diazepam (VALIUM) tablet 5 mg  5 mg Oral Daily PRN Rise Patience, MD      . Influenza vac split quadrivalent PF (FLUARIX) injection 0.5 mL  0.5 mL Intramuscular Tomorrow-1000 Rise Patience, MD      . irbesartan (AVAPRO) tablet 300 mg  300 mg Oral Daily Rise Patience, MD      . levofloxacin (LEVAQUIN) IVPB 750 mg  750 mg Intravenous Q24H Rise Patience, MD   750 mg at 10/14/13 2346  . lidocaine (LIDODERM) 5 % 1 patch  1 patch Transdermal Daily PRN Rise Patience, MD      . morphine 2 MG/ML injection 1 mg  1 mg Intravenous Q4H PRN Rise Patience, MD   1 mg at 10/15/13 0735  . ondansetron (ZOFRAN) tablet 4 mg  4 mg Oral Q6H PRN Rise Patience, MD       Or  . ondansetron [facility]) injection 4 mg  4 mg Intravenous Q6H PRN Rise Patience, MD      . pantoprazole (PROTONIX) injection 40 mg  40 mg Intravenous Q12H Rise Patience, MD   40 mg at 10/14/13 2346    Allergies as of 10/14/2013 - Review Complete 10/14/2013  Allergen Reaction Noted  . Apple Anaphylaxis 12/14/2011  . Carrot [daucus carota] Anaphylaxis 12/14/2011  . Iodinated diagnostic agents Shortness Of Breath and Swelling 03/09/2011  . Oat Anaphylaxis 12/14/2011  . Soy allergy Anaphylaxis 04/30/2011  . Wheat Anaphylaxis 04/30/2011  . Almond meal Itching 12/14/2011  . Cow's milk [lac bovis]  10/14/2013     Review of Systems:    This is positive for those things mentioned in the HPI, also positive for chronic low back pain - #hey want to take 3 discs out", reading glasses All other review of systems are negative.       Physical Exam:  Vital signs in last 24 hours: Temp:  [97.8 F (36.6 C)-98.5 F (36.9 C)] 98.2 F (36.8 C) (09/20 0556) Pulse Rate:  [68-86] 70 (09/20 0556) Resp:  [14-25] 14 (09/20 0556) BP: (111-136)/(60-88) 115/64 mmHg (09/20 0556) SpO2:  [91 %-100 %] 95 % (09/20 0556) Weight:  [220 lb 10.9 oz (100.1 kg)] 220 lb 10.9  oz (100.1 kg) (09/19 2205) Last BM Date: 10/14/13  General:  Well-developed, well-nourished and in no acute distress Eyes:  anicteric. ENT:   Mouth and posterior pharynx free of lesions.  Neck:   supple w/o thyromegaly or mass.  Lungs: Clear to auscultation bilaterally. anterior  Chest wall: Very tender over left anterior chest wall, pectoral area Heart:  S1S2, no rubs, murmurs, gallops. Abdomen:  soft, non-tender, no hepatosplenomegaly, hernia, or mass and BS+.  Lymph:  no cervical or supraclavicular adenopathy. Extremities:   no edema Skin   no rash. Neuro:  A&O x 3.  Psych:  appropriate mood and  Affect.   Data Reviewed:   LAB RESULTS:  Recent Labs  10/14/13 1720 10/15/13 0640  WBC 8.5 8.3  HGB 13.2 12.5*  HCT 38.1* 36.5*  PLT 178 168   BMET  Recent Labs  10/14/13 1720 10/15/13 0640  NA 136* 141  K 3.6* 3.8  CL 96 101  CO2 28 28  GLUCOSE 113* 107*  BUN 20 16  CREATININE 1.39* 1.40*  CALCIUM 9.0 8.7   LFT  Recent Labs  10/14/13 1720  PROT 6.7  ALBUMIN 3.3*  AST 21  ALT 16  ALKPHOS 80  BILITOT 0.5     STUDIES: Ct Abdomen Pelvis Wo Contrast  10/14/2013   CLINICAL DATA:  Severe left upper quadrant pain.  EXAM: CT ABDOMEN AND PELVIS WITHOUT CONTRAST  TECHNIQUE: Multidetector CT imaging of the abdomen and pelvis was performed following the standard protocol without IV contrast.  COMPARISON:  CT 12/18/2007.  FINDINGS: Visualization of the lower thorax demonstrates heterogeneous opacities within the left lower lobe with a small left lower lobe pleural effusion. Additionally there are linear and ground-glass opacities within the right lower lobe and lingula. Normal heart size.  Lack of intravenous contrast material limits evaluation of the solid organ parenchyma.  Non contrast-enhanced liver and gallbladder are unremarkable. Spleen is unremarkable. Mild fatty atrophy of the pancreas. Normal bilateral adrenal glands. Kidneys are symmetric in size. Stable  perinephric fat stranding. Unchanged 5 mm nonobstructing stone inferior pole right kidney. No ureterolithiasis. No hydronephrosis.  Normal caliber abdominal aorta. The urinary bladder is unremarkable. Prostate unremarkable.  No evidence for bowel obstruction. No free fluid or free intraperitoneal air. There is suggestion of wall thickening of the second and third portions of the duodenum.  Multilevel degenerative change of the lumbar spine. No aggressive or acute appearing osseous lesions.  IMPRESSION: Mild wall thickening of second and third portion of the duodenum which may be secondary to duodenitis. Fluid-filled stomach.  Consolidative opacities within left lower lobe with small left pleural effusion. This may represent an infectious process in the appropriate clinical setting.   Electronically Signed   By: Lovey Newcomer M.D.   On: 10/14/2013 19:16   Dg Chest Port 1 View  10/14/2013   CLINICAL DATA:  Left upper quadrant pain.  EXAM: PORTABLE CHEST - 1 VIEW  COMPARISON:  12/14/2011  FINDINGS: Lung volumes are low. Allowing for this, the lungs are clear. Heart, mediastinum hila are unremarkable. Right anterior chest wall sequential pacemaker has its leads superimposed over the right atrium and right ventricle, stable.  No pleural effusion or pneumothorax.  Bony thorax is grossly intact.  IMPRESSION: No acute cardiopulmonary disease.   Electronically Signed   By: Lajean Manes M.D.   On: 10/14/2013 17:02     PREVIOUS ENDOSCOPIES:             EGD Did not query colonoscopy   Thanks   LOS: 1 day   @Lorinda Copland  Simonne Maffucci, MD, [facility] @  10/15/2013, 9:02 AM

## 2013-10-15 NOTE — Progress Notes (Signed)
eLink Physician-Brief Progress Note Patient Name: Aaron Snyder DOB: [birth year] MRN: [id number]   Date of Service  10/15/2013  HPI/Events of Note  D dimer POS  eICU Interventions  Start heparin drip empirically     Intervention Category Intermediate Interventions: Diagnostic test evaluation  Asencion Noble 10/15/2013, 9:51 PM

## 2013-10-15 NOTE — Consult Note (Signed)
Name: Aaron Snyder MRN: [id number] DOB: 1950-07-03    ADMISSION DATE:  10/14/2013 CONSULTATION DATE:  10/15/2013  REFERRING MD :  Carlean Purl, GI PRIMARY SERVICE:  [facility]  CHIEF COMPLAINT:  LUQ , left chest pain   SIGNIFICANT EVENTS / STUDIES:  9/19 CT abdomen/pelvis without contrast- thickening of duodenum, minimal  left lower lobe consolidation with small effusion   HISTORY OF PRESENT ILLNESS:  63 year old remote smoker, presented with 2 days of left-sided, lower rib cage pain radiating to left upper quadrant. He reports sudden onset pain that woke him up from sleep, subsided within 10 minutes spontaneously. He was able to go to the gym next a.m. and workout. When pain persisted the next day, he sought evaluation in the ED. Reports pain with deep inspiration and movement, he works out with light weights, less than 20 pounds due to shoulder injury. He reports mild dyspnea on exertion, but is able to do cardio the gym. He denies cough or fevers. He has a history of dysphagia, apparently EGD by Dr. Benson Norway has been negative in the past. CT abdomen was as reported above. GI consult and does not think that the duodenitis is a significant problem Labs showed creatinine of 1.4 with GFR of 60, he states that he has never been told of kidney disease. He reports severe  allergy to IV contrast.  He smoked about 20 pack years before he quit in 1990. He is disabled since the 90s  after a head injury  PAST MEDICAL HISTORY :  Past Medical History  Diagnosis Date  . Coronary artery disease   . Hypertension   . Head injury   . Dysphagia   . Presence of permanent cardiac pacemaker 02/01/2003    [employer]tr[employer]  . Pacemaker generator end of life 12/18/2011    [employer]  . Syncope    Past Surgical History  Procedure Laterality Date  . Total shoulder arthroplasty    . Pacemaker insertion  2005    [employer]tr[employer]  . Loop recorder implant/explant  2005  . Pacemaker generator change  12/18/2011    [employer]tr[employer]  .  Nm myocar perf wall motion  01/2011    Negative  . Esophagogastroduodenoscopy      Benson Norway   Prior to Admission medications   Medication Sig Start Date End Date Taking? Authorizing Provider  acetaminophen (TYLENOL) 325 MG tablet Take 1-2 tablets (325-650 mg total) by mouth every 4 (four) hours as needed. 12/18/11  Yes Cecilie Kicks, NP  amLODipine (NORVASC) 10 MG tablet Take 10 mg by mouth daily. 07/25/13  Yes Historical Provider, MD  atenolol (TENORMIN) 25 MG tablet Take 25 mg by mouth 2 (two) times daily.  12/18/11  Yes Cecilie Kicks, NP  BENICAR 40 MG tablet Take 40 mg by mouth daily. 07/25/13  Yes Historical Provider, MD  chlorthalidone (HYGROTON) 25 MG tablet Take 25 mg by mouth daily. 07/26/13  Yes Historical Provider, MD  diazepam (VALIUM) 5 MG tablet Take 5 mg by mouth daily as needed for anxiety.  10/17/12  Yes Historical Provider, MD  levocetirizine (XYZAL) 5 MG tablet Take 5 mg by mouth every evening.   Yes Historical Provider, MD  lidocaine (LIDODERM) 5 % Place 1 patch onto the skin daily as needed (Depends on how bad back pain is.). Remove & Discard patch within 12 hours or as directed by MD   Yes Historical Provider, MD  Multiple Vitamins-Minerals (MULTIVITAMIN WITH MINERALS) tablet Take 1 tablet by mouth daily.   Yes Historical Provider, MD  Allergies  Allergen Reactions  . Apple Anaphylaxis  . Carrot [Daucus Carota] Anaphylaxis  . Iodinated Diagnostic Agents Shortness Of Breath and Swelling    Can have contrast as long as he has a 13 prep  . Oat Anaphylaxis  . Soy Allergy Anaphylaxis  . Wheat Anaphylaxis  . Almond Meal Itching  . Cow's Milk [Lac Bovis]     Hives, and upset stomach     FAMILY HISTORY:  History reviewed. No pertinent family history. SOCIAL HISTORY:  reports that he quit smoking about 17 years ago. His smoking use included Cigarettes. He has a 15 pack-year smoking history. He has never used smokeless tobacco. He reports that he does not drink alcohol or use  illicit drugs.  REVIEW OF SYSTEMS:   Constitutional: Negative for fever, chills, weight loss, malaise/fatigue and diaphoresis.  HENT: Negative for hearing loss, ear pain, nosebleeds, congestion, sore throat, neck pain, tinnitus and ear discharge.   Eyes: Negative for blurred vision, double vision, photophobia, pain, discharge and redness.  Respiratory: Negative for cough, hemoptysis, sputum production,  wheezing and stridor.   Cardiovascular: Negative for  palpitations, orthopnea, claudication, leg swelling and PND.  Gastrointestinal: Negative for heartburn, nausea, vomiting, abdominal pain, diarrhea,  blood in stool and melena. reports constipation, Genitourinary: Negative for dysuria, urgency, frequency, hematuria and flank pain.  Musculoskeletal: Negative for myalgias, back pain, joint pain and falls.  Skin: Negative for itching and rash.  Neurological: Negative for dizziness, tingling, tremors, sensory change, speech change, focal weakness, seizures, loss of consciousness, weakness and headaches.  Endo/Heme/Allergies: Negative for environmental allergies and polydipsia. Does not bruise/bleed easily.  SUBJECTIVE:   VITAL SIGNS: Temp:  [97.8 F (36.6 C)-98.5 F (36.9 C)] 98.2 F (36.8 C) (09/20 0556) Pulse Rate:  [68-81] 70 (09/20 0556) Resp:  [14-21] 14 (09/20 0556) BP: (111-136)/(60-86) 115/64 mmHg (09/20 0556) SpO2:  [91 %-100 %] 95 % (09/20 0556) Weight:  [100.1 kg (220 lb 10.9 oz)] 100.1 kg (220 lb 10.9 oz) (09/19 2205)  PHYSICAL EXAMINATION: Gen. Pleasant, well-nourished, in no distress, normal affect ENT - no lesions, no post nasal drip Neck: No JVD, no thyromegaly, no carotid bruits Lungs: no use of accessory muscles, no dullness to percussion,decreased left base without rales or rhonchi  Cardiovascular: Rhythm regular, heart sounds  normal, no murmurs, no peripheral edema Abdomen: soft and non-tender, no hepatosplenomegaly, BS normal. Musculoskeletal: No deformities, no  cyanosis or clubbing Neuro:  alert, non focal Skin:  Warm, no lesions/ rash    Recent Labs Lab 10/14/13 1720 10/15/13 0640  NA 136* 141  K 3.6* 3.8  CL 96 101  CO2 28 28  BUN 20 16  CREATININE 1.39* 1.40*  GLUCOSE 113* 107*    Recent Labs Lab 10/14/13 1720 10/15/13 0640  HGB 13.2 12.5*  HCT 38.1* 36.5*  WBC 8.5 8.3  PLT 178 168   Ct Abdomen Pelvis Wo Contrast  10/14/2013   CLINICAL DATA:  Severe left upper quadrant pain.  EXAM: CT ABDOMEN AND PELVIS WITHOUT CONTRAST  TECHNIQUE: Multidetector CT imaging of the abdomen and pelvis was performed following the standard protocol without IV contrast.  COMPARISON:  CT 12/18/2007.  FINDINGS: Visualization of the lower thorax demonstrates heterogeneous opacities within the left lower lobe with a small left lower lobe pleural effusion. Additionally there are linear and ground-glass opacities within the right lower lobe and lingula. Normal heart size.  Lack of intravenous contrast material limits evaluation of the solid organ parenchyma.  Non contrast-enhanced liver and gallbladder are  unremarkable. Spleen is unremarkable. Mild fatty atrophy of the pancreas. Normal bilateral adrenal glands. Kidneys are symmetric in size. Stable perinephric fat stranding. Unchanged 5 mm nonobstructing stone inferior pole right kidney. No ureterolithiasis. No hydronephrosis.  Normal caliber abdominal aorta. The urinary bladder is unremarkable. Prostate unremarkable.  No evidence for bowel obstruction. No free fluid or free intraperitoneal air. There is suggestion of wall thickening of the second and third portions of the duodenum.  Multilevel degenerative change of the lumbar spine. No aggressive or acute appearing osseous lesions.  IMPRESSION: Mild wall thickening of second and third portion of the duodenum which may be secondary to duodenitis. Fluid-filled stomach.  Consolidative opacities within left lower lobe with small left pleural effusion. This may represent  an infectious process in the appropriate clinical setting.   Electronically Signed   By: Lovey Newcomer M.D.   On: 10/14/2013 19:16   Dg Chest Port 1 View  10/14/2013   CLINICAL DATA:  Left upper quadrant pain.  EXAM: PORTABLE CHEST - 1 VIEW  COMPARISON:  12/14/2011  FINDINGS: Lung volumes are low. Allowing for this, the lungs are clear. Heart, mediastinum hila are unremarkable. Right anterior chest wall sequential pacemaker has its leads superimposed over the right atrium and right ventricle, stable.  No pleural effusion or pneumothorax.  Bony thorax is grossly intact.  IMPRESSION: No acute cardiopulmonary disease.   Electronically Signed   By: Lajean Manes M.D.   On: 10/14/2013 17:02    ASSESSMENT / PLAN:  Left lower lobe consolidation and small left effusion -Unclear cause, not obviously infectious since no fever or leukocytosis or sputum production -Does not seem to be reactive in the absence of abdominal pathology -Could be related to pleurisy -Low clinical probability of Pulmonary embolism, note severe contrast allergy -Aspiration, although possible, would be uncommon in the left lower lobe  Recommend-  -Agree with Levaquin -Check d-dimer, if positive -could proceed with VQ scan and would anticoagulate while awaiting If negative d-dimer, can rule out pulmonary embolism as the cause -Hold ARB, he was quite surprised to hear about kidney disease     Kara Mead MD. [facility] Flood. [facility] Pager [phone] If no response call [phone]    10/15/2013, 3:59 PM

## 2013-10-15 NOTE — Progress Notes (Signed)
Utilization Review Complete Llana Aliment, RN, BSN Nurse Case Manager

## 2013-10-15 NOTE — Progress Notes (Signed)
Note: This document was prepared with digital dictation and possible smart phrase technology. Any transcriptional errors that result from this process are unintentional.   Aaron Snyder JME:[id number] DOB: Nov 05, 1950 DOA: 10/14/2013 PCP: No PCP Per Patient  Brief narrative: 63 y/o ? h/o UA s/p cath non-obs 11/2010, PPM 12/2002 for pauses/syncope, Htn, chronic dysphagia admitted 9/19 c LUQ pain and discomfort on taking deep breaths.  No n/v/or fever chills or sputum.  Doesn;t endorse GI symtpoms of diarrea.  njot like his CP previously when he had angina  Past medical history-As per Problem list Chart reviewed as below- reviewed  Consultants:  GI  Pulm [called by gi]  Procedures:  Ct  CXR  Antibiotics:  None yet   Subjective   alert- Deep breath=pain Started 3 night ago..  NO fevers   Objective    Interim History:   Telemetry:    Objective: Filed Vitals:   10/14/13 2140 10/14/13 2205 10/14/13 2348 10/15/13 0556  BP: 117/60 136/86 126/76 115/64  Pulse: 76 75 71 70  Temp: 98.5 F (36.9 C) 98.2 F (36.8 C)  98.2 F (36.8 C)  TempSrc: Oral Oral  Oral  Resp:  20  14  Height:  5\' 11"  (1.803 m)    Weight:  100.1 kg (220 lb 10.9 oz)    SpO2: 100% 94%  95%    Intake/Output Summary (Last 24 hours) at 10/15/13 1139 Last data filed at 10/15/13 0900  Gross per 24 hour  Intake    480 ml  Output    380 ml  Net    100 ml    Exam:  General: alert pleasant oriented in nad currently Cardiovascular: s1 s2 no m/r/g Respiratory: clear no added sound, no CVa tenderness Abdomen: no splenomegallyy or hepatomegally [facility] intact  Data Reviewed: Basic Metabolic Panel:  Recent Labs Lab 10/14/13 1720 10/15/13 0640  NA 136* 141  K 3.6* 3.8  CL 96 101  CO2 28 28  GLUCOSE 113* 107*  BUN 20 16  CREATININE 1.39* 1.40*  CALCIUM 9.0 8.7   Liver Function Tests:  Recent Labs Lab 10/14/13 1720  AST 21  ALT 16  ALKPHOS 80  BILITOT 0.5  PROT 6.7    ALBUMIN 3.3*    Recent Labs Lab 10/14/13 1720  LIPASE 24   No results found for this basename: AMMONIA,  in the last 168 hours CBC:  Recent Labs Lab 10/14/13 1720 10/15/13 0640  WBC 8.5 8.3  NEUTROABS 6.1  --   HGB 13.2 12.5*  HCT 38.1* 36.5*  MCV 86.6 88.2  PLT 178 168   Cardiac Enzymes:  Recent Labs Lab 10/14/13 0011 10/14/13 1720 10/15/13 0640  TROPONINI <0.30 <0.30 <0.30   BNP: No components found with this basename: POCBNP,  CBG:  Recent Labs Lab 10/15/13 0022 10/15/13 0540  GLUCAP 131* 128*    No results found for this or any previous visit (from the past 240 hour(s)).   Studies:              All Imaging reviewed and is as per above notation   Scheduled Meds: . amLODipine  10 mg Oral Daily  . atenolol  25 mg Oral BID  . Influenza vac split quadrivalent PF  0.5 mL Intramuscular Tomorrow-1000  . irbesartan  300 mg Oral Daily  . levofloxacin (LEVAQUIN) IV  750 mg Intravenous Q24H  . pantoprazole  40 mg Oral Daily   Continuous Infusions: . dextrose 5 % and 0.9 % NaCl  with KCl 20 mEq/L 50 mL/hr at 10/14/13 2347     Assessment/Plan: 1. LUQ pain-most likely pleurisy from possible P{NA.  Start CAP rx levaquin IV.  Rpt CXr in am.  Pulm consulted by GI.  Add ibuprofen for somatic pain.  Reassess in am 2. UA s/p non obstructive cath 2012-Cycle enzymes.  Unlikely anginal equiv as dissimilar to his original cardiac pain. 3. Htn-continue Irbesartan 300 qd, Atenolol 25 bid, amlodipine 10 daily.  Chlorthalidone on hold 4. Bradycardia dn pauses s/p PPM-stable 5. Acute on chronic Stage 2 CKD-continue D5 fluids overnight   Tele [phone]   Verneita Griffes, MD  [facility] Pager [phone] 10/15/2013, 11:39 AM    LOS: 1 day

## 2013-10-15 NOTE — Progress Notes (Signed)
ANTICOAGULATION CONSULT NOTE - Initial Consult  Pharmacy Consult for heparin Indication: R/o PE  Allergies  Allergen Reactions  . Apple Anaphylaxis  . Carrot [Daucus Carota] Anaphylaxis  . Iodinated Diagnostic Agents Shortness Of Breath and Swelling    Can have contrast as long as he has a 13 prep  . Oat Anaphylaxis  . Soy Allergy Anaphylaxis  . Wheat Anaphylaxis  . Almond Meal Itching  . Cow's Milk [Lac Bovis]     Hives, and upset stomach     Patient Measurements: Height: 5\' 11"  (180.3 cm) Weight: 220 lb 10.9 oz (100.1 kg) IBW/kg (Calculated) : 75.3 Heparin Dosing Weight: 100 kg  Vital Signs: Temp: 98.3 F (36.8 C) (09/20 2112) Temp src: Oral (09/20 2112) BP: 128/76 mmHg (09/20 2112) Pulse Rate: 73 (09/20 2112)  Labs:  Recent Labs  10/14/13 1720 10/15/13 0640 10/15/13 1118  HGB 13.2 12.5*  --   HCT 38.1* 36.5*  --   PLT 178 168  --   CREATININE 1.39* 1.40*  --   TROPONINI <0.30 <0.30 <0.30    Estimated Creatinine Clearance: 64.2 ml/min (by C-G formula based on Cr of 1.4).   Medical History: Past Medical History  Diagnosis Date  . Coronary artery disease   . Hypertension   . Head injury   . Dysphagia   . Presence of permanent cardiac pacemaker 02/01/2003    [employer]  . Pacemaker generator end of life 12/18/2011    [employer]  . Syncope     Medications:  Scheduled:  . amLODipine  10 mg Oral Daily  . atenolol  25 mg Oral BID  . Influenza vac split quadrivalent PF  0.5 mL Intramuscular Tomorrow-1000  . levofloxacin (LEVAQUIN) IV  750 mg Intravenous Q24H  . pantoprazole  40 mg Oral Daily   Infusions:  . dextrose 5 % and 0.9 % NaCl with KCl 20 mEq/L 50 mL/hr at 10/15/13 2103    Assessment: 63 yo who has been here for upper quadrant pain. His D-dimer is elevated so heparin has been ordered to r/o PE. Plan is for a V/Q scan.   Goal of Therapy:  Heparin level 0.3-0.7 units/ml Monitor platelets by anticoagulation protocol: Yes   Plan:    Heparin bolus 4000 units x1 Heparin drip at 1600 units/hr F/u with level in AM Daily level and CBC  Onnie Boer, PharmD Pager: [phone] 10/15/2013 10:17 PM

## 2013-10-15 NOTE — Progress Notes (Signed)
[facility] Progress Note Patient Name: Aaron Snyder DOB: 1950/05/30 MRN: [id number]   Date of Service  10/15/2013  HPI/Events of Note  D dimer elevated  eICU Interventions  V/q scan ordered     Intervention Category Intermediate Interventions: Diagnostic test evaluation  Asencion Noble 10/15/2013, 8:26 PM

## 2013-10-15 NOTE — Progress Notes (Signed)
NURSING PROGRESS NOTE  HARDEEP REETZ [id number] Admission Data: 10/15/2013 2:00 AM Attending Provider: Rise Patience, MD PCP:No PCP Per Patient Code Status: Full   RONAK DUQUETTE is a 63 y.o. male patient admitted from ED:  -No acute distress noted.  -No complaints of shortness of breath.  -No complaints of chest pain.   Cardiac Monitoring: n/a  Vitals: Blood pressure 126/76, pulse 71, temperature 98.2 F (36.8 C), temperature source Oral, resp. rate 20, height 5\' 11"  (1.803 m), weight 100.1 kg (220 lb 10.9 oz), SpO2 94.00%.   IV Fluids:  IV in place, occlusive dsg intact without redness, IV cath forearm left, condition patent and no redness D5W/0.9 NaCl. With 20 mEq KCl  Allergies:  Apple; Carrot; Iodinated diagnostic agents; Oat; Soy allergy; Wheat; Almond meal; and Cow's milk  Past Medical History:   has a past medical history of Coronary artery disease; Hypertension; Head injury; Dysphagia; Presence of permanent cardiac pacemaker (02/01/2003); Pacemaker generator end of life (12/18/2011); and Syncope.  Past Surgical History:   has past surgical history that includes Total shoulder arthroplasty; Pacemaker insertion (2005); LOOP RECORDER IMPLANT/EXPLANT (2005); Pacemaker generator change (12/18/2011); and nm myocar perf wall motion (01/2011).  Social History:   reports that he quit smoking about 17 years ago. His smoking use included Cigarettes. He has a 15 pack-year smoking history. He has never used smokeless tobacco. He reports that he does not drink alcohol or use illicit drugs.  Skin: intact  Patient/Family orientated to room. Information packet given to patient/family. Admission inpatient armband information verified with patient/family to include name and date of birth and placed on patient arm. Side rails up x 1, fall assessment and education completed with patient/family. Patient/family able to verbalize understanding of risk associated with falls and verbalized  understanding to call for assistance before getting out of bed. Call light within reach. Patient/family able to voice and demonstrate understanding of unit orientation instructions.

## 2013-10-16 ENCOUNTER — Observation Stay (HOSPITAL_COMMUNITY): Payer: Medicare HMO

## 2013-10-16 DIAGNOSIS — J189 Pneumonia, unspecified organism: Secondary | ICD-10-CM | POA: Diagnosis not present

## 2013-10-16 LAB — GLUCOSE, CAPILLARY
Glucose-Capillary: 122 mg/dL — ABNORMAL HIGH (ref 70–99)
Glucose-Capillary: 93 mg/dL (ref 70–99)
Glucose-Capillary: 93 mg/dL (ref 70–99)

## 2013-10-16 LAB — HEPARIN LEVEL (UNFRACTIONATED): Heparin Unfractionated: 1.14 IU/mL — ABNORMAL HIGH (ref 0.30–0.70)

## 2013-10-16 LAB — BASIC METABOLIC PANEL
ANION GAP: 14 (ref 5–15)
BUN: 16 mg/dL (ref 6–23)
CALCIUM: 9.5 mg/dL (ref 8.4–10.5)
CO2: 28 meq/L (ref 19–32)
Chloride: 100 mEq/L (ref 96–112)
Creatinine, Ser: 1.43 mg/dL — ABNORMAL HIGH (ref 0.50–1.35)
GFR calc non Af Amer: 50 mL/min — ABNORMAL LOW (ref >90)
GFR, EST AFRICAN AMERICAN: 58 mL/min — AB (ref >90)
Glucose, Bld: 98 mg/dL (ref 70–99)
Potassium: 3.8 mEq/L (ref 3.7–5.3)
SODIUM: 142 meq/L (ref 137–147)

## 2013-10-16 LAB — LEGIONELLA ANTIGEN, URINE: Legionella Antigen, Urine: NEGATIVE

## 2013-10-16 LAB — CBC
HCT: 39.6 % (ref 39.0–52.0)
Hemoglobin: 12.5 g/dL — ABNORMAL LOW (ref 13.0–17.0)
MCH: 27.7 pg (ref 26.0–34.0)
MCHC: 31.6 g/dL (ref 30.0–36.0)
MCV: 87.6 fL (ref 78.0–100.0)
Platelets: 201 10*3/uL (ref 150–400)
RBC: 4.52 MIL/uL (ref 4.22–5.81)
RDW: 13.4 % (ref 11.5–15.5)
WBC: 7.9 10*3/uL (ref 4.0–10.5)

## 2013-10-16 MED ORDER — TECHNETIUM TO 99M ALBUMIN AGGREGATED
6.0000 | Freq: Once | INTRAVENOUS | Status: AC | PRN
Start: 2013-10-16 — End: 2013-10-16
  Administered 2013-10-16: 6 via INTRAVENOUS

## 2013-10-16 MED ORDER — LEVOFLOXACIN 500 MG PO TABS: 500.0000 mg | ORAL_TABLET | Freq: Every day | ORAL | Status: DC

## 2013-10-16 MED ORDER — TECHNETIUM TC 99M DIETHYLENETRIAME-PENTAACETIC ACID: 40.0000 | Freq: Once | INTRAVENOUS | Status: AC | PRN

## 2013-10-16 MED ORDER — LEVOFLOXACIN 750 MG PO TABS
750.0000 mg | ORAL_TABLET | Freq: Every day | ORAL | Status: DC
  Administered 2013-10-16: 750 mg via ORAL
  Filled 2013-10-16 (×2): qty 1

## 2013-10-16 MED ORDER — IBUPROFEN 400 MG PO TABS
400.0000 mg | ORAL_TABLET | Freq: Three times a day (TID) | ORAL | Status: DC | PRN
  Administered 2013-10-16: 400 mg via ORAL
  Filled 2013-10-16: qty 1

## 2013-10-16 NOTE — Progress Notes (Signed)
Name: Aaron Snyder MRN: [id number] DOB: 22-Aug-1950    ADMISSION DATE:  10/14/2013 CONSULTATION DATE:  10/15/2013  CHIEF COMPLAINT:  LUQ , left chest pain  BRIEF DESCRIPTION: 63 yo male former smoker with Lt upper quadrant abdominal pain and pleurisy.  Found to have LLL infiltrate and effusion.  PCCM asked to assist with assessment.  STUDIES:  9/19 CT abdomen/pelvis without contrast >>  thickening of duodenum, minimal  left lower lobe consolidation with small effusion 9/20  D-Dimer  Elevated 9/21  VQ scan- no PE   SUBJECTIVE:   Feels better.  Still has Lt sided pain, but decreased.  VITAL SIGNS: Temp:  [98 F (36.7 C)-98.3 F (36.8 C)] 98 F (36.7 C) (09/21 0558) Pulse Rate:  [72-78] 78 (09/21 1152) Resp:  [17-20] 17 (09/21 0558) BP: (124-141)/(74-83) 141/83 mmHg (09/21 1152) SpO2:  [97 %-98 %] 97 % (09/21 0558)  PHYSICAL EXAMINATION: Gen. Pleasant, well-nourished, in apparent discomfort with movement, normal affect ENT - no lesions, no post nasal drip Neck: No JVD, no thyromegaly, no carotid bruits Lungs: no use of accessory muscles, CTA Cardiovascular: Rhythm regular, heart sounds  normal, no murmurs, no peripheral edema Abdomen: soft and non-tender, BSx 4 Musculoskeletal: No deformities, no cyanosis or clubbing Neuro:  alert, non focal Skin:  Warm, no lesions/ rash    Recent Labs Lab 10/14/13 1720 10/15/13 0640  NA 136* 141  K 3.6* 3.8  CL 96 101  CO2 28 28  BUN 20 16  CREATININE 1.39* 1.40*  GLUCOSE 113* 107*    Recent Labs Lab 10/14/13 1720 10/15/13 0640 10/16/13 0945  HGB 13.2 12.5* 12.5*  HCT 38.1* 36.5* 39.6  WBC 8.5 8.3 7.9  PLT 178 168 201   Ct Abdomen Pelvis Wo Contrast  10/14/2013   CLINICAL DATA:  Severe left upper quadrant pain.  EXAM: CT ABDOMEN AND PELVIS WITHOUT CONTRAST  TECHNIQUE: Multidetector CT imaging of the abdomen and pelvis was performed following the standard protocol without IV contrast.  COMPARISON:  CT 12/18/2007.   FINDINGS: Visualization of the lower thorax demonstrates heterogeneous opacities within the left lower lobe with a small left lower lobe pleural effusion. Additionally there are linear and ground-glass opacities within the right lower lobe and lingula. Normal heart size.  Lack of intravenous contrast material limits evaluation of the solid organ parenchyma.  Non contrast-enhanced liver and gallbladder are unremarkable. Spleen is unremarkable. Mild fatty atrophy of the pancreas. Normal bilateral adrenal glands. Kidneys are symmetric in size. Stable perinephric fat stranding. Unchanged 5 mm nonobstructing stone inferior pole right kidney. No ureterolithiasis. No hydronephrosis.  Normal caliber abdominal aorta. The urinary bladder is unremarkable. Prostate unremarkable.  No evidence for bowel obstruction. No free fluid or free intraperitoneal air. There is suggestion of wall thickening of the second and third portions of the duodenum.  Multilevel degenerative change of the lumbar spine. No aggressive or acute appearing osseous lesions.  IMPRESSION: Mild wall thickening of second and third portion of the duodenum which may be secondary to duodenitis. Fluid-filled stomach.  Consolidative opacities within left lower lobe with small left pleural effusion. This may represent an infectious process in the appropriate clinical setting.   Electronically Signed   By: Lovey Newcomer M.D.   On: 10/14/2013 19:16   Dg Chest 2 View  10/16/2013   CLINICAL DATA:  Pleural effusion  EXAM: CHEST  2 VIEW  COMPARISON:  CT 10/14/2013  FINDINGS: Right-sided pacemaker overlies normal cardiac silhouette. Small left effusion is unchanged. There is  bibasilar atelectasis also unchanged from comparison abdominal CT. No pulmonary edema. No pneumothorax.  IMPRESSION: 1. No interval change. 2. Small left effusion and bibasilar atelectasis.   Electronically Signed   By: Suzy Bouchard M.D.   On: 10/16/2013 12:06   Nm Pulmonary Perf And  Vent  10/16/2013   CLINICAL DATA:  Chest pain, short of breath, concern for pulmonary embolism  EXAM: NUCLEAR MEDICINE VENTILATION - PERFUSION LUNG SCAN  TECHNIQUE: Ventilation images were obtained in multiple projections using inhaled aerosol technetium 99 M DTPA. Perfusion images were obtained in multiple projections after intravenous injection of Tc-40m MAA.  RADIOPHARMACEUTICALS:  Forty mCi Tc-65m DTPA aerosol and 6.0 mCi Tc-7m MAA  COMPARISON:  V/Q scan 12/15/2011, radiograph 10/16/2013  FINDINGS: Ventilation: No focal ventilation defect.  Perfusion: No wedge shaped peripheral perfusion defects to suggest acute pulmonary embolism.  IMPRESSION: No evidence of pulmonary embolism.   Electronically Signed   By: Suzy Bouchard M.D.   On: 10/16/2013 11:39   Dg Chest Port 1 View  10/14/2013   CLINICAL DATA:  Left upper quadrant pain.  EXAM: PORTABLE CHEST - 1 VIEW  COMPARISON:  12/14/2011  FINDINGS: Lung volumes are low. Allowing for this, the lungs are clear. Heart, mediastinum hila are unremarkable. Right anterior chest wall sequential pacemaker has its leads superimposed over the right atrium and right ventricle, stable.  No pleural effusion or pneumothorax.  Bony thorax is grossly intact.  IMPRESSION: No acute cardiopulmonary disease.   Electronically Signed   By: Lajean Manes M.D.   On: 10/14/2013 17:02    ASSESSMENT / PLAN:  Left lower lobe consolidation and small left effusion likely from PNA with pleurisy. Plan: abx per primary team Prn NSAIDS Will need f/u CXR as outpt in 1 to 2 weeks  Magdalene River, PA-S  [facility]   10/16/2013, 12:47 PM  Reviewed above, examined.    V/Q scan negative.  Can d/c heparin.  Continue Abx for PNA per primary team.  PRN NSAIDS for pleuritic chest pain.  PCCM will sign off.  Chesley Mires, MD [facility] Pulmonary/Critical Care 10/16/2013, 2:51 PM Pager:  [phone] After 3pm call: [phone]

## 2013-10-16 NOTE — Progress Notes (Signed)
Pt clarified with MD and Nuclear Med about allergies. Pt's concerns addressed. No new orders needed.

## 2013-10-16 NOTE — Progress Notes (Signed)
CARE MANAGEMENT NOTE 10/16/2013  Patient:  Aaron Snyder, Aaron Snyder   Account Number:  [id number]  Date Initiated:  10/16/2013  Documentation initiated by:  Tomi Bamberger  Subjective/Objective Assessment:   admit- lives alone.     Action/Plan:   Anticipated DC Date:  10/17/2013   Anticipated DC Plan:  [place]  CM consult      Choice offered to / List presented to:             Status of service:  In process, will continue to follow Medicare Important Message given?  YES (If response is "NO", the following Medicare IM given date fields will be blank) Date Medicare IM given:  10/16/2013 Medicare IM given by:  Tomi Bamberger Date Additional Medicare IM given:   Additional Medicare IM given by:    Discharge Disposition:    Per UR Regulation:  Reviewed for med. necessity/level of care/duration of stay  If discussed at [place] of Stay Meetings, dates discussed:    Comments:

## 2013-10-16 NOTE — Progress Notes (Signed)
Note: This document was prepared with digital dictation and possible smart phrase technology. Any transcriptional errors that result from this process are unintentional.   Aaron Snyder HYQ:[id number] DOB: 02-19-1950 DOA: 10/14/2013 PCP: No PCP Per Patient  Brief narrative: 63 y/o ? h/o UA s/p cath non-obs 11/2010, PPM 12/2002 for pauses/syncope, Htn, chronic dysphagia admitted 9/19 c LUQ pain and discomfort on taking deep breaths.  No n/v/or fever chills or sputum.  Doesn't endorse GI symtpoms of diarrea.  not like his CP previously when he had angina  Past medical history-As per Problem list Chart reviewed as below- reviewed  Consultants:  GI  Pulm [called by gi]  Procedures:  Ct  CXR  Antibiotics:  None yet   Subjective   alert Pain better with ibuprofen No sob, n,v tol diet deneis fevr chills ill contacts, unusual exposures or recent exotic travel  Objective    Interim History:   Telemetry:    Objective: Filed Vitals:   10/15/13 1700 10/15/13 2112 10/16/13 0558 10/16/13 1152  BP: 130/74 128/76 124/75 141/83  Pulse: 72 73 72 78  Temp: 98.2 F (36.8 C) 98.3 F (36.8 C) 98 F (36.7 C)   TempSrc: Oral Oral Oral   Resp: 20 18 17    Height:      Weight:      SpO2: 98% 98% 97%     Intake/Output Summary (Last 24 hours) at 10/16/13 1407 Last data filed at 10/16/13 0917  Gross per 24 hour  Intake      0 ml  Output    950 ml  Net   -950 ml    Exam:  General: alert pleasant oriented in nad currently Cardiovascular: s1 s2 no m/r/g Respiratory: clear no added sound, no CVa tenderness Abdomen: no splenomegallyy or hepatomegally [place] Neuro intact  Data Reviewed: Basic Metabolic Panel:  Recent Labs Lab 10/14/13 1720 10/15/13 0640  NA 136* 141  K 3.6* 3.8  CL 96 101  CO2 28 28  GLUCOSE 113* 107*  BUN 20 16  CREATININE 1.39* 1.40*  CALCIUM 9.0 8.7   Liver Function Tests:  Recent Labs Lab 10/14/13 1720  AST 21  ALT 16  ALKPHOS  80  BILITOT 0.5  PROT 6.7  ALBUMIN 3.3*    Recent Labs Lab 10/14/13 1720  LIPASE 24   No results found for this basename: AMMONIA,  in the last 168 hours CBC:  Recent Labs Lab 10/14/13 1720 10/15/13 0640 10/16/13 0945  WBC 8.5 8.3 7.9  NEUTROABS 6.1  --   --   HGB 13.2 12.5* 12.5*  HCT 38.1* 36.5* 39.6  MCV 86.6 88.2 87.6  PLT 178 168 201   Cardiac Enzymes:  Recent Labs Lab 10/14/13 0011 10/14/13 1720 10/15/13 0640 10/15/13 1118  TROPONINI <0.30 <0.30 <0.30 <0.30   BNP: No components found with this basename: POCBNP,  CBG:  Recent Labs Lab 10/15/13 1219 10/15/13 1852 10/16/13 0023 10/16/13 0602 10/16/13 1152  GLUCAP 110* 156* 122* 93 93    No results found for this or any previous visit (from the past 240 hour(s)).   Studies:              All Imaging reviewed and is as per above notation   Scheduled Meds: . amLODipine  10 mg Oral Daily  . atenolol  25 mg Oral BID  . levofloxacin (LEVAQUIN) IV  750 mg Intravenous Q24H  . pantoprazole  40 mg Oral Daily   Continuous Infusions:  Assessment/Plan: 1. LUQ pain-most likely pleurisy from possible PNA.  Start CAP rx levaquin IV-->changed to PO.  Rpt CXr shows only atelectasis.  Unclear etiology to pain-could be possible occult pna-continue 5 days po levaquin.  V/Q scan neg.  D/c heparin Gtt-cautious use Ibuprofen in setting mild aki 2. UA s/p non obstructive cath 2012-Cycle enzymes.  Unlikely anginal equiv as dissimilar to his original cardiac pain and cardiac enzymes neg 3. Htn-continue Irbesartan 300 qd, Atenolol 25 bid, amlodipine 10 daily.  Chlorthalidone on hold 4. Bradycardia dn pauses s/p PPM-stable 5. Acute on chronic Stage 2 CKD-continue D5 fluids overnight-understands might have htn renal disease and counslled  Tele [phone] Likely d/c home if pain better in am   Verneita Griffes, MD  [facility] Pager [phone] 10/16/2013, 2:07 PM    LOS: 2 days

## 2013-10-16 NOTE — Progress Notes (Signed)
ANTICOAGULATION CONSULT NOTE - Follow Up Consult  Pharmacy Consult for heparin Indication: r/o PE  Allergies  Allergen Reactions  . Apple Anaphylaxis  . Carrot [Daucus Carota] Anaphylaxis  . Iodinated Diagnostic Agents Shortness Of Breath and Swelling    Can have contrast as long as he has a 13 prep  . Iohexol Shortness Of Breath and Swelling    Pt requires 13hr premeds.  . Oat Anaphylaxis  . Soy Allergy Anaphylaxis  . Wheat Anaphylaxis  . Almond Meal Itching  . Cow's Milk [Lac Bovis]     Hives, and upset stomach     Patient Measurements: Height: 5\' 11"  (180.3 cm) Weight: 220 lb 10.9 oz (100.1 kg) IBW/kg (Calculated) : 75.3 Heparin Dosing Weight: 100kg  Vital Signs: Temp: 98 F (36.7 C) (09/21 0558) Temp src: Oral (09/21 0558) BP: 141/83 mmHg (09/21 1152) Pulse Rate: 78 (09/21 1152)  Labs:  Recent Labs  10/14/13 1720 10/15/13 0640 10/15/13 1118 10/16/13 0945  HGB 13.2 12.5*  --  12.5*  HCT 38.1* 36.5*  --  39.6  PLT 178 168  --  201  HEPARINUNFRC  --   --   --  1.14*  CREATININE 1.39* 1.40*  --   --   TROPONINI <0.30 <0.30 <0.30  --     Estimated Creatinine Clearance: 64.2 ml/min (by C-G formula based on Cr of 1.4).  Assessment: Patient is a 63 y.o on heparin for r/o PE d/t elevated D-dimer.  V/Q scan now back negative for PE.  Heparin level is elevated at 1.14 but RN stated that level was drawn from arm with heparin infusing.   Goal of Therapy:  Heparin level 0.3-0.7 units/ml Monitor platelets by anticoagulation protocol: Yes   Plan:  1) continue heparin rate at 1600 units/hr 2) consider d/c heparin as V/Q scan is negative 3) will plan on getting repeat heparin level if to cont with therapy  Samai Corea P 10/16/2013,12:56 PM

## 2013-10-16 NOTE — Progress Notes (Signed)
CT contacted RN asking for pre-med order for contrast CT scan and also stated that pt "only needs to be NPO 4 hours before scan". MD contacted and updated.

## 2013-10-17 DIAGNOSIS — R072 Precordial pain: Secondary | ICD-10-CM

## 2013-10-17 LAB — GLUCOSE, CAPILLARY
GLUCOSE-CAPILLARY: 101 mg/dL — AB (ref 70–99)
Glucose-Capillary: 115 mg/dL — ABNORMAL HIGH (ref 70–99)

## 2013-10-17 MED ORDER — PANTOPRAZOLE SODIUM 40 MG PO TBEC: 40.0000 mg | DELAYED_RELEASE_TABLET | Freq: Every day | ORAL | Status: DC

## 2013-10-17 MED ORDER — DOXYCYCLINE HYCLATE 50 MG PO CAPS: 50.0000 mg | ORAL_CAPSULE | Freq: Two times a day (BID) | ORAL | Status: DC

## 2013-10-17 MED ORDER — IBUPROFEN 400 MG PO TABS: 400.0000 mg | ORAL_TABLET | Freq: Three times a day (TID) | ORAL | Status: DC | PRN

## 2013-10-17 NOTE — Care Management Note (Signed)
    Page 1 of 1   10/17/2013     2:19:43 PM CARE MANAGEMENT NOTE 10/17/2013  Patient:  ROLLIE, HYNEK   Account Number:  [id number]  Date Initiated:  10/16/2013  Documentation initiated by:  Tomi Bamberger  Subjective/Objective Assessment:   admit- lives alone.     Action/Plan:   Anticipated DC Date:  10/17/2013   Anticipated DC Plan:  [place]  CM consult      Choice offered to / List presented to:             Status of service:  In process, will continue to follow Medicare Important Message given?  YES (If response is "NO", the following Medicare IM given date fields will be blank) Date Medicare IM given:  10/16/2013 Medicare IM given by:  Tomi Bamberger Date Additional Medicare IM given:   Additional Medicare IM given by:    Discharge Disposition:  HOME/SELF CARE  Per UR Regulation:  Reviewed for med. necessity/level of care/duration of stay  If discussed at [place] of Stay Meetings, dates discussed:    Comments:

## 2013-10-17 NOTE — Discharge Summary (Signed)
Physician Discharge Summary  Aaron Snyder [facility]:[id number] DOB: 02/12/1950 DOA: 10/14/2013  PCP: No PCP Per Patient  Admit date: 10/14/2013 Discharge date: 10/17/2013  Time spent: 24 minutes  Recommendations for Outpatient Follow-up:  1. comlete  Doxycycline to complete 6 days Abx fr PNA 2. Needs CXR 2 v 1 month denote clearing of infiltrate 3. bmet and cbc 1 week as on Anti-htn agents and has mild CKD 4.   Discharge Diagnoses:  Active Problems:   HTN (hypertension)   Abnormal computed tomography of duodenum   Abdominal pain   Consolidation lung   Pleural effusion, left   Pleuritic chest pain   LUQ pain   Discharge Condition: good  Diet recommendation: heart healthy  Filed Weights   10/14/13 2205  Weight: 100.1 kg (220 lb 10.9 oz)    History of present illness:  63 y/o ? h/o UA s/p cath non-obs 11/2010, PPM 12/2002 for pauses/syncope, Htn, chronic dysphagia admitted 9/19 c LUQ pain and discomfort on taking deep breaths. No n/v/or fever chills or sputum. Doesn't endorse GI symtpoms of diarrea. not like his CP previously when he had angina.  See below.  1. LUQ pain-2/2 to CAP and Pleurisy Start CAP rx levaquin IV-->changed to PO doxy on 9/22. Rpt CXr shows only atelectasis.continue 6 days po abx V/Q scan neg. D/c heparin Gtt-cautious use Ibuprofen in setting mild aki.  Needs rpt 2 view cxr 1 mo to ensure no findings 2. UA s/p non obstructive cath 2012-Cycle enzymes. Unlikely anginal equiv as dissimilar to his original cardiac pain and cardiac enzymes neg 3. Htn-continue Irbesartan 300 qd, Atenolol 25 bid, amlodipine 10 daily. Chlorthalidone on hold.  rechekc labs in OP setting 4. Bradycardia dn pauses s/p PPM-stable 5. Acute on chronic Stage 2 CKD-was on IVF.  Will need monitoring as an OP   Consultants:  GI  Pulm [called by gi] Procedures:  Ct  CXR Antibiotics:  levaquin 9/19-9/22 Doxy 9/22-9/25  Discharge Exam: Filed Vitals:   10/17/13 0608  BP: 120/70   Pulse:   Temp: 98.2 F (36.8 C)  Resp: 20    General: alert pleasant oriented Cardiovascular: s1 s 2no m/r/g Respiratory: clear no added  Discharge Instructions You were cared for by a hospitalist during your hospital stay. If you have any questions about your discharge medications or the care you received while you were in the hospital after you are discharged, you can call the unit and asked to speak with the hospitalist on call if the hospitalist that took care of you is not available. Once you are discharged, your primary care physician will handle any further medical issues. Please note that NO REFILLS for any discharge medications will be authorized once you are discharged, as it is imperative that you return to your primary care physician (or establish a relationship with a primary care physician if you do not have one) for your aftercare needs so that they can reassess your need for medications and monitor your lab values.  Discharge Instructions   Diet - low sodium heart healthy    Complete by:  As directed      Discharge instructions    Complete by:  As directed   We think your chest pain was 2/2 to a hidden pneumonia complete 3 more days PO doxycycline-caution going into the sun-might cause some mild diarrhea Continue Tylenol for pain and ibuprofen if more severe Note changes to your bp meds     Increase activity slowly    Complete by:  As directed           Current Discharge Medication List    START taking these medications   Details  doxycycline (VIBRAMYCIN) 50 MG capsule Take 1 capsule (50 mg total) by mouth 2 (two) times daily. Qty: 6 capsule, Refills: 0    ibuprofen (ADVIL,MOTRIN) 400 MG tablet Take 1 tablet (400 mg total) by mouth every 8 (eight) hours as needed for moderate pain. Qty: 30 tablet, Refills: 0    pantoprazole (PROTONIX) 40 MG tablet Take 1 tablet (40 mg total) by mouth daily. Qty: 30 tablet, Refills: 0      CONTINUE these medications which  have NOT CHANGED   Details  acetaminophen (TYLENOL) 325 MG tablet Take 1-2 tablets (325-650 mg total) by mouth every 4 (four) hours as needed. Qty: 2 tablet    amLODipine (NORVASC) 10 MG tablet Take 10 mg by mouth daily.    atenolol (TENORMIN) 25 MG tablet Take 25 mg by mouth 2 (two) times daily.     BENICAR 40 MG tablet Take 40 mg by mouth daily.    diazepam (VALIUM) 5 MG tablet Take 5 mg by mouth daily as needed for anxiety.     levocetirizine (XYZAL) 5 MG tablet Take 5 mg by mouth every evening.    lidocaine (LIDODERM) 5 % Place 1 patch onto the skin daily as needed (Depends on how bad back pain is.). Remove & Discard patch within 12 hours or as directed by MD    Multiple Vitamins-Minerals (MULTIVITAMIN WITH MINERALS) tablet Take 1 tablet by mouth daily.      STOP taking these medications     chlorthalidone (HYGROTON) 25 MG tablet        Allergies  Allergen Reactions  . Apple Anaphylaxis  . Carrot [Daucus Carota] Anaphylaxis  . Iodinated Diagnostic Agents Shortness Of Breath and Swelling    Can have contrast as long as he has a 13 prep  . Iohexol Shortness Of Breath and Swelling    Pt requires 13hr premeds.  . Oat Anaphylaxis  . Soy Allergy Anaphylaxis  . Wheat Anaphylaxis  . Almond Meal Itching  . Cow's Milk [Lac Bovis]     Hives, and upset stomach    Follow-up Information   Follow up with No PCP Per Patient.   Specialty:  General Practice       The results of significant diagnostics from this hospitalization (including imaging, microbiology, ancillary and laboratory) are listed below for reference.    Significant Diagnostic Studies: Ct Abdomen Pelvis Wo Contrast  10/14/2013   CLINICAL DATA:  Severe left upper quadrant pain.  EXAM: CT ABDOMEN AND PELVIS WITHOUT CONTRAST  TECHNIQUE: Multidetector CT imaging of the abdomen and pelvis was performed following the standard protocol without IV contrast.  COMPARISON:  CT 12/18/2007.  FINDINGS: Visualization of the  lower thorax demonstrates heterogeneous opacities within the left lower lobe with a small left lower lobe pleural effusion. Additionally there are linear and ground-glass opacities within the right lower lobe and lingula. Normal heart size.  Lack of intravenous contrast material limits evaluation of the solid organ parenchyma.  Non contrast-enhanced liver and gallbladder are unremarkable. Spleen is unremarkable. Mild fatty atrophy of the pancreas. Normal bilateral adrenal glands. Kidneys are symmetric in size. Stable perinephric fat stranding. Unchanged 5 mm nonobstructing stone inferior pole right kidney. No ureterolithiasis. No hydronephrosis.  Normal caliber abdominal aorta. The urinary bladder is unremarkable. Prostate unremarkable.  No evidence for bowel obstruction. No free fluid or free intraperitoneal  air. There is suggestion of wall thickening of the second and third portions of the duodenum.  Multilevel degenerative change of the lumbar spine. No aggressive or acute appearing osseous lesions.  IMPRESSION: Mild wall thickening of second and third portion of the duodenum which may be secondary to duodenitis. Fluid-filled stomach.  Consolidative opacities within left lower lobe with small left pleural effusion. This may represent an infectious process in the appropriate clinical setting.   Electronically Signed   By: Lovey Newcomer M.D.   On: 10/14/2013 19:16   Dg Chest 2 View  10/16/2013   CLINICAL DATA:  Pleural effusion  EXAM: CHEST  2 VIEW  COMPARISON:  CT 10/14/2013  FINDINGS: Right-sided pacemaker overlies normal cardiac silhouette. Small left effusion is unchanged. There is bibasilar atelectasis also unchanged from comparison abdominal CT. No pulmonary edema. No pneumothorax.  IMPRESSION: 1. No interval change. 2. Small left effusion and bibasilar atelectasis.   Electronically Signed   By: Suzy Bouchard M.D.   On: 10/16/2013 12:06   Nm Pulmonary Perf And Vent  10/16/2013   CLINICAL DATA:  Chest  pain, short of breath, concern for pulmonary embolism  EXAM: NUCLEAR MEDICINE VENTILATION - PERFUSION LUNG SCAN  TECHNIQUE: Ventilation images were obtained in multiple projections using inhaled aerosol technetium 99 M DTPA. Perfusion images were obtained in multiple projections after intravenous injection of Tc-45m MAA.  RADIOPHARMACEUTICALS:  Forty mCi Tc-105m DTPA aerosol and 6.0 mCi Tc-58m MAA  COMPARISON:  V/Q scan 12/15/2011, radiograph 10/16/2013  FINDINGS: Ventilation: No focal ventilation defect.  Perfusion: No wedge shaped peripheral perfusion defects to suggest acute pulmonary embolism.  IMPRESSION: No evidence of pulmonary embolism.   Electronically Signed   By: Suzy Bouchard M.D.   On: 10/16/2013 11:39   Dg Chest Port 1 View  10/14/2013   CLINICAL DATA:  Left upper quadrant pain.  EXAM: PORTABLE CHEST - 1 VIEW  COMPARISON:  12/14/2011  FINDINGS: Lung volumes are low. Allowing for this, the lungs are clear. Heart, mediastinum hila are unremarkable. Right anterior chest wall sequential pacemaker has its leads superimposed over the right atrium and right ventricle, stable.  No pleural effusion or pneumothorax.  Bony thorax is grossly intact.  IMPRESSION: No acute cardiopulmonary disease.   Electronically Signed   By: Lajean Manes M.D.   On: 10/14/2013 17:02    Microbiology: No results found for this or any previous visit (from the past 240 hour(s)).   Labs: Basic Metabolic Panel:  Recent Labs Lab 10/14/13 1720 10/15/13 0640 10/16/13 1353  NA 136* 141 142  K 3.6* 3.8 3.8  CL 96 101 100  CO2 28 28 28   GLUCOSE 113* 107* 98  BUN 20 16 16   CREATININE 1.39* 1.40* 1.43*  CALCIUM 9.0 8.7 9.5   Liver Function Tests:  Recent Labs Lab 10/14/13 1720  AST 21  ALT 16  ALKPHOS 80  BILITOT 0.5  PROT 6.7  ALBUMIN 3.3*    Recent Labs Lab 10/14/13 1720  LIPASE 24   No results found for this basename: AMMONIA,  in the last 168 hours CBC:  Recent Labs Lab 10/14/13 1720  10/15/13 0640 10/16/13 0945  WBC 8.5 8.3 7.9  NEUTROABS 6.1  --   --   HGB 13.2 12.5* 12.5*  HCT 38.1* 36.5* 39.6  MCV 86.6 88.2 87.6  PLT 178 168 201   Cardiac Enzymes:  Recent Labs Lab 10/14/13 0011 10/14/13 1720 10/15/13 0640 10/15/13 1118  TROPONINI <0.30 <0.30 <0.30 <0.30   BNP: BNP (  last 3 results) No results found for this basename: PROBNP,  in the last 8760 hours CBG:  Recent Labs Lab 10/16/13 0023 10/16/13 0602 10/16/13 1152 10/17/13 0026 10/17/13 0606  GLUCAP 122* 93 93 115* 101*       Signed:  Nita Sells  [facility] 10/17/2013, 11:19 AM

## 2013-10-17 NOTE — Progress Notes (Signed)
NURSING PROGRESS NOTE  Aaron Snyder [id number] Discharge Data: 10/17/2013 12:05 PM Attending Provider: Nita Sells, MD PCP:No PCP Per Patient     [name] to be D/C'd Home per MD order.  Discussed with the patient the After Visit Summary and all questions fully answered. All IV's discontinued with no bleeding noted. All belongings returned to patient for patient to take home.   Last Vital Signs:  Blood pressure 120/70, pulse 90, temperature 98.2 F (36.8 C), temperature source Oral, resp. rate 20, height 5\' 11"  (1.803 m), weight 100.1 kg (220 lb 10.9 oz), SpO2 98.00%.  Discharge Medication List   Medication List    STOP taking these medications       chlorthalidone 25 MG tablet  Commonly known as:  HYGROTON      TAKE these medications       acetaminophen 325 MG tablet  Commonly known as:  TYLENOL  Take 1-2 tablets (325-650 mg total) by mouth every 4 (four) hours as needed.     amLODipine 10 MG tablet  Commonly known as:  NORVASC  Take 10 mg by mouth daily.     atenolol 25 MG tablet  Commonly known as:  TENORMIN  Take 25 mg by mouth 2 (two) times daily.     BENICAR 40 MG tablet  Generic drug:  olmesartan  Take 40 mg by mouth daily.     diazepam 5 MG tablet  Commonly known as:  VALIUM  Take 5 mg by mouth daily as needed for anxiety.     doxycycline 50 MG capsule  Commonly known as:  VIBRAMYCIN  Take 1 capsule (50 mg total) by mouth 2 (two) times daily.     ibuprofen 400 MG tablet  Commonly known as:  ADVIL,MOTRIN  Take 1 tablet (400 mg total) by mouth every 8 (eight) hours as needed for moderate pain.     levocetirizine 5 MG tablet  Commonly known as:  XYZAL  Take 5 mg by mouth every evening.     lidocaine 5 %  Commonly known as:  LIDODERM  Place 1 patch onto the skin daily as needed (Depends on how bad back pain is.). Remove & Discard patch within 12 hours or as directed by MD     multivitamin with minerals tablet  Take 1 tablet by  mouth daily.     pantoprazole 40 MG tablet  Commonly known as:  PROTONIX  Take 1 tablet (40 mg total) by mouth daily.

## 2013-10-17 NOTE — Progress Notes (Signed)
ANTIBIOTIC CONSULT NOTE - FOLLOW UP  Pharmacy Consult for levaquin Indication: pneumonia  Allergies  Allergen Reactions  . Apple Anaphylaxis  . Carrot [Daucus Carota] Anaphylaxis  . Iodinated Diagnostic Agents Shortness Of Breath and Swelling    Can have contrast as long as he has a 13 prep  . Iohexol Shortness Of Breath and Swelling    Pt requires 13hr premeds.  . Oat Anaphylaxis  . Soy Allergy Anaphylaxis  . Wheat Anaphylaxis  . Almond Meal Itching  . Cow's Milk [Lac Bovis]     Hives, and upset stomach     Patient Measurements: Height: 5\' 11"  (180.3 cm) Weight: 220 lb 10.9 oz (100.1 kg) IBW/kg (Calculated) : 75.3   Vital Signs: Temp: 98.2 F (36.8 C) (09/22 9379) Temp src: Oral (09/22 0608) BP: 120/70 mmHg (09/22 0608) Pulse Rate: 90 (09/21 2251) Intake/Output from previous day: 09/21 0701 - 09/22 0700 In: 480 [P.O.:480] Out: 300 [Urine:300] Intake/Output from this shift:    Labs:  Recent Labs  10/14/13 1720 10/15/13 0640 10/16/13 0945 10/16/13 1353  WBC 8.5 8.3 7.9  --   HGB 13.2 12.5* 12.5*  --   PLT 178 168 201  --   CREATININE 1.39* 1.40*  --  1.43*   Estimated Creatinine Clearance: 62.9 ml/min (by C-G formula based on Cr of 1.43). No results found for this basename: VANCOTROUGH, VANCOPEAK, VANCORANDOM, GENTTROUGH, GENTPEAK, GENTRANDOM, TOBRATROUGH, TOBRAPEAK, TOBRARND, AMIKACINPEAK, AMIKACINTROU, AMIKACIN,  in the last 72 hours   Microbiology: No results found for this or any previous visit (from the past 720 hour(s)).  Anti-infectives   Start     Dose/Rate Route Frequency Ordered Stop   10/16/13 2200  levofloxacin (LEVAQUIN) tablet 750 mg     750 mg Oral Daily at bedtime 10/16/13 1418     10/16/13 1430  levofloxacin (LEVAQUIN) tablet 500 mg  Status:  Discontinued     500 mg Oral Daily 10/16/13 1415 10/16/13 1417   10/14/13 2230  levofloxacin (LEVAQUIN) IVPB 750 mg  Status:  Discontinued     750 mg 100 mL/hr over 90 Minutes Intravenous Every  24 hours 10/14/13 2214 10/16/13 1415      Assessment: Patient is a 63 y.o M on  levaquin for PNA.  No cultures.  Afeb, wbc wnl. Scr continues to trend up to 1.43 today (crcl~63)  9/19 LVQ Rx>>   Plan:  1) continue levaquin 750mg  q24h 2) monitor renal function and adjust dose as needed  Ulis Kaps P 10/17/2013,10:30 AM

## 2013-11-10 ENCOUNTER — Other Ambulatory Visit: Payer: Self-pay

## 2013-11-16 ENCOUNTER — Other Ambulatory Visit: Payer: Self-pay | Admitting: Nurse Practitioner

## 2013-11-16 ENCOUNTER — Ambulatory Visit
Admission: RE | Admit: 2013-11-16 | Discharge: 2013-11-16 | Disposition: A | Discharge status: Home / Self Care | Payer: Medicare HMO | Source: Ambulatory Visit | Attending: Nurse Practitioner | Admitting: Nurse Practitioner

## 2013-11-16 DIAGNOSIS — Z09 Encounter for follow-up examination after completed treatment for conditions other than malignant neoplasm: Secondary | ICD-10-CM

## 2013-11-21 ENCOUNTER — Encounter: Payer: Self-pay | Admitting: Cardiovascular Disease

## 2013-11-21 ENCOUNTER — Ambulatory Visit (INDEPENDENT_AMBULATORY_CARE_PROVIDER_SITE_OTHER): Payer: Medicare HMO | Admitting: Cardiovascular Disease

## 2013-11-21 VITALS — BP 124/90 | HR 88 | Ht 71.0 in | Wt 221.8 lb

## 2013-11-21 DIAGNOSIS — I495 Sick sinus syndrome: Secondary | ICD-10-CM

## 2013-11-21 DIAGNOSIS — R079 Chest pain, unspecified: Secondary | ICD-10-CM

## 2013-11-21 DIAGNOSIS — R0600 Dyspnea, unspecified: Secondary | ICD-10-CM

## 2013-11-21 DIAGNOSIS — I2510 Atherosclerotic heart disease of native coronary artery without angina pectoris: Secondary | ICD-10-CM

## 2013-11-21 DIAGNOSIS — Z950 Presence of cardiac pacemaker: Secondary | ICD-10-CM

## 2013-11-21 DIAGNOSIS — R0789 Other chest pain: Secondary | ICD-10-CM

## 2013-11-21 DIAGNOSIS — I10 Essential (primary) hypertension: Secondary | ICD-10-CM

## 2013-11-21 LAB — MDC_IDC_ENUM_SESS_TYPE_INCLINIC
Battery Impedance: 159 Ohm
Battery Remaining Longevity: 140 mo
Brady Statistic AP VP Percent: 0 %
Brady Statistic AP VS Percent: 96 %
Brady Statistic AS VS Percent: 4 %
Date Time Interrogation Session: 20151027105541
Lead Channel Impedance Value: 456 Ohm
Lead Channel Impedance Value: 622 Ohm
Lead Channel Pacing Threshold Amplitude: 0.5 V
Lead Channel Pacing Threshold Pulse Width: 0.4 ms
Lead Channel Pacing Threshold Pulse Width: 0.4 ms
Lead Channel Sensing Intrinsic Amplitude: 4 mV
Lead Channel Sensing Intrinsic Amplitude: 8 mV
Lead Channel Setting Pacing Amplitude: 1.5 V
Lead Channel Setting Pacing Amplitude: 2 V
Lead Channel Setting Sensing Sensitivity: 2.8 mV
MDC IDC MSMT BATTERY VOLTAGE: 2.78 V
MDC IDC MSMT LEADCHNL RV PACING THRESHOLD AMPLITUDE: 1 V
MDC IDC SET LEADCHNL RV PACING PULSEWIDTH: 0.4 ms
MDC IDC STAT BRADY AS VP PERCENT: 0 %

## 2013-11-21 NOTE — Patient Instructions (Signed)
Your physician has requested that you have an echocardiogram. Echocardiography is a painless test that uses sound waves to create images of your heart. It provides your doctor with information about the size and shape of your heart and how well your heart's chambers and valves are working. This procedure takes approximately one hour. There are no restrictions for this procedure.  Remote monitoring is used to monitor your Pacemaker or ICD from home. This monitoring reduces the number of office visits required to check your device to one time per year. It allows [place] to monitor the functioning of your device to ensure it is working properly. You are scheduled for a device check from home on February 22, 2014. You may send your transmission at any time that day. If you have a wireless device, the transmission will be sent automatically. After your physician reviews your transmission, you will receive a postcard with your next transmission date.  Dr. Sallyanne Kuster recommends that you schedule a follow-up appointment in: One year.

## 2013-11-24 ENCOUNTER — Encounter (HOSPITAL_COMMUNITY): Payer: Self-pay | Admitting: *Deleted

## 2013-11-24 ENCOUNTER — Encounter: Payer: Self-pay | Admitting: Cardiovascular Disease

## 2013-11-24 DIAGNOSIS — R0789 Other chest pain: Secondary | ICD-10-CM

## 2013-11-24 HISTORY — DX: Other chest pain: R07.89

## 2013-11-24 NOTE — Progress Notes (Signed)
Patient ID: Aaron Snyder, male   DOB: [birth year], 63 y.o.   MRN: [id number]      Reason for office visit Pacemaker check, HTN   Aaron Snyder continues to exercise virtually daily, for 30-45 minutes at a time. His preferred exercise is the stationary bike and in general prefers cardio exercises to weight lifting. His son is an athlete and has been encouraging him to lift more weights.  after the last sensory or adjustment his heart rate histogram distribution is more favorable.   He has intermittent sharp shooting chest pain that occurs when he goes to the gym, but is not associated with the intensity of activity. It sounds musculoskeletal.  Pacemaker interrogation shows 96% atrial pacing and less than 0.3% ventricular pacing. There've been no episodes of tachyarrhythmia recorded. Lead parameters and generator parameters are excellent. Current battery longevity is estimated to be 11.5 years.  He initially received a pacemaker in 2005 after several episodes of syncope and documented sinus pauses on a loop recorder. At generator change out was performed for ERI in 2014 . He has treated systemic hypertension and a history of head injury and cervical spine problems. Chest pain in 2005 and in 2013 led to coronary angiography which showed minor plaque without any meaningful stenoses. Note that in 2005, despite severely elevated BP, there was no evidence of renal artery stenosis. In addition to hypertension he has gastroesophageal reflux disease and earlier this year had pneumonia. He is still recovering from this.   he had a normal chest x-ray on October 22 and a normal VQ scan on September 21.     Allergies  Allergen Reactions  . Apple Anaphylaxis  . Carrot [Daucus Carota] Anaphylaxis  . Iodinated Diagnostic Agents Shortness Of Breath and Swelling    Can have contrast as long as he has a 13 prep  . Iohexol Shortness Of Breath and Swelling    Pt requires 13hr premeds.  . Oat Anaphylaxis  . Soy  Allergy Anaphylaxis  . Wheat Anaphylaxis  . Almond Meal Itching  . Cow's Milk [Lac Bovis]     Hives, and upset stomach     Current Outpatient Prescriptions  Medication Sig Dispense Refill  . acetaminophen (TYLENOL) 325 MG tablet Take 1-2 tablets (325-650 mg total) by mouth every 4 (four) hours as needed.  2 tablet    . amLODipine (NORVASC) 10 MG tablet Take 10 mg by mouth daily.      [name] aspirin 81 MG tablet Take 81 mg by mouth daily.      [name] atenolol (TENORMIN) 25 MG tablet Take 25 mg by mouth 2 (two) times daily.       [name] BENICAR 40 MG tablet Take 40 mg by mouth daily.      . chlorthalidone (HYGROTON) 25 MG tablet Take 1 tablet by mouth daily.      . diazepam (VALIUM) 5 MG tablet Take 5 mg by mouth daily as needed for anxiety.       [name] ibuprofen (ADVIL,MOTRIN) 400 MG tablet Take 1 tablet (400 mg total) by mouth every 8 (eight) hours as needed for moderate pain.  30 tablet  0  . levocetirizine (XYZAL) 5 MG tablet Take 5 mg by mouth every evening.      . lidocaine (LIDODERM) 5 % Place 1 patch onto the skin daily as needed (Depends on how bad back pain is.). Remove & Discard patch within 12 hours or as directed by MD      . Multiple  Vitamins-Minerals (MULTIVITAMIN WITH MINERALS) tablet Take 1 tablet by mouth daily.      . pantoprazole (PROTONIX) 40 MG tablet Take 1 tablet (40 mg total) by mouth daily.  30 tablet  0  . [DISCONTINUED] benazepril (LOTENSIN) 20 MG tablet Take 20 mg by mouth daily.       No current facility-administered medications for this visit.    Past Medical History  Diagnosis Date  . Coronary artery disease   . Hypertension   . Head injury   . Dysphagia   . Presence of permanent cardiac pacemaker 02/01/2003    Medtronic  . Pacemaker generator end of life 12/18/2011    Medtronic  . Syncope     Past Surgical History  Procedure Laterality Date  . Total shoulder arthroplasty    . Pacemaker insertion  2005    Medtronic  . Loop recorder implant/explant  2005  .  Pacemaker generator change  12/18/2011    Medtronic  . Nm myocar perf wall motion  01/2011    Negative  . Esophagogastroduodenoscopy      Aaron Snyder    No family history on file.  History   Social History  . Marital Status: Single    Spouse Name: N/A    Number of Children: N/A  . Years of Education: N/A   Occupational History  . Disabled    Social History Main Topics  . Smoking status: Former Smoker -- 1.00 packs/day for 15 years    Types: Cigarettes    Quit date: 03/15/1996  . Smokeless tobacco: Never Used  . Alcohol Use: No  . Drug Use: No  . Sexual Activity: Not on file   Other Topics Concern  . Not on file   Social History Narrative  . No narrative on file    Review of systems intermittent brief sharp chest pain  The patient specifically denies dyspnea at rest or with exertion, orthopnea, paroxysmal nocturnal dyspnea, syncope, palpitations, focal neurological deficits, intermittent claudication, lower extremity edema, unexplained weight gain, cough, hemoptysis or wheezing.  The patient also denies abdominal pain, nausea, vomiting, dysphagia, diarrhea, constipation, polyuria, polydipsia, dysuria, hematuria, frequency, urgency, abnormal bleeding or bruising, fever, chills, unexpected weight changes, mood swings, change in skin or hair texture, change in voice quality, auditory or visual problems, allergic reactions or rashes, new musculoskeletal complaints other than usual "aches and pains".   PHYSICAL EXAM BP 124/90  Pulse 88  Ht 5\' 11"  (1.803 m)  Wt 221 lb 12.8 oz (100.608 kg)  BMI 30.95 kg/m2 General: Alert, oriented x3, no distress  Head: no evidence of trauma, PERRL, EOMI, no exophtalmos or lid lag, no myxedema, no xanthelasma; normal ears, nose and oropharynx  Neck: normal jugular venous pulsations and no hepatojugular reflux; brisk carotid pulses without delay and no carotid bruits  Chest: clear to auscultation, no signs of consolidation by percussion or  palpation, normal fremitus, symmetrical and full respiratory excursions; healthy appearance of the pacemaker site  Cardiovascular: normal position and quality of the apical impulse, regular rhythm, normal first and second heart sounds, no murmurs, rubs or gallops  Abdomen: no tenderness or distention, no masses by palpation, no abnormal pulsatility or arterial bruits, normal bowel sounds, no hepatosplenomegaly  Extremities: no clubbing, cyanosis or edema; 2+ radial, ulnar and brachial pulses bilaterally; 2+ right femoral, posterior tibial and dorsalis pedis pulses; 2+ left femoral, posterior tibial and dorsalis pedis pulses; no subclavian or femoral bruits  Neurological: grossly nonfocal   EKG: Atrial paced, ventricular sensed, otherwise normal  BMET    Component Value Date/Time   NA 142 10/16/2013 1353   K 3.8 10/16/2013 1353   CL 100 10/16/2013 1353   CO2 28 10/16/2013 1353   GLUCOSE 98 10/16/2013 1353   BUN 16 10/16/2013 1353   CREATININE 1.43* 10/16/2013 1353   CALCIUM 9.5 10/16/2013 1353   GFRNONAA 50* 10/16/2013 1353   GFRAA 18* 10/16/2013 1353     ASSESSMENT AND PLAN  Pacemaker - dual chamber Medtronic 2004, new generator 2013  Comprehensive device check shows normal function. Atrial and ventricular lead parameters are excellent. There is 96% atrial pacing and virtually no ventricular pacing. Expected generator longevity greater than 11 years.  The heart rate distribution histogram is better after the sounds are setting changes performed at his last visit. Coronary artery disease nonobstructive, Jan 2005, Myoview low risk Jan 2013  He does not have any symptoms to suggest active coronary insufficiency. In fact he is physically very active without restriction from chest discomfort. Suspect noncardiac, likely musculoskeletal  cause of occasional chest discomfort. Check an echocardiogram to look for evidence of pericardial disease and also to reevaluate his depressed left ventricular  systolic function  HTN (hypertension)  His blood pressure was well-controlled today   Orders Placed This Encounter  Procedures  . Implantable device check  . EKG 12-Lead  . 2D Echocardiogram without contrast   Meds ordered this encounter  Medications  . chlorthalidone (HYGROTON) 25 MG tablet    Sig: Take 1 tablet by mouth daily.  [name] aspirin 81 MG tablet    Sig: Take 81 mg by mouth daily.    Holli Humbles, MD, [facility] [phone] office [phone] pager  16

## 2013-12-05 ENCOUNTER — Ambulatory Visit (HOSPITAL_COMMUNITY)
Admission: RE | Admit: 2013-12-05 | Discharge: 2013-12-05 | Disposition: A | Discharge status: Home / Self Care | Payer: Medicare HMO | Source: Ambulatory Visit | Attending: Cardiovascular Disease | Admitting: Cardiovascular Disease

## 2013-12-05 DIAGNOSIS — R609 Edema, unspecified: Secondary | ICD-10-CM | POA: Insufficient documentation

## 2013-12-05 DIAGNOSIS — Z87891 Personal history of nicotine dependence: Secondary | ICD-10-CM | POA: Diagnosis not present

## 2013-12-05 DIAGNOSIS — R0789 Other chest pain: Secondary | ICD-10-CM | POA: Diagnosis not present

## 2013-12-05 DIAGNOSIS — R0600 Dyspnea, unspecified: Secondary | ICD-10-CM

## 2013-12-05 DIAGNOSIS — I517 Cardiomegaly: Secondary | ICD-10-CM

## 2013-12-05 DIAGNOSIS — I10 Essential (primary) hypertension: Secondary | ICD-10-CM | POA: Insufficient documentation

## 2013-12-05 NOTE — Progress Notes (Signed)
2D Echocardiogram Complete.  12/05/2013   Aaron Snyder [place], [name]

## 2014-01-04 ENCOUNTER — Encounter (HOSPITAL_COMMUNITY): Payer: Self-pay | Admitting: Cardiology

## 2014-01-11 ENCOUNTER — Telehealth: Payer: Self-pay | Admitting: Cardiovascular Disease

## 2014-01-11 ENCOUNTER — Other Ambulatory Visit: Payer: Self-pay | Admitting: Family

## 2014-01-11 DIAGNOSIS — I7300 Raynaud's syndrome without gangrene: Secondary | ICD-10-CM

## 2014-01-11 NOTE — Telephone Encounter (Signed)
Spoke to patient  Results of echo given. Verbalized understanding. Informed patient it is okay to take synthroid with other medication. RN updated medication list  25 mcg synthroid.

## 2014-01-11 NOTE — Telephone Encounter (Signed)
Pt never received his echo results he had in November.Dr Toy Care prescribed Synthroid,is this all right for him to take?

## 2014-01-23 ENCOUNTER — Ambulatory Visit
Admission: RE | Admit: 2014-01-23 | Discharge: 2014-01-23 | Disposition: A | Discharge status: Home / Self Care | Payer: Medicare HMO | Source: Ambulatory Visit | Attending: Family | Admitting: Family

## 2014-01-23 DIAGNOSIS — I7300 Raynaud's syndrome without gangrene: Secondary | ICD-10-CM

## 2014-02-22 ENCOUNTER — Telehealth: Payer: Self-pay | Admitting: Cardiology

## 2014-02-22 ENCOUNTER — Telehealth: Payer: Self-pay | Admitting: Cardiovascular Disease

## 2014-02-22 ENCOUNTER — Encounter: Payer: Medicare HMO | Admitting: *Deleted

## 2014-02-22 NOTE — Telephone Encounter (Signed)
Appt made for pt to F/U w/ device clinic on 2/3. Pt is aware that the [facility] devices have been on back order and may not get to him for 6-8 weeks.

## 2014-02-22 NOTE — Telephone Encounter (Signed)
Aaron Snyder is calling because he was to send a transmission today , but had to call [employer] and they told him he needs a new device . He needs a wireless device . Wants to know what is he to do in the meantime , does he need to R/S  Or what . Please call   Thanks

## 2014-02-22 NOTE — Telephone Encounter (Signed)
LMOVM reminding pt to send remote transmission.   

## 2014-02-23 ENCOUNTER — Encounter: Payer: Self-pay | Admitting: Cardiology

## 2014-02-28 ENCOUNTER — Ambulatory Visit (INDEPENDENT_AMBULATORY_CARE_PROVIDER_SITE_OTHER): Payer: Medicare HMO | Admitting: *Deleted

## 2014-02-28 DIAGNOSIS — I495 Sick sinus syndrome: Secondary | ICD-10-CM | POA: Diagnosis not present

## 2014-02-28 LAB — MDC_IDC_ENUM_SESS_TYPE_INCLINIC
Battery Impedance: 159 Ohm
Battery Remaining Longevity: 139 mo
Battery Voltage: 2.78 V
Brady Statistic AP VP Percent: 0 %
Brady Statistic AP VS Percent: 90 %
Brady Statistic AS VP Percent: 0 %
Brady Statistic AS VS Percent: 10 %
Date Time Interrogation Session: 20160203095722
Lead Channel Impedance Value: 414 Ohm
Lead Channel Impedance Value: 587 Ohm
Lead Channel Pacing Threshold Amplitude: 0.5 V
Lead Channel Pacing Threshold Amplitude: 0.75 V
Lead Channel Pacing Threshold Pulse Width: 0.4 ms
Lead Channel Pacing Threshold Pulse Width: 0.4 ms
Lead Channel Sensing Intrinsic Amplitude: 11.2 mV
Lead Channel Sensing Intrinsic Amplitude: 4 mV
Lead Channel Setting Pacing Amplitude: 1.5 V
Lead Channel Setting Pacing Amplitude: 2 V
Lead Channel Setting Pacing Pulse Width: 0.4 ms
Lead Channel Setting Sensing Sensitivity: 2.8 mV

## 2014-02-28 NOTE — Progress Notes (Signed)
Pacemaker check in clinic. Normal device function. Thresholds, sensing, impedances consistent with previous measurements. Device programmed to maximize longevity. No mode switch or high ventricular rates noted. Device programmed at appropriate safety margins. Histogram distribution appropriate for patient activity level. Device programmed to optimize intrinsic conduction. Estimated longevity 11.5 years. Patient enrolled in remote follow-up/TTM's with [employer]. Plan to follow every 3 months remotely and see annually in office. Patient education completed.  [facility] 05/30/14.

## 2014-03-16 ENCOUNTER — Encounter: Payer: Self-pay | Admitting: Cardiovascular Disease

## 2014-04-06 ENCOUNTER — Telehealth: Payer: Self-pay | Admitting: Cardiovascular Disease

## 2014-04-06 NOTE — Telephone Encounter (Signed)
Left message for patient that wire-x is on back order and he should receive it in a few weeks.

## 2014-04-06 NOTE — Telephone Encounter (Signed)
Can you call this pt. About his monitor , he said he suppose to get a new machine

## 2014-04-06 NOTE — Telephone Encounter (Signed)
Pt says he was here about a month and half ago. He was told he would receive a new monitor for his pacemaker,still have not received it.

## 2014-05-30 ENCOUNTER — Ambulatory Visit (INDEPENDENT_AMBULATORY_CARE_PROVIDER_SITE_OTHER): Payer: Medicare HMO | Admitting: *Deleted

## 2014-05-30 DIAGNOSIS — I495 Sick sinus syndrome: Secondary | ICD-10-CM

## 2014-05-30 NOTE — Progress Notes (Signed)
Remote pacemaker transmission.   

## 2014-06-02 LAB — CUP PACEART REMOTE DEVICE CHECK
Battery Impedance: 183 Ohm
Battery Voltage: 2.8 V
Brady Statistic AP VP Percent: 0 %
Brady Statistic AP VS Percent: 89 %
Brady Statistic AS VP Percent: 0 %
Brady Statistic AS VS Percent: 11 %
Lead Channel Impedance Value: 419 Ohm
Lead Channel Pacing Threshold Pulse Width: 0.4 ms
Lead Channel Setting Pacing Amplitude: 1.5 V
Lead Channel Setting Pacing Amplitude: 2 V
Lead Channel Setting Pacing Pulse Width: 0.4 ms
Lead Channel Setting Sensing Sensitivity: 4 mV
MDC IDC MSMT BATTERY REMAINING LONGEVITY: 133 mo
MDC IDC MSMT LEADCHNL RA PACING THRESHOLD AMPLITUDE: 0.5 V
MDC IDC MSMT LEADCHNL RV IMPEDANCE VALUE: 659 Ohm
MDC IDC MSMT LEADCHNL RV PACING THRESHOLD AMPLITUDE: 0.875 V
MDC IDC MSMT LEADCHNL RV PACING THRESHOLD PULSEWIDTH: 0.4 ms
MDC IDC MSMT LEADCHNL RV SENSING INTR AMPL: 8 mV
MDC IDC SESS DTM: 20160504115603

## 2014-06-07 ENCOUNTER — Encounter: Payer: Self-pay | Admitting: Cardiology

## 2014-06-12 ENCOUNTER — Encounter: Payer: Self-pay | Admitting: Cardiovascular Disease

## 2014-08-29 ENCOUNTER — Encounter: Payer: Medicare HMO | Admitting: *Deleted

## 2014-08-29 ENCOUNTER — Telehealth: Payer: Self-pay | Admitting: Cardiology

## 2014-08-29 NOTE — Telephone Encounter (Signed)
LMOVM reminding pt to send remote transmission.   

## 2014-08-30 ENCOUNTER — Encounter: Payer: Self-pay | Admitting: Cardiology

## 2014-09-05 ENCOUNTER — Telehealth: Payer: Self-pay | Admitting: Cardiovascular Disease

## 2014-09-05 NOTE — Telephone Encounter (Signed)
New message     Pt calling in regards to letter received about not receiving transmission Please call pt back after 3

## 2014-09-06 ENCOUNTER — Ambulatory Visit (INDEPENDENT_AMBULATORY_CARE_PROVIDER_SITE_OTHER): Payer: Medicare HMO | Admitting: *Deleted

## 2014-09-06 DIAGNOSIS — I495 Sick sinus syndrome: Secondary | ICD-10-CM | POA: Diagnosis not present

## 2014-09-06 DIAGNOSIS — Z950 Presence of cardiac pacemaker: Secondary | ICD-10-CM | POA: Diagnosis not present

## 2014-09-06 NOTE — Telephone Encounter (Signed)
Informed pt his transmission was not received. Pt will try to resend transmission tonight.

## 2014-09-07 NOTE — Progress Notes (Signed)
PPM remote received.  

## 2014-09-12 LAB — CUP PACEART REMOTE DEVICE CHECK
Battery Impedance: 183 Ohm
Battery Voltage: 2.78 V
Brady Statistic AP VP Percent: 0 %
Brady Statistic AP VS Percent: 90 %
Date Time Interrogation Session: 20160811195340
Lead Channel Impedance Value: 409 Ohm
Lead Channel Impedance Value: 711 Ohm
Lead Channel Pacing Threshold Amplitude: 0.875 V
Lead Channel Pacing Threshold Pulse Width: 0.4 ms
Lead Channel Pacing Threshold Pulse Width: 0.4 ms
Lead Channel Sensing Intrinsic Amplitude: 8 mV
Lead Channel Setting Pacing Amplitude: 1.5 V
Lead Channel Setting Pacing Amplitude: 2 V
Lead Channel Setting Sensing Sensitivity: 4 mV
MDC IDC MSMT BATTERY REMAINING LONGEVITY: 133 mo
MDC IDC MSMT LEADCHNL RA PACING THRESHOLD AMPLITUDE: 0.5 V
MDC IDC SET LEADCHNL RV PACING PULSEWIDTH: 0.4 ms
MDC IDC STAT BRADY AS VP PERCENT: 0 %
MDC IDC STAT BRADY AS VS PERCENT: 9 %

## 2014-09-26 ENCOUNTER — Encounter: Payer: Self-pay | Admitting: Cardiology

## 2014-10-02 ENCOUNTER — Encounter: Payer: Self-pay | Admitting: Cardiovascular Disease

## 2014-11-27 ENCOUNTER — Encounter: Payer: Self-pay | Admitting: *Deleted

## 2014-12-05 ENCOUNTER — Encounter: Payer: Self-pay | Admitting: Cardiovascular Disease

## 2014-12-05 ENCOUNTER — Ambulatory Visit (INDEPENDENT_AMBULATORY_CARE_PROVIDER_SITE_OTHER): Payer: Medicare HMO | Admitting: Cardiovascular Disease

## 2014-12-05 VITALS — BP 120/80 | HR 87 | Resp 16 | Ht 72.0 in | Wt 220.9 lb

## 2014-12-05 DIAGNOSIS — I10 Essential (primary) hypertension: Secondary | ICD-10-CM

## 2014-12-05 DIAGNOSIS — Z950 Presence of cardiac pacemaker: Secondary | ICD-10-CM

## 2014-12-05 DIAGNOSIS — I495 Sick sinus syndrome: Secondary | ICD-10-CM

## 2014-12-05 DIAGNOSIS — I2510 Atherosclerotic heart disease of native coronary artery without angina pectoris: Secondary | ICD-10-CM

## 2014-12-05 LAB — CUP PACEART INCLINIC DEVICE CHECK
Battery Impedance: 207 Ohm
Battery Remaining Longevity: 126 mo
Battery Voltage: 2.79 V
Brady Statistic AP VS Percent: 91.5 %
Brady Statistic AS VP Percent: 0.1 % — CL
Date Time Interrogation Session: 20161109092842
Implantable Lead Implant Date: 20131122
Implantable Lead Location: 753859
Implantable Lead Location: 753860
Implantable Lead Model: 5092
Implantable Lead Model: 5594
Lead Channel Pacing Threshold Amplitude: 0.75 V
Lead Channel Pacing Threshold Pulse Width: 0.4 ms
Lead Channel Pacing Threshold Pulse Width: 0.4 ms
Lead Channel Setting Pacing Amplitude: 2 V
Lead Channel Setting Pacing Pulse Width: 0.4 ms
Lead Channel Setting Sensing Sensitivity: 4 mV
MDC IDC LEAD IMPLANT DT: 20050106
MDC IDC MSMT LEADCHNL RA IMPEDANCE VALUE: 425 Ohm
MDC IDC MSMT LEADCHNL RA PACING THRESHOLD AMPLITUDE: 0.5 V
MDC IDC MSMT LEADCHNL RV IMPEDANCE VALUE: 674 Ohm
MDC IDC MSMT LEADCHNL RV SENSING INTR AMPL: 8 mV
MDC IDC SET LEADCHNL RA PACING AMPLITUDE: 1.5 V
MDC IDC STAT BRADY AP VP PERCENT: 0.3 %
MDC IDC STAT BRADY AS VS PERCENT: 8.2 %

## 2014-12-05 MED ORDER — PANTOPRAZOLE SODIUM 40 MG PO TBEC: 40.0000 mg | DELAYED_RELEASE_TABLET | Freq: Every day | ORAL | Status: DC

## 2014-12-05 NOTE — Patient Instructions (Addendum)
Remote monitoring is used to monitor your pacemaker from home. This monitoring reduces the number of office visits required to check your device to one time per year. It allows [place] to keep an eye on the functioning of your device to ensure it is working properly. You are scheduled for a device check from home on 03/06/15. You may send your transmission at any time that day. If you have a wireless device, the transmission will be sent automatically. After your physician reviews your transmission, you will receive a postcard with your next transmission date.  Your physician recommends that you schedule a follow-up appointment in: 6 months with Dr.Croitoru  Your physician has requested that you have en exercise stress myoview. For further information please visit [email or web address]. Please follow instruction sheet, as given.

## 2014-12-07 NOTE — Progress Notes (Signed)
Patient ID: Aaron Snyder, male   DOB: 1950-02-09, 64 y.o.   MRN: [id number]     Cardiology Office Note   Date:  12/07/2014   ID:  Aaron, Snyder 09-05-1950, MRN [id number]  PCP:  Maximino Greenland, MD  Cardiologist:   Sanda Klein, MD   Chief Complaint  Patient presents with  . Annual Exam    patient reports little chest tightness, shortness of breath with exertion - can't walk up a flight of stairs w/o getting short odf breath, no energy after exercise      History of Present Illness: Aaron Snyder is a 64 y.o. male who presents for acemaker check. His current pacemaker was a generator change performed in 2013. His device was initially implanted for symptomatic bradycardia and pauses with syncope. He has marked sinus bradycardia and over 90% atrial pacing, but virtually never requires ventricular pacing.   He has noticed a substantial reduction in his exercise ability, although he still continues to go to the gym regularly. He has noticed that he cannot walk up a flight of stairs without becoming short of breath and he feels exhausted after his usual gym routine. He has had a little bit of chest tightness but this does not appear to be a prominent complaint.   Pacemaker interrogation shows normal lead parameters and similar pacing prevalence as before : 92% atrial pacing, 0.3% ventricular pacing. His histogram is somewhat blunted.  No significant episodes of mode switch or high ventricular rates have been recorded.  Electrocardiogram shows atrial paced ventricular sensed rhythm with pre-existing nonspecific repolarization changes and no major abnormalities. No significant episodes  He initially received a pacemaker in 2005 after several episodes of syncope and documented sinus pauses on a loop recorder. At generator change out was performed for ERI in 2014 . He has treated systemic hypertension and a history of head injury and cervical spine problems. Chest pain in 2005 and  in 2013 led to coronary angiography which showed minor plaque without any meaningful stenoses. Note that in 2005, despite severely elevated BP, there was no evidence of renal artery stenosis. In addition to hypertension he has gastroesophageal reflux disease and earlier this year had pneumonia. He is still recovering from this.   Past Medical History  Diagnosis Date  . Coronary artery disease   . Hypertension   . Head injury   . Dysphagia   . Presence of permanent cardiac pacemaker 02/01/2003    [employer]  . Pacemaker generator end of life 12/18/2011    [employer]  . Syncope     Past Surgical History  Procedure Laterality Date  . Total shoulder arthroplasty    . Pacemaker insertion  2005    [employer]  . Loop recorder implant/explant  2005  . Pacemaker generator change  12/18/2011    [employer]  . Nm myocar perf wall motion  01/2011    Negative  . Esophagogastroduodenoscopy      [place]  . Left and right heart catheterization with coronary angiogram N/A 12/17/2011    Procedure: LEFT AND RIGHT HEART CATHETERIZATION WITH CORONARY ANGIOGRAM;  Surgeon: [name], MD;  Location: [facility] CATH LAB;  Service: Cardiovascular;  Laterality: N/A;  . Permanent pacemaker generator change N/A 12/18/2011    Procedure: PERMANENT PACEMAKER GENERATOR CHANGE;  Surgeon: [name], MD;  Location: [facility] CATH LAB;  Service: Cardiovascular;  Laterality: N/A;     Current Outpatient Prescriptions  Medication Sig Dispense Refill  . acetaminophen (TYLENOL) 325 MG tablet Take  1-2 tablets (325-650 mg total) by mouth every 4 (four) hours as needed. 2 tablet   . amLODipine (NORVASC) 10 MG tablet Take 10 mg by mouth daily.    [name] aspirin 81 MG tablet Take 81 mg by mouth daily.    . diazepam (VALIUM) 5 MG tablet Take 5 mg by mouth daily as needed for anxiety.     . Multiple Vitamins-Minerals (MULTIVITAMIN WITH MINERALS) tablet Take 1 tablet by mouth daily.    . pantoprazole (PROTONIX) 40 MG tablet Take 1 tablet (40 mg  total) by mouth daily. 30 tablet 11  . [DISCONTINUED] benazepril (LOTENSIN) 20 MG tablet Take 20 mg by mouth daily.     No current facility-administered medications for this visit.    Allergies:   Apple; Carrot; Iodinated diagnostic agents; Iohexol; Oat; Soy allergy; Wheat; Almond meal; and Cow's milk    Social History:  The patient  reports that he quit smoking about 18 years ago. His smoking use included Cigarettes. He has a 15 pack-year smoking history. He has never used smokeless tobacco. He reports that he does not drink alcohol or use illicit drugs.     ROS:  Please see the history of present illness.    Otherwise, review of systems positive for none.   All other systems are reviewed and negative.    PHYSICAL EXAM: VS:  BP 120/80 mmHg  Pulse 87  Resp 16  Ht 6' (1.829 m)  Wt 220 lb 14.4 oz (100.2 kg)  BMI 29.95 kg/m2 , BMI Body mass index is 29.95 kg/(m^2).  General: Alert, oriented x3, no distress Head: no evidence of trauma, PERRL, EOMI, no exophtalmos or lid lag, no myxedema, no xanthelasma; normal ears, nose and oropharynx Neck: normal jugular venous pulsations and no hepatojugular reflux; brisk carotid pulses without delay and no carotid bruits Chest: clear to auscultation, no signs of consolidation by percussion or palpation, normal fremitus, symmetrical and full respiratory excursions,  Cardiovascular: normal position and quality of the apical impulse, regular rhythm, normal first and second heart sounds, no murmurs, rubs or gallops Abdomen: no tenderness or distention, no masses by palpation, no abnormal pulsatility or arterial bruits, normal bowel sounds, no hepatosplenomegaly Extremities: no clubbing, cyanosis or edema; 2+ radial, ulnar and brachial pulses bilaterally; 2+ right femoral, posterior tibial and dorsalis pedis pulses; 2+ left femoral, posterior tibial and dorsalis pedis pulses; no subclavian or femoral bruits Neurological: grossly nonfocal Psych: euthymic  mood, full affect   EKG:  EKG is ordered today. The ekg ordered today demonstrates  Atrial paced, ventricular sensed, nonspecific ST-T changes, QTc 442 ms   Wt Readings from Last 3 Encounters:  12/05/14 220 lb 14.4 oz (100.2 kg)  11/21/13 221 lb 12.8 oz (100.608 kg)  10/14/13 220 lb 10.9 oz (100.1 kg)      ASSESSMENT AND PLAN:  1.  Exertional dyspnea , mild chest tightness. The symptoms may be related to insufficient rate response from his pacemaker. On the other hand to be concerned about the possibility of progressive of coronary artery disease. He had minor nonobstructive coronary atherosclerosis by angiography in 2013 (40% proximal RCA).  Recommended he undergo treadmill Myoview. This will allow for evaluation of appropriateness of center settings as well as exclusion of new coronary problems.  2.  Symptomatic sinus bradycardia with normal function of dual-chamber permanent pacemaker. We'll evaluate for options for improved sensor settings, but the basic pacemaker function is normal  3.  Hypertension, well-controlled    Current medicines are reviewed at  length with the patient today.  The patient does not have concerns regarding medicines.  The following changes have been made:  no change  Labs/ tests ordered today include:  Orders Placed This Encounter  Procedures  . Myocardial Perfusion Imaging  . Implantable device check  . EKG 12-Lead    Patient Instructions  Remote monitoring is used to monitor your pacemaker from home. This monitoring reduces the number of office visits required to check your device to one time per year. It allows [place] to keep an eye on the functioning of your device to ensure it is working properly. You are scheduled for a device check from home on 03/06/15. You may send your transmission at any time that day. If you have a wireless device, the transmission will be sent automatically. After your physician reviews your transmission, you will receive a  postcard with your next transmission date.  Your physician recommends that you schedule a follow-up appointment in: 6 months with Dr.Deonna Krummel  Your physician has requested that you have en exercise stress myoview. For further information please visit [email or web address]. Please follow instruction sheet, as given.       Mikael Spray, MD  12/07/2014 10:20 PM    Sanda Klein, MD, [facility] [phone] office [phone] pager

## 2014-12-18 ENCOUNTER — Telehealth (HOSPITAL_COMMUNITY): Payer: Self-pay

## 2014-12-18 NOTE — Telephone Encounter (Signed)
Encounter complete. 

## 2014-12-19 ENCOUNTER — Telehealth (HOSPITAL_COMMUNITY): Payer: Self-pay

## 2014-12-19 NOTE — Telephone Encounter (Signed)
Pt to come for testing on Tuesday. Encounter complete.

## 2014-12-25 ENCOUNTER — Encounter: Payer: Self-pay | Admitting: Cardiovascular Disease

## 2014-12-25 ENCOUNTER — Telehealth: Payer: Self-pay | Admitting: *Deleted

## 2014-12-25 ENCOUNTER — Other Ambulatory Visit: Payer: Self-pay | Admitting: *Deleted

## 2014-12-25 ENCOUNTER — Ambulatory Visit (HOSPITAL_COMMUNITY)
Admission: RE | Admit: 2014-12-25 | Discharge: 2014-12-25 | Disposition: A | Discharge status: Home / Self Care | Payer: Medicare HMO | Source: Ambulatory Visit | Attending: Cardiovascular Disease | Admitting: Cardiovascular Disease

## 2014-12-25 DIAGNOSIS — R5383 Other fatigue: Secondary | ICD-10-CM | POA: Insufficient documentation

## 2014-12-25 DIAGNOSIS — I2510 Atherosclerotic heart disease of native coronary artery without angina pectoris: Secondary | ICD-10-CM | POA: Diagnosis not present

## 2014-12-25 DIAGNOSIS — R079 Chest pain, unspecified: Secondary | ICD-10-CM | POA: Diagnosis not present

## 2014-12-25 DIAGNOSIS — R9439 Abnormal result of other cardiovascular function study: Secondary | ICD-10-CM | POA: Insufficient documentation

## 2014-12-25 DIAGNOSIS — I10 Essential (primary) hypertension: Secondary | ICD-10-CM | POA: Insufficient documentation

## 2014-12-25 DIAGNOSIS — R42 Dizziness and giddiness: Secondary | ICD-10-CM | POA: Insufficient documentation

## 2014-12-25 DIAGNOSIS — R0609 Other forms of dyspnea: Secondary | ICD-10-CM | POA: Insufficient documentation

## 2014-12-25 DIAGNOSIS — Z79899 Other long term (current) drug therapy: Secondary | ICD-10-CM

## 2014-12-25 DIAGNOSIS — D689 Coagulation defect, unspecified: Secondary | ICD-10-CM | POA: Diagnosis not present

## 2014-12-25 DIAGNOSIS — Z01812 Encounter for preprocedural laboratory examination: Secondary | ICD-10-CM

## 2014-12-25 DIAGNOSIS — Z87891 Personal history of nicotine dependence: Secondary | ICD-10-CM | POA: Diagnosis not present

## 2014-12-25 LAB — MYOCARDIAL PERFUSION IMAGING
CHL CUP NUCLEAR SSS: 11
CSEPEW: 10.1 METS
CSEPHR: 90 %
CSEPPHR: 141 {beats}/min
Exercise duration (min): 9 min
Exercise duration (sec): 0 s
LV dias vol: 90 mL
LVSYSVOL: 38 mL
MPHR: 155 {beats}/min
RPE: 17
Rest HR: 71 {beats}/min
SDS: 0
SRS: 11
TID: 1.51

## 2014-12-25 MED ORDER — TECHNETIUM TC 99M SESTAMIBI GENERIC - CARDIOLITE
11.0000 | Freq: Once | INTRAVENOUS | Status: AC | PRN
  Administered 2014-12-25: 11 via INTRAVENOUS

## 2014-12-25 MED ORDER — TECHNETIUM TC 99M SESTAMIBI GENERIC - CARDIOLITE
32.1000 | Freq: Once | INTRAVENOUS | Status: AC | PRN
  Administered 2014-12-25: 32.1 via INTRAVENOUS

## 2014-12-25 NOTE — Telephone Encounter (Signed)
Order placed for [facility] w/possible PCI Wednesday 12/26/15 with Dr. Claiborne Billings.  Arrive at 7:30 for cath at 9:30am. Patient notified to come to the office and pick up paperwork with instructions for the cath and lab order, which he will have done this PM at [facility].  Order placed and sent to Dr. Claiborne Billings for review and signature.

## 2014-12-25 NOTE — Telephone Encounter (Signed)
-----   Message from Sanda Klein, MD sent at 12/25/2014 12:43 PM EST ----- Please schedule for  Cardiac cath and PCI this week with Dr. Ellyn Hack (preferably) or another interventionalist for abnormal nuclear scan. I have spoken with the patient by phone.

## 2014-12-26 ENCOUNTER — Ambulatory Visit (HOSPITAL_COMMUNITY)
Admission: RE | Admit: 2014-12-26 | Discharge: 2014-12-26 | Disposition: A | Discharge status: Home / Self Care | Payer: Medicare HMO | Source: Ambulatory Visit | Attending: Cardiovascular Disease | Admitting: Cardiovascular Disease

## 2014-12-26 ENCOUNTER — Encounter (HOSPITAL_COMMUNITY)
Admission: RE | Disposition: A | Discharge status: Home / Self Care | Payer: Self-pay | Source: Ambulatory Visit | Attending: Cardiovascular Disease

## 2014-12-26 DIAGNOSIS — Z7982 Long term (current) use of aspirin: Secondary | ICD-10-CM | POA: Insufficient documentation

## 2014-12-26 DIAGNOSIS — I10 Essential (primary) hypertension: Secondary | ICD-10-CM | POA: Insufficient documentation

## 2014-12-26 DIAGNOSIS — Z950 Presence of cardiac pacemaker: Secondary | ICD-10-CM | POA: Insufficient documentation

## 2014-12-26 DIAGNOSIS — K219 Gastro-esophageal reflux disease without esophagitis: Secondary | ICD-10-CM | POA: Insufficient documentation

## 2014-12-26 DIAGNOSIS — R931 Abnormal findings on diagnostic imaging of heart and coronary circulation: Secondary | ICD-10-CM | POA: Diagnosis not present

## 2014-12-26 DIAGNOSIS — R9439 Abnormal result of other cardiovascular function study: Secondary | ICD-10-CM | POA: Insufficient documentation

## 2014-12-26 DIAGNOSIS — I2510 Atherosclerotic heart disease of native coronary artery without angina pectoris: Secondary | ICD-10-CM | POA: Insufficient documentation

## 2014-12-26 DIAGNOSIS — R0609 Other forms of dyspnea: Secondary | ICD-10-CM | POA: Diagnosis not present

## 2014-12-26 DIAGNOSIS — Z87891 Personal history of nicotine dependence: Secondary | ICD-10-CM | POA: Insufficient documentation

## 2014-12-26 DIAGNOSIS — R001 Bradycardia, unspecified: Secondary | ICD-10-CM | POA: Diagnosis not present

## 2014-12-26 HISTORY — PX: CARDIAC CATHETERIZATION: SHX172

## 2014-12-26 LAB — COMPREHENSIVE METABOLIC PANEL
ALBUMIN: 4.2 g/dL (ref 3.6–5.1)
ALT: 30 U/L (ref 9–46)
AST: 43 U/L — AB (ref 10–35)
Alkaline Phosphatase: 73 U/L (ref 40–115)
BILIRUBIN TOTAL: 0.7 mg/dL (ref 0.2–1.2)
BUN: 25 mg/dL (ref 7–25)
CHLORIDE: 100 mmol/L (ref 98–110)
CO2: 28 mmol/L (ref 20–31)
CREATININE: 1.57 mg/dL — AB (ref 0.70–1.25)
Calcium: 9.4 mg/dL (ref 8.6–10.3)
Glucose, Bld: 88 mg/dL (ref 65–99)
Potassium: 3.9 mmol/L (ref 3.5–5.3)
SODIUM: 140 mmol/L (ref 135–146)
TOTAL PROTEIN: 6.8 g/dL (ref 6.1–8.1)

## 2014-12-26 LAB — BASIC METABOLIC PANEL
Anion gap: 8 (ref 5–15)
BUN: 22 mg/dL — ABNORMAL HIGH (ref 6–20)
CO2: 28 mmol/L (ref 22–32)
Calcium: 9.6 mg/dL (ref 8.9–10.3)
Chloride: 104 mmol/L (ref 101–111)
Creatinine, Ser: 1.64 mg/dL — ABNORMAL HIGH (ref 0.61–1.24)
GFR calc Af Amer: 49 mL/min — ABNORMAL LOW (ref >60)
GFR calc non Af Amer: 42 mL/min — ABNORMAL LOW (ref >60)
Glucose, Bld: 101 mg/dL — ABNORMAL HIGH (ref 65–99)
Potassium: 3.9 mmol/L (ref 3.5–5.1)
Sodium: 140 mmol/L (ref 135–145)

## 2014-12-26 LAB — CBC
HCT: 44.1 % (ref 39.0–52.0)
HCT: 43.5 % (ref 39.0–52.0)
Hemoglobin: 14.7 g/dL (ref 13.0–17.0)
Hemoglobin: 14.7 g/dL (ref 13.0–17.0)
MCH: 29.2 pg (ref 26.0–34.0)
MCH: 29.6 pg (ref 26.0–34.0)
MCHC: 33.3 g/dL (ref 30.0–36.0)
MCHC: 33.8 g/dL (ref 30.0–36.0)
MCV: 87.7 fL (ref 78.0–100.0)
MCV: 87.5 fL (ref 78.0–100.0)
MPV: 10.7 fL (ref 8.6–12.4)
PLATELETS: 228 10*3/uL (ref 150–400)
Platelets: 214 10*3/uL (ref 150–400)
RBC: 5.03 MIL/uL (ref 4.22–5.81)
RBC: 4.97 MIL/uL (ref 4.22–5.81)
RDW: 14.5 % (ref 11.5–15.5)
RDW: 14.4 % (ref 11.5–15.5)
WBC: 6.9 10*3/uL (ref 4.0–10.5)
WBC: 6.6 10*3/uL (ref 4.0–10.5)

## 2014-12-26 LAB — APTT: aPTT: 47 seconds — ABNORMAL HIGH (ref 24–37)

## 2014-12-26 LAB — PROTIME-INR
INR: 0.99 (ref <1.50)
INR: 0.99 (ref 0.00–1.49)
Prothrombin Time: 13.2 seconds (ref 11.6–15.2)
Prothrombin Time: 13.3 seconds (ref 11.6–15.2)

## 2014-12-26 SURGERY — LEFT HEART CATH AND CORONARY ANGIOGRAPHY

## 2014-12-26 MED ORDER — SODIUM CHLORIDE 0.9 % WEIGHT BASED INFUSION: 3.0000 mL/kg/h | INTRAVENOUS | Status: DC

## 2014-12-26 MED ORDER — DIPHENHYDRAMINE HCL 50 MG/ML IJ SOLN
INTRAMUSCULAR | Status: AC
  Administered 2014-12-26: 25 mg via INTRAVENOUS
  Filled 2014-12-26: qty 1

## 2014-12-26 MED ORDER — FAMOTIDINE IN NACL 20-0.9 MG/50ML-% IV SOLN
INTRAVENOUS | Status: AC
  Administered 2014-12-26: 20 mg via INTRAVENOUS
  Filled 2014-12-26: qty 50

## 2014-12-26 MED ORDER — SODIUM CHLORIDE 0.9 % IJ SOLN: 3.0000 mL | Freq: Two times a day (BID) | INTRAMUSCULAR | Status: DC

## 2014-12-26 MED ORDER — LIDOCAINE HCL (PF) 1 % IJ SOLN
INTRAMUSCULAR | Status: DC | PRN
  Administered 2014-12-26: 11:00:00

## 2014-12-26 MED ORDER — MIDAZOLAM HCL 2 MG/2ML IJ SOLN
INTRAMUSCULAR | Status: DC | PRN
  Administered 2014-12-26: 2 mg via INTRAVENOUS

## 2014-12-26 MED ORDER — HEPARIN (PORCINE) IN NACL 2-0.9 UNIT/ML-% IJ SOLN
INTRAMUSCULAR | Status: AC
  Filled 2014-12-26: qty 1000

## 2014-12-26 MED ORDER — SODIUM CHLORIDE 0.9 % WEIGHT BASED INFUSION
1.0000 mL/kg/h | INTRAVENOUS | Status: DC
  Administered 2014-12-26: 1 mL/kg/h via INTRAVENOUS

## 2014-12-26 MED ORDER — DIPHENHYDRAMINE HCL 50 MG/ML IJ SOLN
25.0000 mg | Freq: Once | INTRAMUSCULAR | Status: AC
  Administered 2014-12-26: 25 mg via INTRAVENOUS

## 2014-12-26 MED ORDER — HEPARIN SODIUM (PORCINE) 1000 UNIT/ML IJ SOLN
INTRAMUSCULAR | Status: AC
  Filled 2014-12-26: qty 1

## 2014-12-26 MED ORDER — SODIUM CHLORIDE 0.9 % IV SOLN: 250.0000 mL | INTRAVENOUS | Status: DC | PRN

## 2014-12-26 MED ORDER — METHYLPREDNISOLONE SODIUM SUCC 125 MG IJ SOLR
INTRAMUSCULAR | Status: AC
  Administered 2014-12-26: 125 mg via INTRAVENOUS
  Filled 2014-12-26: qty 2

## 2014-12-26 MED ORDER — SODIUM CHLORIDE 0.9 % IJ SOLN: 3.0000 mL | INTRAMUSCULAR | Status: DC | PRN

## 2014-12-26 MED ORDER — FAMOTIDINE IN NACL 20-0.9 MG/50ML-% IV SOLN
20.0000 mg | Freq: Once | INTRAVENOUS | Status: AC
  Administered 2014-12-26: 20 mg via INTRAVENOUS

## 2014-12-26 MED ORDER — VERAPAMIL HCL 2.5 MG/ML IV SOLN
INTRA_ARTERIAL | Status: DC | PRN
  Administered 2014-12-26: 10:00:00 via INTRA_ARTERIAL

## 2014-12-26 MED ORDER — NITROGLYCERIN 1 MG/10 ML FOR IR/CATH LAB
INTRA_ARTERIAL | Status: AC
  Filled 2014-12-26: qty 10

## 2014-12-26 MED ORDER — IOHEXOL 350 MG/ML SOLN
INTRAVENOUS | Status: DC | PRN
  Administered 2014-12-26: 50 mL via INTRA_ARTERIAL

## 2014-12-26 MED ORDER — FENTANYL CITRATE (PF) 100 MCG/2ML IJ SOLN
INTRAMUSCULAR | Status: DC | PRN
  Administered 2014-12-26: 50 ug via INTRAVENOUS

## 2014-12-26 MED ORDER — VERAPAMIL HCL 2.5 MG/ML IV SOLN
INTRAVENOUS | Status: AC
  Filled 2014-12-26: qty 2

## 2014-12-26 MED ORDER — HEPARIN SODIUM (PORCINE) 1000 UNIT/ML IJ SOLN
INTRAMUSCULAR | Status: DC | PRN
  Administered 2014-12-26: 5000 [IU] via INTRAVENOUS

## 2014-12-26 MED ORDER — ACETAMINOPHEN 325 MG PO TABS: 650.0000 mg | ORAL_TABLET | ORAL | Status: DC | PRN

## 2014-12-26 MED ORDER — ONDANSETRON HCL 4 MG/2ML IJ SOLN: 4.0000 mg | Freq: Four times a day (QID) | INTRAMUSCULAR | Status: DC | PRN

## 2014-12-26 MED ORDER — SODIUM CHLORIDE 0.9 % WEIGHT BASED INFUSION
3.0000 mL/kg/h | INTRAVENOUS | Status: AC
  Administered 2014-12-26: 3 mL/kg/h via INTRAVENOUS

## 2014-12-26 MED ORDER — FENTANYL CITRATE (PF) 100 MCG/2ML IJ SOLN
INTRAMUSCULAR | Status: AC
  Filled 2014-12-26: qty 2

## 2014-12-26 MED ORDER — LIDOCAINE HCL (PF) 1 % IJ SOLN
INTRAMUSCULAR | Status: AC
  Filled 2014-12-26: qty 30

## 2014-12-26 MED ORDER — MIDAZOLAM HCL 2 MG/2ML IJ SOLN
INTRAMUSCULAR | Status: AC
  Filled 2014-12-26: qty 2

## 2014-12-26 MED ORDER — METHYLPREDNISOLONE SODIUM SUCC 125 MG IJ SOLR
125.0000 mg | Freq: Once | INTRAMUSCULAR | Status: AC
  Administered 2014-12-26: 125 mg via INTRAVENOUS

## 2014-12-26 SURGICAL SUPPLY — 10 items
CATH INFINITI 5FR ANG PIGTAIL (CATHETERS) ×2 IMPLANT
CATH OPTITORQUE TIG 4.0 5F (CATHETERS) ×2 IMPLANT
DEVICE RAD COMP TR BAND LRG (VASCULAR PRODUCTS) ×2 IMPLANT
GLIDESHEATH SLEND A-KIT 6F 22G (SHEATH) ×2 IMPLANT
KIT HEART LEFT (KITS) ×2 IMPLANT
PACK CARDIAC CATHETERIZATION (CUSTOM PROCEDURE TRAY) ×2 IMPLANT
SYR MEDRAD MARK V 150ML (SYRINGE) ×2 IMPLANT
TRANSDUCER W/STOPCOCK (MISCELLANEOUS) ×2 IMPLANT
TUBING CIL FLEX 10 FLL-RA (TUBING) ×2 IMPLANT
WIRE SAFE-T 1.5MM-J .035X260CM (WIRE) ×2 IMPLANT

## 2014-12-26 NOTE — H&P (View-Only) (Signed)
Patient ID: RIVERS HILDEBRAN, male   DOB: 1950/06/05, 64 y.o.   MRN: [id number]     Cardiology Office Note   Date:  12/07/2014   ID:  Warner, Barling Dec 06, 1950, MRN [id number]  PCP:  Maximino Greenland, MD  Cardiologist:   Sanda Klein, MD   Chief Complaint  Patient presents with  . Annual Exam    patient reports little chest tightness, shortness of breath with exertion - can't walk up a flight of stairs w/o getting short odf breath, no energy after exercise      History of Present Illness: KAULANA BUNKER is a 64 y.o. male who presents for acemaker check. His current pacemaker was a generator change performed in 2013. His device was initially implanted for symptomatic bradycardia and pauses with syncope. He has marked sinus bradycardia and over 90% atrial pacing, but virtually never requires ventricular pacing.   He has noticed a substantial reduction in his exercise ability, although he still continues to go to the gym regularly. He has noticed that he cannot walk up a flight of stairs without becoming short of breath and he feels exhausted after his usual gym routine. He has had a little bit of chest tightness but this does not appear to be a prominent complaint.   Pacemaker interrogation shows normal lead parameters and similar pacing prevalence as before : 92% atrial pacing, 0.3% ventricular pacing. His histogram is somewhat blunted.  No significant episodes of mode switch or high ventricular rates have been recorded.  Electrocardiogram shows atrial paced ventricular sensed rhythm with pre-existing nonspecific repolarization changes and no major abnormalities. No significant episodes  He initially received a pacemaker in 2005 after several episodes of syncope and documented sinus pauses on a loop recorder. At generator change out was performed for ERI in 2014 . He has treated systemic hypertension and a history of head injury and cervical spine problems. Chest pain in 2005 and  in 2013 led to coronary angiography which showed minor plaque without any meaningful stenoses. Note that in 2005, despite severely elevated BP, there was no evidence of renal artery stenosis. In addition to hypertension he has gastroesophageal reflux disease and earlier this year had pneumonia. He is still recovering from this.   Past Medical History  Diagnosis Date  . Coronary artery disease   . Hypertension   . Head injury   . Dysphagia   . Presence of permanent cardiac pacemaker 02/01/2003    [employer]  . Pacemaker generator end of life 12/18/2011    [employer]  . Syncope     Past Surgical History  Procedure Laterality Date  . Total shoulder arthroplasty    . Pacemaker insertion  2005    [employer]  . Loop recorder implant/explant  2005  . Pacemaker generator change  12/18/2011    [employer]  . Nm myocar perf wall motion  01/2011    Negative  . Esophagogastroduodenoscopy      [place]  . Left and right heart catheterization with coronary angiogram N/A 12/17/2011    Procedure: LEFT AND RIGHT HEART CATHETERIZATION WITH CORONARY ANGIOGRAM;  Surgeon: [name], MD;  Location: [facility] CATH LAB;  Service: Cardiovascular;  Laterality: N/A;  . Permanent pacemaker generator change N/A 12/18/2011    Procedure: PERMANENT PACEMAKER GENERATOR CHANGE;  Surgeon: [name], MD;  Location: [facility] CATH LAB;  Service: Cardiovascular;  Laterality: N/A;     Current Outpatient Prescriptions  Medication Sig Dispense Refill  . acetaminophen (TYLENOL) 325 MG tablet Take  1-2 tablets (325-650 mg total) by mouth every 4 (four) hours as needed. 2 tablet   . amLODipine (NORVASC) 10 MG tablet Take 10 mg by mouth daily.    [name] aspirin 81 MG tablet Take 81 mg by mouth daily.    . diazepam (VALIUM) 5 MG tablet Take 5 mg by mouth daily as needed for anxiety.     . Multiple Vitamins-Minerals (MULTIVITAMIN WITH MINERALS) tablet Take 1 tablet by mouth daily.    . pantoprazole (PROTONIX) 40 MG tablet Take 1 tablet (40 mg  total) by mouth daily. 30 tablet 11  . [DISCONTINUED] benazepril (LOTENSIN) 20 MG tablet Take 20 mg by mouth daily.     No current facility-administered medications for this visit.    Allergies:   Apple; Carrot; Iodinated diagnostic agents; Iohexol; Oat; Soy allergy; Wheat; Almond meal; and Cow's milk    Social History:  The patient  reports that he quit smoking about 18 years ago. His smoking use included Cigarettes. He has a 15 pack-year smoking history. He has never used smokeless tobacco. He reports that he does not drink alcohol or use illicit drugs.     ROS:  Please see the history of present illness.    Otherwise, review of systems positive for none.   All other systems are reviewed and negative.    PHYSICAL EXAM: VS:  BP 120/80 mmHg  Pulse 87  Resp 16  Ht 6' (1.829 m)  Wt 220 lb 14.4 oz (100.2 kg)  BMI 29.95 kg/m2 , BMI Body mass index is 29.95 kg/(m^2).  General: Alert, oriented x3, no distress Head: no evidence of trauma, PERRL, EOMI, no exophtalmos or lid lag, no myxedema, no xanthelasma; normal ears, nose and oropharynx Neck: normal jugular venous pulsations and no hepatojugular reflux; brisk carotid pulses without delay and no carotid bruits Chest: clear to auscultation, no signs of consolidation by percussion or palpation, normal fremitus, symmetrical and full respiratory excursions,  Cardiovascular: normal position and quality of the apical impulse, regular rhythm, normal first and second heart sounds, no murmurs, rubs or gallops Abdomen: no tenderness or distention, no masses by palpation, no abnormal pulsatility or arterial bruits, normal bowel sounds, no hepatosplenomegaly Extremities: no clubbing, cyanosis or edema; 2+ radial, ulnar and brachial pulses bilaterally; 2+ right femoral, posterior tibial and dorsalis pedis pulses; 2+ left femoral, posterior tibial and dorsalis pedis pulses; no subclavian or femoral bruits Neurological: grossly nonfocal Psych: euthymic  mood, full affect   EKG:  EKG is ordered today. The ekg ordered today demonstrates  Atrial paced, ventricular sensed, nonspecific ST-T changes, QTc 442 ms   Wt Readings from Last 3 Encounters:  12/05/14 220 lb 14.4 oz (100.2 kg)  11/21/13 221 lb 12.8 oz (100.608 kg)  10/14/13 220 lb 10.9 oz (100.1 kg)      ASSESSMENT AND PLAN:  1.  Exertional dyspnea , mild chest tightness. The symptoms may be related to insufficient rate response from his pacemaker. On the other hand to be concerned about the possibility of progressive of coronary artery disease. He had minor nonobstructive coronary atherosclerosis by angiography in 2013 (40% proximal RCA).  Recommended he undergo treadmill Myoview. This will allow for evaluation of appropriateness of center settings as well as exclusion of new coronary problems.  2.  Symptomatic sinus bradycardia with normal function of dual-chamber permanent pacemaker. We'll evaluate for options for improved sensor settings, but the basic pacemaker function is normal  3.  Hypertension, well-controlled    Current medicines are reviewed at  length with the patient today.  The patient does not have concerns regarding medicines.  The following changes have been made:  no change  Labs/ tests ordered today include:  Orders Placed This Encounter  Procedures  . Myocardial Perfusion Imaging  . Implantable device check  . EKG 12-Lead    Patient Instructions  Remote monitoring is used to monitor your pacemaker from home. This monitoring reduces the number of office visits required to check your device to one time per year. It allows [place] to keep an eye on the functioning of your device to ensure it is working properly. You are scheduled for a device check from home on 03/06/15. You may send your transmission at any time that day. If you have a wireless device, the transmission will be sent automatically. After your physician reviews your transmission, you will receive a  postcard with your next transmission date.  Your physician recommends that you schedule a follow-up appointment in: 6 months with Dr.Vilda Zollner  Your physician has requested that you have en exercise stress myoview. For further information please visit [email or web address]. Please follow instruction sheet, as given.       Mikael Spray, MD  12/07/2014 10:20 PM    Sanda Klein, MD, [facility] [phone] office [phone] pager

## 2014-12-26 NOTE — Interval H&P Note (Signed)
Cath Lab Visit (complete for each Cath Lab visit)  Clinical Evaluation Leading to the Procedure:   ACS: No.  Non-ACS:    Anginal Classification: CCS III  Anti-ischemic medical therapy: Minimal Therapy (1 class of medications)  Non-Invasive Test Results: Low-risk stress test findings: cardiac mortality <1%/year  Prior CABG: No previous CABG      History and Physical Interval Note:  12/26/2014 10:04 AM  Aaron Snyder  has presented today for surgery, with the diagnosis of abnormal nuc  The various methods of treatment have been discussed with the patient and family. After consideration of risks, benefits and other options for treatment, the patient has consented to  Procedure(s): Left Heart Cath and Coronary Angiography (N/A) as a surgical intervention .  The patient's history has been reviewed, patient examined, no change in status, stable for surgery.  I have reviewed the patient's chart and labs.  Questions were answered to the patient's satisfaction.     Aaron Snyder A

## 2014-12-26 NOTE — Discharge Instructions (Signed)
Radial Site Care °Refer to this sheet in the next few weeks. These instructions provide you with information about caring for yourself after your procedure. Your health care provider may also give you more specific instructions. Your treatment has been planned according to current medical practices, but problems sometimes occur. Call your health care provider if you have any problems or questions after your procedure. °WHAT TO EXPECT AFTER THE PROCEDURE °After your procedure, it is typical to have the following: °· Bruising at the radial site that usually fades within 1-2 weeks. °· Blood collecting in the tissue (hematoma) that may be painful to the touch. It should usually decrease in size and tenderness within 1-2 weeks. °HOME CARE INSTRUCTIONS °· Take medicines only as directed by your health care provider. °· You may shower 24-48 hours after the procedure or as directed by your health care provider. Remove the bandage (dressing) and gently wash the site with plain soap and water. Pat the area dry with a clean towel. Do not rub the site, because this may cause bleeding. °· Do not take baths, swim, or use a hot tub until your health care provider approves. °· Check your insertion site every day for redness, swelling, or drainage. °· Do not apply powder or lotion to the site. °· Do not flex or bend the affected arm for 24 hours or as directed by your health care provider. °· Do not push or pull heavy objects with the affected arm for 24 hours or as directed by your health care provider. °· Do not lift over 10 lb (4.5 kg) for 5 days after your procedure or as directed by your health care provider. °· Ask your health care provider when it is okay to: °¨ Return to work or school. °¨ Resume usual physical activities or sports. °¨ Resume sexual activity. °· Do not drive home if you are discharged the same day as the procedure. Have someone else drive you. °· You may drive 24 hours after the procedure unless otherwise  instructed by your health care provider. °· Do not operate machinery or power tools for 24 hours after the procedure. °· If your procedure was done as an outpatient procedure, which means that you went home the same day as your procedure, a responsible adult should be with you for the first 24 hours after you arrive home. °· Keep all follow-up visits as directed by your health care provider. This is important. °SEEK MEDICAL CARE IF: °· You have a fever. °· You have chills. °· You have increased bleeding from the radial site. Hold pressure on the site. °SEEK IMMEDIATE MEDICAL CARE IF: °· You have unusual pain at the radial site. °· You have redness, warmth, or swelling at the radial site. °· You have drainage (other than a small amount of blood on the dressing) from the radial site. °· The radial site is bleeding, and the bleeding does not stop after 30 minutes of holding steady pressure on the site. °· Your arm or hand becomes pale, cool, tingly, or numb. °  °This information is not intended to replace advice given to you by your health care provider. Make sure you discuss any questions you have with your health care provider. °  °Document Released: 02/14/2010 Document Revised: 02/02/2014 Document Reviewed: 07/31/2013 °Elsevier Interactive Patient Education ©2016 [employer] ° °

## 2014-12-27 ENCOUNTER — Encounter (HOSPITAL_COMMUNITY): Payer: Self-pay | Admitting: Cardiovascular Disease

## 2014-12-27 ENCOUNTER — Telehealth: Payer: Self-pay | Admitting: Cardiovascular Disease

## 2014-12-27 NOTE — Telephone Encounter (Signed)
Recommendations for OTC meds given to patient. Recommendations regarding arm numbness communicated.  Pt scheduled for return visit in January. He verbalized understanding of instructions and acknowledgement of appt info.

## 2014-12-27 NOTE — Telephone Encounter (Signed)
Pt had Cath yesterday,does he need a follow up appointment? Pt says is having an allergic reaction to the dye>His hand and arm is going numb from where he is keeping it in a certain position.

## 2014-12-27 NOTE — Telephone Encounter (Signed)
Aaron Snyder states 3 concerns:  1. Does he need f/u visit after cath? >> Deferring to Dr. Sallyanne Kuster 2. He is having burning sensation in stomach, hiccups & heartburn. Notes having this problem in past as he is allergic to contrast dye. >> Informed pt I would get specific recommendation, probably course is PO benadryl if not driving/resting at home, would ask if additional meds recommended. 3. Having numbness particularly in 3 fingers in left hand, going up to outside of left arm. >> He notes recommendation to keep arm in certain position, he has 3 more hrs left. Advised numbness should resolve once normal ROM resumed.  Routed to Dr. Sallyanne Kuster to advise.

## 2014-12-27 NOTE — Telephone Encounter (Signed)
I'd like to see him back in about 4-6 weeks or so.  those are unusual symptoms of a dye allergy, sounds more like acid reflux. I have no problem with him taking Benadryl but he does have to understand that this will have sedating effect and as you mentioned avoid driving. Would recommend that he try taking over-the-counter ranitidine (Zantac) or famotidine (Pepcid) as well. I do believe his arm numbness will resolve once he moves his arm around more. If not please call back.

## 2015-01-11 DIAGNOSIS — I129 Hypertensive chronic kidney disease with stage 1 through stage 4 chronic kidney disease, or unspecified chronic kidney disease: Secondary | ICD-10-CM | POA: Diagnosis not present

## 2015-01-11 DIAGNOSIS — N182 Chronic kidney disease, stage 2 (mild): Secondary | ICD-10-CM | POA: Diagnosis not present

## 2015-01-11 DIAGNOSIS — Z0000 Encounter for general adult medical examination without abnormal findings: Secondary | ICD-10-CM | POA: Diagnosis not present

## 2015-01-11 DIAGNOSIS — Z1211 Encounter for screening for malignant neoplasm of colon: Secondary | ICD-10-CM | POA: Diagnosis not present

## 2015-01-11 DIAGNOSIS — R5383 Other fatigue: Secondary | ICD-10-CM | POA: Diagnosis not present

## 2015-01-11 DIAGNOSIS — E039 Hypothyroidism, unspecified: Secondary | ICD-10-CM | POA: Diagnosis not present

## 2015-02-22 ENCOUNTER — Encounter: Payer: Self-pay | Admitting: Cardiovascular Disease

## 2015-02-22 ENCOUNTER — Ambulatory Visit (INDEPENDENT_AMBULATORY_CARE_PROVIDER_SITE_OTHER): Payer: Medicare HMO | Admitting: Cardiovascular Disease

## 2015-02-22 VITALS — BP 130/94 | HR 71 | Ht 72.0 in | Wt 217.2 lb

## 2015-02-22 DIAGNOSIS — I2510 Atherosclerotic heart disease of native coronary artery without angina pectoris: Secondary | ICD-10-CM | POA: Diagnosis not present

## 2015-02-22 DIAGNOSIS — I495 Sick sinus syndrome: Secondary | ICD-10-CM | POA: Insufficient documentation

## 2015-02-22 DIAGNOSIS — R079 Chest pain, unspecified: Secondary | ICD-10-CM | POA: Diagnosis not present

## 2015-02-22 DIAGNOSIS — Z950 Presence of cardiac pacemaker: Secondary | ICD-10-CM

## 2015-02-22 DIAGNOSIS — I10 Essential (primary) hypertension: Secondary | ICD-10-CM

## 2015-02-22 DIAGNOSIS — I498 Other specified cardiac arrhythmias: Secondary | ICD-10-CM

## 2015-02-22 MED ORDER — AZILSARTAN-CHLORTHALIDONE 40-25 MG PO TABS: 1.0000 | ORAL_TABLET | Freq: Every day | ORAL | Status: DC

## 2015-02-22 NOTE — Patient Instructions (Signed)
Your physician has recommended you make the following change in your medication: continue current medications  If you need a refill on your cardiac medications before your next appointment, please call your pharmacy.  Remote monitoring is used to monitor your Pacemaker from home. This monitoring reduces the number of office visits required to check your device to one time per year. It allows [place] to monitor the functioning of your device to ensure it is working properly. You are scheduled for a device check from home on May 24, 2015. You may send your transmission at any time that day. If you have a wireless device, the transmission will be sent automatically. After your physician reviews your transmission, you will receive a postcard with your next transmission date.  Dr. Sallyanne Kuster recommends that you schedule a follow-up appointment in: [place]

## 2015-02-22 NOTE — Progress Notes (Signed)
Patient ID: [name], male   DOB: [birth year], 65 y.o.   MRN: [id number]    Cardiology Office Note    Date:  02/22/2015   ID:  Aaron, Snyder Mar 16, 1950, MRN [id number]  PCP:  Maximino Greenland, MD  Cardiologist:   Sanda Klein, MD   Chief Complaint  Patient presents with  . Follow-up    Post cath with SOB, chest pressure, occas. sharp pain and fatigue. Missed 3 days of Azilsartan-chlorthalidone (difficulty getting a refill authorized through his PCP).  Restarted last PM.    History of Present Illness:  Aaron Snyder is a 65 y.o. male who returns for follow-up after undergoing cardiac catheterization. He had described a reduction in exercise tolerance and his nuclear perfusion study appears to show a large, new inferior wall scar. Coronary angiography actually showed very minor coronary atherosclerotic lesions (max stenosis 25%) as well as normal left ventricular regional and global systolic function  He has almost 100% atrial paced, ventricular sensed rhythm. Interrogation of his pacemaker today shows clear evidence of chronotropic incompetence with a heart rate that is essentially stuck at the lower rate limit greater than 95% of the time. This is despite the fact that he exercises twice a day every day of the week (30 minutes of weights in the morning, 30-60 minutes of aerobic exercise in the evening).  He has well treated systemic hypertension, but his blood pressure today is a little high since he missed a few days of his antihypertensive (difficulty obtaining refills).  Coronary angiography has shown no progression of disease from 2005 to 2013 to 2016. Has a a history of head injury and cervical spine problems, GERD, pneumonia 2016.   Past Medical History  Diagnosis Date  . Coronary artery disease   . Hypertension   . Head injury   . Dysphagia   . Presence of permanent cardiac pacemaker 02/01/2003    Medtronic  . Pacemaker generator end of life 12/18/2011   Medtronic  . Syncope     Past Surgical History  Procedure Laterality Date  . Total shoulder arthroplasty    . Pacemaker insertion  2005    Medtronic  . Loop recorder implant/explant  2005  . Pacemaker generator change  12/18/2011    Medtronic  . Nm myocar perf wall motion  01/2011    Negative  . Esophagogastroduodenoscopy      [place]  . Left and right heart catheterization with coronary angiogram N/A 12/17/2011    Procedure: LEFT AND RIGHT HEART CATHETERIZATION WITH CORONARY ANGIOGRAM;  Surgeon: [name], MD;  Location: [facility] CATH LAB;  Service: Cardiovascular;  Laterality: N/A;  . Permanent pacemaker generator change N/A 12/18/2011    Procedure: PERMANENT PACEMAKER GENERATOR CHANGE;  Surgeon: [name], MD;  Location: [facility] CATH LAB;  Service: Cardiovascular;  Laterality: N/A;  . Cardiac catheterization N/A 12/26/2014    Procedure: Left Heart Cath and Coronary Angiography;  Surgeon: [name], MD;  Location: [place] CV LAB;  Service: Cardiovascular;  Laterality: N/A;    Outpatient Prescriptions Prior to Visit  Medication Sig Dispense Refill  . acetaminophen (TYLENOL) 325 MG tablet Take 1-2 tablets (325-650 mg total) by mouth every 4 (four) hours as needed. 2 tablet   . amLODipine (NORVASC) 10 MG tablet Take 10 mg by mouth daily.    . cetirizine (ZYRTEC) 10 MG tablet Take 10 mg by mouth daily.    . diazepam (VALIUM) 5 MG tablet Take 5 mg by mouth daily as  needed for anxiety.     . Multiple Vitamins-Minerals (MULTIVITAMIN WITH MINERALS) tablet Take 1 tablet by mouth daily.    . pantoprazole (PROTONIX) 40 MG tablet Take 1 tablet (40 mg total) by mouth daily. 30 tablet 11  . Azilsartan-Chlorthalidone (EDARBYCLOR) 40-25 MG TABS Take 1 tablet by mouth daily.     No facility-administered medications prior to visit.     Allergies:   Apple; Carrot; Iodinated diagnostic agents; Iohexol; Oat; Soy allergy; Wheat; Almond meal; and Cow's milk   Social History   Social History    . Marital Status: Widowed    Spouse Name: N/A  . Number of Children: N/A  . Years of Education: N/A   Occupational History  . Disabled    Social History Main Topics  . Smoking status: Former Smoker -- 1.00 packs/day for 15 years    Types: Cigarettes    Quit date: 03/15/1996  . Smokeless tobacco: Never Used  . Alcohol Use: No  . Drug Use: No  . Sexual Activity: Not Asked   Other Topics Concern  . None   Social History Narrative      ROS:   Please see the history of present illness.    ROS All other systems reviewed and are negative.   PHYSICAL EXAM:   VS:  BP 130/94 mmHg  Pulse 71  Ht 6' (1.829 m)  Wt 217 lb 3.2 oz (98.521 kg)  BMI 29.45 kg/m2   GEN: Well nourished, well developed, in no acute distress HEENT: normal Neck: no JVD, carotid bruits, or masses Cardiac: RRR; no murmurs, rubs, or gallops,no edema  Respiratory:  clear to auscultation bilaterally, normal work of breathing GI: soft, nontender, nondistended, + BS MS: no deformity or atrophy Skin: warm and dry, no rash Neuro:  Alert and Oriented x 3, Strength and sensation are intact Psych: euthymic mood, full affect  Wt Readings from Last 3 Encounters:  02/22/15 217 lb 3.2 oz (98.521 kg)  12/26/14 215 lb (97.523 kg)  12/25/14 220 lb (99.791 kg)      Studies/Labs Reviewed:   EKG:  EKG is not ordered today.    Recent Labs: 12/25/2014: ALT 30 12/26/2014: BUN 22*; Creatinine, Ser 1.64*; Hemoglobin 14.7; Platelets 214; Potassium 3.9; Sodium 140   Additional studies/ records that were reviewed today include:  Cardiac cath images, nuclear stress test images    ASSESSMENT:    1. Chronotropic incompetence with sinus node dysfunction (HCC)   2. Chest pain, unspecified chest pain type   3. Coronary artery disease involving native coronary artery of native heart without angina pectoris   4. Essential hypertension   5. Pacemaker - dual chamber Medtronic 2004, new generator 2013      PLAN:  In  order of problems listed above:  1. His reduced exercise tolerance and dyspnea may be related to chronotropic incompetence. Several changes are made to the sensor settings today. ADL was increased to 4, activity was increased to 4, upper rate limit was increased to 135 bpm. 2. In the past chest pain has led to angiography on 3 different occasions, most recently December 2016, without any evidence of progression of coronary disease 3. Has mild coronary risk process without significant obstructive lesions. 4. Control blood pressure typically, has missed a few days of his antihypertensive. Medication was refilled and no changes are made. 5. Normally functioning dual-chamber device. Changes as described above. Valerate heart rate histograms and symptoms in about 3 months.    Medication Adjustments/Labs and Tests Ordered:  Current medicines are reviewed at length with the patient today.  Concerns regarding medicines are outlined above.  Medication changes, Labs and Tests ordered today are listed in the Patient Instructions below. Patient Instructions  Your physician has recommended you make the following change in your medication: continue current medications  If you need a refill on your cardiac medications before your next appointment, please call your pharmacy.  Remote monitoring is used to monitor your Pacemaker from home. This monitoring reduces the number of office visits required to check your device to one time per year. It allows [place] to monitor the functioning of your device to ensure it is working properly. You are scheduled for a device check from home on May 24, 2015. You may send your transmission at any time that day. If you have a wireless device, the transmission will be sent automatically. After your physician reviews your transmission, you will receive a postcard with your next transmission date.  Dr. Sallyanne Kuster recommends that you schedule a follow-up appointment in: ONE YEAR           SignedSanda Klein, MD  02/22/2015 10:33 AM    [address], [place], [place]  [id number] Phone: [phone]; Fax: [phone]

## 2015-02-26 NOTE — Addendum Note (Signed)
Encounter addended by: Arliss Journey on: 02/26/2015  2:36 PM<BR>     Documentation filed: PRL Based Order Sets

## 2015-03-06 ENCOUNTER — Encounter: Payer: Medicare HMO | Admitting: *Deleted

## 2015-03-06 ENCOUNTER — Telehealth: Payer: Self-pay | Admitting: Cardiology

## 2015-03-06 NOTE — Telephone Encounter (Signed)
LMOVM reminding pt to send remote transmission.   

## 2015-03-08 ENCOUNTER — Encounter: Payer: Self-pay | Admitting: Cardiology

## 2015-03-20 ENCOUNTER — Telehealth: Payer: Self-pay | Admitting: Cardiovascular Disease

## 2015-03-20 NOTE — Telephone Encounter (Signed)
New Message  Pt calling to speak w/ Device- received letter about No show on 03/06/15, but pt is sched for remote on 05/24/15. Pt wanted to see if had to do anything else besides the remote in April. Please call back and discuss.

## 2015-03-20 NOTE — Telephone Encounter (Signed)
Returned patient's call. Advised him to disregard letter about 2/8 remote- had to reschedule due to the pt seeing Dr. Loletha Grayer in January. Rescheduled for 4/28. He reports that he is having trouble updating the application on his kindle for [employer]- advised to uninstall and then reinstall the application for update. He is agreeable.

## 2015-03-26 LAB — CUP PACEART INCLINIC DEVICE CHECK
Implantable Lead Implant Date: 20131122
Implantable Lead Location: 753859
Implantable Lead Model: 5092
MDC IDC LEAD IMPLANT DT: 20050106
MDC IDC LEAD LOCATION: 753860
MDC IDC SESS DTM: 20170228095006

## 2015-03-28 ENCOUNTER — Encounter: Payer: Self-pay | Admitting: Cardiovascular Disease

## 2015-05-24 ENCOUNTER — Ambulatory Visit (INDEPENDENT_AMBULATORY_CARE_PROVIDER_SITE_OTHER): Payer: Medicare HMO | Admitting: *Deleted

## 2015-05-24 ENCOUNTER — Telehealth: Payer: Self-pay | Admitting: Cardiology

## 2015-05-24 DIAGNOSIS — I495 Sick sinus syndrome: Secondary | ICD-10-CM

## 2015-05-24 NOTE — Progress Notes (Signed)
[employer] Summary Report / Loop Recorder 

## 2015-05-24 NOTE — Telephone Encounter (Signed)
LMOVM reminding pt to send remote transmission.   

## 2015-07-03 ENCOUNTER — Encounter: Payer: Self-pay | Admitting: Cardiology

## 2015-07-03 LAB — CUP PACEART REMOTE DEVICE CHECK
Battery Impedance: 208 Ohm
Battery Remaining Longevity: 130 mo
Battery Voltage: 2.79 V
Brady Statistic AP VS Percent: 92 %
Implantable Lead Implant Date: 20131122
Implantable Lead Location: 753860
Implantable Lead Model: 5092
Lead Channel Impedance Value: 425 Ohm
Lead Channel Impedance Value: 707 Ohm
Lead Channel Pacing Threshold Amplitude: 0.5 V
Lead Channel Setting Pacing Amplitude: 1.5 V
Lead Channel Setting Pacing Amplitude: 2 V
MDC IDC LEAD IMPLANT DT: 20050106
MDC IDC LEAD LOCATION: 753859
MDC IDC MSMT LEADCHNL RA PACING THRESHOLD PULSEWIDTH: 0.4 ms
MDC IDC MSMT LEADCHNL RV PACING THRESHOLD AMPLITUDE: 0.875 V
MDC IDC MSMT LEADCHNL RV PACING THRESHOLD PULSEWIDTH: 0.4 ms
MDC IDC MSMT LEADCHNL RV SENSING INTR AMPL: 8 mV
MDC IDC SESS DTM: 20170428152401
MDC IDC SET LEADCHNL RV PACING PULSEWIDTH: 0.4 ms
MDC IDC SET LEADCHNL RV SENSING SENSITIVITY: 4 mV
MDC IDC STAT BRADY AP VP PERCENT: 0 %
MDC IDC STAT BRADY AS VP PERCENT: 0 %
MDC IDC STAT BRADY AS VS PERCENT: 8 %

## 2015-07-10 DIAGNOSIS — Z79899 Other long term (current) drug therapy: Secondary | ICD-10-CM | POA: Diagnosis not present

## 2015-07-10 DIAGNOSIS — N182 Chronic kidney disease, stage 2 (mild): Secondary | ICD-10-CM | POA: Diagnosis not present

## 2015-07-10 DIAGNOSIS — G43119 Migraine with aura, intractable, without status migrainosus: Secondary | ICD-10-CM | POA: Diagnosis not present

## 2015-07-10 DIAGNOSIS — I129 Hypertensive chronic kidney disease with stage 1 through stage 4 chronic kidney disease, or unspecified chronic kidney disease: Secondary | ICD-10-CM | POA: Diagnosis not present

## 2015-07-10 DIAGNOSIS — R7309 Other abnormal glucose: Secondary | ICD-10-CM | POA: Diagnosis not present

## 2015-07-10 DIAGNOSIS — S90111A Contusion of right great toe without damage to nail, initial encounter: Secondary | ICD-10-CM | POA: Diagnosis not present

## 2015-08-26 ENCOUNTER — Telehealth: Payer: Self-pay | Admitting: Cardiology

## 2015-08-26 ENCOUNTER — Encounter: Payer: Medicare HMO | Admitting: *Deleted

## 2015-08-26 NOTE — Telephone Encounter (Signed)
Spoke with pt and reminded pt of remote transmission that is due today. Pt verbalized understanding.   

## 2015-08-27 ENCOUNTER — Encounter: Payer: Self-pay | Admitting: Physician Assistant

## 2015-08-28 ENCOUNTER — Ambulatory Visit (INDEPENDENT_AMBULATORY_CARE_PROVIDER_SITE_OTHER): Payer: Medicare HMO | Admitting: Physician Assistant

## 2015-08-28 ENCOUNTER — Encounter: Payer: Self-pay | Admitting: Physician Assistant

## 2015-08-28 VITALS — BP 116/72 | HR 72 | Ht 72.0 in | Wt 213.0 lb

## 2015-08-28 DIAGNOSIS — I10 Essential (primary) hypertension: Secondary | ICD-10-CM

## 2015-08-28 DIAGNOSIS — I495 Sick sinus syndrome: Secondary | ICD-10-CM | POA: Diagnosis not present

## 2015-08-28 MED ORDER — AZILSARTAN-CHLORTHALIDONE 40-25 MG PO TABS: 1.0000 | ORAL_TABLET | Freq: Every day | ORAL | 11 refills | Status: DC

## 2015-08-28 NOTE — Progress Notes (Signed)
Cardiology Office Note Date:  08/28/2015  Patient ID:  Aaron Snyder, Aaron Snyder 1950/05/24, MRN [id number] PCP:  Maximino Greenland, MD  Cardiologist:  Dr. Sallyanne Kuster   Chief Complaint:  F/u, continues to feel his exercise capacity is limited  History of Present Illness: Aaron Snyder is a 65 y.o. male with history of HTN, GERD, PPM, comes in to the office today to be seen for Dr. Sallyanne Kuster.  He was last seen by him in January, at that time, he had just undergone cardiac catheterization. He had described a reduction in exercise tolerance and his nuclear perfusion study appears to show a large, new inferior wall scar. Coronary angiography actually showed very minor coronary atherosclerotic lesions (max stenosis 25%) as well as normal left ventricular regional and global systolic function.  He was noted to have chronotropic incompetence and his sensor was adjusted at that visit in effort to improve his exertional capacity.  Today he reports feeling about the same, he is still having symptoms at peak exercise where he feels like he is just exhausted and weak even, sometimes even dizzy or lightheaded.  No syncope, no CP, no palpitations, no SOB. He exercises routinely often twice daily jogging 61miles i on average.  He reports no symptoms with his routine daily activities.  He reports on occasion he will also feel lightheaded upon standing, this is brief and fleeting.    Device information: MDT dual chamber PPM, gen change and new RV lead implant 12/18/11, RA lead/original device 02/01/2003, symptomatic bradycardia/pauses   Past Medical History:  Diagnosis Date  . Coronary artery disease   . Dysphagia   . Head injury   . Hypertension   . Pacemaker generator end of life 12/18/2011   Medtronic  . Presence of permanent cardiac pacemaker 02/01/2003   Medtronic  . Syncope     Past Surgical History:  Procedure Laterality Date  . CARDIAC CATHETERIZATION N/A 12/26/2014   Procedure: Left Heart Cath and  Coronary Angiography;  Surgeon: [name], MD;  Location: [place] CV LAB;  Service: Cardiovascular;  Laterality: N/A;  . ESOPHAGOGASTRODUODENOSCOPY     [place]  . LEFT AND RIGHT HEART CATHETERIZATION WITH CORONARY ANGIOGRAM N/A 12/17/2011   Procedure: LEFT AND RIGHT HEART CATHETERIZATION WITH CORONARY ANGIOGRAM;  Surgeon: [name], MD;  Location: [facility] CATH LAB;  Service: Cardiovascular;  Laterality: N/A;  . LOOP RECORDER IMPLANT/EXPLANT  2005  . NM MYOCAR PERF WALL MOTION  01/2011   Negative  . PACEMAKER GENERATOR CHANGE  12/18/2011   Medtronic  . PACEMAKER INSERTION  2005   Medtronic  . PERMANENT PACEMAKER GENERATOR CHANGE N/A 12/18/2011   Procedure: PERMANENT PACEMAKER GENERATOR CHANGE;  Surgeon: [name], MD;  Location: [facility] CATH LAB;  Service: Cardiovascular;  Laterality: N/A;  . TOTAL SHOULDER ARTHROPLASTY      Current Outpatient Prescriptions  Medication Sig Dispense Refill  . acetaminophen (TYLENOL) 325 MG tablet Take 1-2 tablets (325-650 mg total) by mouth every 4 (four) hours as needed. 2 tablet   . amLODipine (NORVASC) 10 MG tablet Take 10 mg by mouth daily.    . cetirizine (ZYRTEC) 10 MG tablet Take 10 mg by mouth daily.    . diazepam (VALIUM) 5 MG tablet Take 5 mg by mouth daily as needed for anxiety.     . Multiple Vitamins-Minerals (MULTIVITAMIN WITH MINERALS) tablet Take 1 tablet by mouth daily.    . Azilsartan-Chlorthalidone (EDARBYCLOR) 40-25 MG TABS Take 1 tablet by mouth daily. 30 tablet 11  No current facility-administered medications for this visit.     Allergies:   Apple; Carrot [daucus carota]; Iodinated diagnostic agents; Iohexol; Oat; Soy allergy; Wheat; Almond meal; and Cow's milk [lac bovis]   Social History:  The patient  reports that he quit smoking about 19 years ago. His smoking use included Cigarettes. He has a 15.00 pack-year smoking history. He has never used smokeless tobacco. He reports that he does not drink alcohol or use drugs.    Family History:  The patient's family history includes Diabetes in his father; Hypertension in his father.  ROS:  Please see the history of present illness.    All other systems are reviewed and otherwise negative.   PHYSICAL EXAM:  VS:  BP 116/72 (BP Location: Left Arm, Patient Position: Sitting, Cuff Size: Normal)   Pulse 72   Ht 6' (1.829 m)   Wt 213 lb (96.6 kg)   BMI 28.89 kg/m  BMI: Body mass index is 28.89 kg/m. Well nourished, well developed, in no acute distress  HEENT: normocephalic, atraumatic  Neck: no JVD, carotid bruits or masses Cardiac:  RRR; no significant murmurs, no rubs, or gallops Lungs:  clear to auscultation bilaterally, no wheezing, rhonchi or rales  Abd: soft, nontender MS: no deformity or atrophy Ext: no edema  Skin: warm and dry, no rash Neuro:  No gross deficits appreciated Psych: euthymic mood, full affect  PPM site is stable, no tethering or discomfort   EKG:  Done today shows A pace, V sense PPM check today: normal device function, battery status is good, HR histogram remains flat, in review with industry, recommended/reprogrammed ADL response from 4 > 5, upper tracking rate is at 130  12/26/14: LHC Conclusion   Ost LM lesion, 20% stenosed.  Prox RCA lesion, 20% stenosed.  There is hyperdynamic left ventricular systolic function.   Hyperdynamic LV function with more pronounced hyperkinesis at the mid ventricular level; EF> 65%. No significant coronary obstructive disease with smooth 20% ostial tapering of the left main; normal LAD and left circumflex coronary artery; and dominant RCA with smooth 20% proximal stenosis. RECOMMENDATION: Medical therapy.  Probable artifactual nuclear inferior defect.   12/05/13: Echocardiogram Impressions: - Normal biventricular size and systolic function. Mild concentric LVH. Abnormal relaxation with normal filling pressures. Normal RVSP. No significant valvular abnormality.  Recent  Labs: 12/25/2014: ALT 30 12/26/2014: BUN 22; Creatinine, Ser 1.64; Hemoglobin 14.7; Platelets 214; Potassium 3.9; Sodium 140  No results found for requested labs within last 8760 hours.   CrCl cannot be calculated (Patient's most recent lab result is older than the maximum 21 days allowed.).   Wt Readings from Last 3 Encounters:  08/28/15 213 lb (96.6 kg)  02/22/15 217 lb 3.2 oz (98.5 kg)  12/26/14 215 lb (97.5 kg)     Other studies reviewed: Additional studies/records reviewed today include: summarized above  ASSESSMENT AND PLAN:  1. PPM, chronotropic incompetence     C/w exercise intolerance especially toward peak exercise     ADL response increased 4 > 5, he walked in the hall a couple laps and reached HR 110 feeling OK  2. HTN     Stable  3. Described mild orthostatic symptoms     No significant orthostatic change in his BP noted today, no symptoms     Discussed adequate hydration  4. CRI, stage III      He reports his PMD has done labs sine Nov and told him his kidney function looked improved  Disposition: Monitor his  response to change in programming, see Dr. Loletha Grayer. In 2 months sooner if needed.  Current medicines are reviewed at length with the patient today.  The patient did not have any concerns regarding medicines.  Haywood Lasso, PA-C 08/28/2015 4:28 PM     [address] [place] [id number] [phone] (office)  [phone] (fax)

## 2015-08-28 NOTE — Patient Instructions (Addendum)
Medication Instructions:   Your physician recommends that you continue on your current medications as directed. Please refer to the Current Medication list given to you today.    If you need a refill on your cardiac medications before your next appointment, please call your pharmacy.  Labwork: NONE ORDER TODAY    Testing/Procedures:  NONE ORDER TODAY    Follow-Up:   WITH DR C IN TWO MONTHS AT [facility]    Any Other Special Instructions Will Be Listed Below (If Applicable).  YOU HAVE BEEN RECCOMMENDED TO STAY HYDRATED

## 2015-08-30 ENCOUNTER — Encounter: Payer: Self-pay | Admitting: Cardiology

## 2015-08-30 ENCOUNTER — Encounter: Payer: Self-pay | Admitting: Internal Medicine

## 2015-09-08 DIAGNOSIS — J22 Unspecified acute lower respiratory infection: Secondary | ICD-10-CM | POA: Diagnosis not present

## 2015-09-08 DIAGNOSIS — R0602 Shortness of breath: Secondary | ICD-10-CM | POA: Diagnosis not present

## 2015-09-13 ENCOUNTER — Encounter: Payer: Self-pay | Admitting: Physician Assistant

## 2015-09-15 NOTE — Progress Notes (Signed)
Cardiology Office Note Date:  09/16/2015  Patient ID:  Aaron Snyder 1950/09/01, MRN [id number] PCP:  Maximino Greenland, MD  Cardiologist:  Dr. Sallyanne Kuster   Chief Complaint:  F/u on reprogramming done last visit  History of Present Illness: Aaron Snyder is a 65 y.o. male with history of HTN, GERD, PPM, comes in to the office today to be seen for Dr. Sallyanne Kuster.  He was last seen by him in January, at that time, he had just undergone cardiac catheterization. He had described a reduction in exercise tolerance and his nuclear perfusion study appears to show a large, new inferior wall scar. Coronary angiography actually showed very minor coronary atherosclerotic lesions (max stenosis 25%) as well as normal left ventricular regional and global systolic function.  He was noted to have chronotropic incompetence and his sensor was adjusted at that visit in effort to improve his exertional capacity.  He was seen by myself 08/28/15 with continued exercise intolerances and he reported then feeling about the same, he was still having symptoms at peak exercise where he felt like he is just exhausted and weak even, sometimes even dizzy or lightheaded.  No syncope, no CP, no palpitations, no SOB. He exercises routinely often twice daily jogging 46miles on average.  He reports no symptoms with his routine daily activities.  He reports on occasion he will also feel lightheaded upon standing, this is brief and fleeting.  At that visit his ADL response increased 4 > 5, he walked in the hall a couple laps and reached HR 110 was feeling OK   He comes in today with reports of no significant change, in-fact he reports he thinks maybe feels that he was tiring a little sooner then usual though was also  being treated for a dental infection during the time in-between our visits and thinks that was making him feel a little less energetic then usual.  Device information: MDT dual chamber PPM, gen change and new RV lead  implant 12/18/11, RA lead/original device 02/01/2003, symptomatic bradycardia/pauses   Past Medical History:  Diagnosis Date  . Coronary artery disease   . Dysphagia   . Head injury   . Hypertension   . Pacemaker generator end of life 12/18/2011   Medtronic  . Presence of permanent cardiac pacemaker 02/01/2003   Medtronic  . Syncope     Past Surgical History:  Procedure Laterality Date  . CARDIAC CATHETERIZATION N/A 12/26/2014   Procedure: Left Heart Cath and Coronary Angiography;  Surgeon: [name], MD;  Location: [facility];  Service: Cardiovascular;  Laterality: N/A;  . ESOPHAGOGASTRODUODENOSCOPY     [place]  . LEFT AND RIGHT HEART CATHETERIZATION WITH CORONARY ANGIOGRAM N/A 12/17/2011   Procedure: LEFT AND RIGHT HEART CATHETERIZATION WITH CORONARY ANGIOGRAM;  Surgeon: [name], MD;  Location: [facility];  Service: Cardiovascular;  Laterality: N/A;  . LOOP RECORDER IMPLANT/EXPLANT  2005  . NM MYOCAR PERF WALL MOTION  01/2011   Negative  . PACEMAKER GENERATOR CHANGE  12/18/2011   Medtronic  . PACEMAKER INSERTION  2005   Medtronic  . PERMANENT PACEMAKER GENERATOR CHANGE N/A 12/18/2011   Procedure: PERMANENT PACEMAKER GENERATOR CHANGE;  Surgeon: [name], MD;  Location: [facility] CATH LAB;  Service: Cardiovascular;  Laterality: N/A;  . TOTAL SHOULDER ARTHROPLASTY      Current Outpatient Prescriptions  Medication Sig Dispense Refill  . acetaminophen (TYLENOL) 325 MG tablet Take 1-2 tablets (325-650 mg total) by mouth every 4 (four)  hours as needed. 2 tablet   . amLODipine (NORVASC) 10 MG tablet Take 10 mg by mouth daily.    [name] amoxicillin-clavulanate (AUGMENTIN) 875-125 MG tablet Take by mouth 2 (two) times daily.    . Azilsartan-Chlorthalidone (EDARBYCLOR) 40-25 MG TABS Take 1 tablet by mouth daily. 30 tablet 11  . cetirizine (ZYRTEC) 10 MG tablet Take 10 mg by mouth daily.    . diazepam (VALIUM) 5 MG tablet Take 5 mg by mouth daily as needed for anxiety.     .  Multiple Vitamins-Minerals (MULTIVITAMIN WITH MINERALS) tablet Take 1 tablet by mouth daily.     No current facility-administered medications for this visit.     Allergies:   Apple; Carrot [daucus carota]; Iodinated diagnostic agents; Iohexol; Oat; Soy allergy; Wheat; Almond meal; and Cow's milk [lac bovis]   Social History:  The patient  reports that he quit smoking about 19 years ago. His smoking use included Cigarettes. He has a 15.00 pack-year smoking history. He has never used smokeless tobacco. He reports that he does not drink alcohol or use drugs.   Family History:  The patient's family history includes Diabetes in his father; Hypertension in his father.  ROS:  Please see the history of present illness.    All other systems are reviewed and otherwise negative.   PHYSICAL EXAM:  VS:  BP 108/72   Pulse 78   Ht 6' (1.829 m)   Wt 214 lb (97.1 kg)   BMI 29.02 kg/m  BMI: Body mass index is 29.02 kg/m. Well nourished, well developed, in no acute distress  HEENT: normocephalic, atraumatic  Neck: no JVD, carotid bruits or masses Cardiac:  RRR; no significant murmurs, no rubs, or gallops Lungs:  clear to auscultation bilaterally, no wheezing, rhonchi or rales  Abd: soft, nontender MS: no deformity or atrophy Ext: no edema  Skin: warm and dry, no rash Neuro:  No gross deficits appreciated Psych: euthymic mood, full affect  PPM site is stable, no tethering or discomfort   EKG:  Done 08/28/15 A pace, V sense PPM check today: normal device function, battery status is good, HR histogram remains generally stuck 70-80bpm, device functioning as programmed, lead/batery status good   12/26/14: LHC Conclusion   Ost LM lesion, 20% stenosed.  Prox RCA lesion, 20% stenosed.  There is hyperdynamic left ventricular systolic function.   Hyperdynamic LV function with more pronounced hyperkinesis at the mid ventricular level; EF> 65%. No significant coronary obstructive disease with  smooth 20% ostial tapering of the left main; normal LAD and left circumflex coronary artery; and dominant RCA with smooth 20% proximal stenosis. RECOMMENDATION: Medical therapy.  Probable artifactual nuclear inferior defect.   12/05/13: Echocardiogram Impressions: - Normal biventricular size and systolic function. Mild concentric LVH. Abnormal relaxation with normal filling pressures. Normal RVSP. No significant valvular abnormality.  Recent Labs: 12/25/2014: ALT 30 12/26/2014: BUN 22; Creatinine, Ser 1.64; Hemoglobin 14.7; Platelets 214; Potassium 3.9; Sodium 140  No results found for requested labs within last 8760 hours.   CrCl cannot be calculated (Patient's most recent lab result is older than the maximum 21 days allowed.).   Wt Readings from Last 3 Encounters:  09/16/15 214 lb (97.1 kg)  08/28/15 213 lb (96.6 kg)  02/22/15 217 lb 3.2 oz (98.5 kg)     Other studies reviewed: Additional studies/records reviewed today include: summarized above  ASSESSMENT AND PLAN:  1. PPM, chronotropic incompetence, hx of symptomatic bradycardia and pauses     C/w exercise intolerance he  reports brisk walks/slow jogs twice daily, or gym and fast walks as alternative,     Device further reprogrammed to lower rate of 60, and ADL rate from 100-105, upper sensor rate from 130 to 150, activity threshold is already at low  2. HTN     Stable  3. Described mild orthostatic symptoms     No significant orthostatic change in his BP noted at his last visit, no symptoms     Discussed adequate hydration  4. CRI, stage III      He reported his PMD has done labs sine Nov and told him his kidney function looked improved   Pacer reprogrammed as discussed  Disposition: He has an appt with Dr. Loletha Grayer in early October, will keep this, or sooner if needed.  Current medicines are reviewed at length with the patient today.  The patient did not have any concerns regarding medicines.  Aaron Lasso,  PA-C 09/16/2015 9:20 AM     [facility] [address] [place] [address] [phone] (office)  [phone] (fax)

## 2015-09-16 ENCOUNTER — Encounter: Payer: Self-pay | Admitting: Physician Assistant

## 2015-09-16 ENCOUNTER — Ambulatory Visit (INDEPENDENT_AMBULATORY_CARE_PROVIDER_SITE_OTHER): Payer: Medicare HMO | Admitting: Physician Assistant

## 2015-09-16 VITALS — BP 108/72 | HR 78 | Ht 72.0 in | Wt 214.0 lb

## 2015-09-16 DIAGNOSIS — I4589 Other specified conduction disorders: Secondary | ICD-10-CM

## 2015-09-16 DIAGNOSIS — R001 Bradycardia, unspecified: Secondary | ICD-10-CM

## 2015-09-16 DIAGNOSIS — I10 Essential (primary) hypertension: Secondary | ICD-10-CM

## 2015-09-16 DIAGNOSIS — I495 Sick sinus syndrome: Secondary | ICD-10-CM | POA: Diagnosis not present

## 2015-09-16 NOTE — Patient Instructions (Addendum)
Medication Instructions:   .Your physician recommends that you continue on your current medications as directed. Please refer to the Current Medication list given to you today.    If you need a refill on your cardiac medications before your next appointment, please call your pharmacy.  Labwork: NONE ORDER TODAY    Testing/Procedures: NONE ORDER TODAY    Follow-Up: KEEP APPOINTMENT WITH DR C.. AS SCHEDULED  Remote monitoring is used to monitor your Pacemaker of ICD from home. This monitoring reduces the number of office visits required to check your device to one time per year. It allows [place] to keep an eye on the functioning of your device to ensure it is working properly. You are scheduled for a device check from home on . 12.22/217.You may send your transmission at any time that day. If you have a wireless device, the transmission will be sent automatically. After your physician reviews your transmission, you will receive a postcard with your next transmission date.   Any Other Special Instructions Will Be Listed Below (If Applicable).

## 2015-09-17 DIAGNOSIS — J22 Unspecified acute lower respiratory infection: Secondary | ICD-10-CM | POA: Diagnosis not present

## 2015-09-17 DIAGNOSIS — R0602 Shortness of breath: Secondary | ICD-10-CM | POA: Diagnosis not present

## 2015-09-18 NOTE — Progress Notes (Signed)
Thank you :)

## 2015-10-01 DIAGNOSIS — M25511 Pain in right shoulder: Secondary | ICD-10-CM | POA: Diagnosis not present

## 2015-10-01 DIAGNOSIS — M5442 Lumbago with sciatica, left side: Secondary | ICD-10-CM | POA: Diagnosis not present

## 2015-10-28 ENCOUNTER — Encounter: Payer: Self-pay | Admitting: Cardiovascular Disease

## 2015-10-28 ENCOUNTER — Ambulatory Visit (INDEPENDENT_AMBULATORY_CARE_PROVIDER_SITE_OTHER): Payer: Medicare HMO | Admitting: Cardiovascular Disease

## 2015-10-28 VITALS — BP 151/85 | HR 107 | Ht 71.0 in | Wt 215.4 lb

## 2015-10-28 DIAGNOSIS — I495 Sick sinus syndrome: Secondary | ICD-10-CM

## 2015-10-28 DIAGNOSIS — I2510 Atherosclerotic heart disease of native coronary artery without angina pectoris: Secondary | ICD-10-CM | POA: Diagnosis not present

## 2015-10-28 DIAGNOSIS — Z950 Presence of cardiac pacemaker: Secondary | ICD-10-CM | POA: Diagnosis not present

## 2015-10-28 DIAGNOSIS — R0602 Shortness of breath: Secondary | ICD-10-CM | POA: Diagnosis not present

## 2015-10-28 DIAGNOSIS — I10 Essential (primary) hypertension: Secondary | ICD-10-CM

## 2015-10-28 NOTE — Patient Instructions (Addendum)
Medication Instructions: Dr Sallyanne Kuster recommends that you continue on your current medications as directed. Please refer to the Current Medication list given to you today.  Labwork: NONE ORDERED  Testing/Procedures: 1. Exercise Tolerance Test (GXT) in 2 weeks - Your physician has requested that you have an exercise tolerance test. For further information please visit [email or web address]. Please also follow instruction sheet, as given.  Follow-up: Dr Sallyanne Kuster recommends that you schedule a follow-up appointment in 3 months with a pacemaker check.  If you need a refill on your cardiac medications before your next appointment, please call your pharmacy.

## 2015-10-28 NOTE — Progress Notes (Signed)
Patient ID: Aaron Snyder, male   DOB: 1950/03/30, 65 y.o.   MRN: [id number]    Cardiology Office Note    Date:  10/28/2015   ID:  Aaron Snyder, Aaron Snyder 01, 1952, MRN [id number]  PCP:  Aaron Greenland, MD  Cardiologist:   Aaron Klein, MD   Chief Complaint  Patient presents with  . Follow-up    sob; when exercising. dizziness; when exercising. cramping in legs frequently.     History of Present Illness:  Aaron Snyder is a 65 y.o. male who returns for follow-up after Adjustments to his pacemaker settings.   He has noticed a reduction in exercise tolerance and his nuclear perfusion study appears to show a large, new inferior wall scar. Coronary angiography actually showed very minor coronary atherosclerotic lesions (max stenosis 25%) as well as normal left ventricular regional and global systolic function. We have gradually adjusted his rate response sensor settings to the point that they're fairly aggressive (ADL 5, exercise 5, threshold low). His noticed partial improvement. He reports that "we are getting better". However he still has some difficulty with exercise ability. He is a former athlete and exercises 3 times a day, 7 days a week. He typically will walk on the treadmill at 3.7 miles per hour in the morning then walk in the park at roughly 3 miles an hour in the afternoon and then exercise using weight equipment in the evening. He has stopped using the bicycle at my suggestion, since this was not probably stimulating his accelerometer enough. He still uses the elliptical. His biggest complaint is that he never gets his "second wind" as he was accustomed before. Recently he has slow down the pace of walking on the treadmill due to some problems with back pain (down to 3.2 miles an hour).  He has almost 90% atrial paced, ventricular sensed rhythm. Interrogation of his pacemaker today shows improved heart rate responsiveness, no longer "stuck" at lower rate limit. Otherwise  normal pacemaker function by comprehensive check today. Lead parameters are excellent. Battery voltage suggest 10.5 years of remaining longevity. Device was implanted November 2013. There've been no episodes of atrial fibrillation or high ventricular response.  He has well treated systemic hypertension. His most recent labs did not show evidence of anemia. He is due a full physical with Dr. Baird Cancer in December. He does not have symptoms of thyroid disease. He has mild chronic kidney disease with a creatinine of about 1.6 (GFR around 50). Coronary angiography has shown no progression of disease from 2005 to 2013 to 2016. Has a a history of head injury and cervical spine problems, GERD, pneumonia 2016.     Past Medical History:  Diagnosis Date  . Coronary artery disease   . Dysphagia   . Head injury   . Hypertension   . Pacemaker generator end of life 12/18/2011   Medtronic  . Presence of permanent cardiac pacemaker 02/01/2003   Medtronic  . Syncope     Past Surgical History:  Procedure Laterality Date  . CARDIAC CATHETERIZATION N/A 12/26/2014   Procedure: Left Heart Cath and Coronary Angiography;  Surgeon: [name], MD;  Location: [facility];  Service: Cardiovascular;  Laterality: N/A;  . ESOPHAGOGASTRODUODENOSCOPY     [place]  . LEFT AND RIGHT HEART CATHETERIZATION WITH CORONARY ANGIOGRAM N/A 12/17/2011   Procedure: LEFT AND RIGHT HEART CATHETERIZATION WITH CORONARY ANGIOGRAM;  Surgeon: [name], MD;  Location: [facility] CATH LAB;  Service: Cardiovascular;  Laterality: N/A;  .  LOOP RECORDER IMPLANT/EXPLANT  2005  . NM MYOCAR PERF WALL MOTION  01/2011   Negative  . PACEMAKER GENERATOR CHANGE  12/18/2011   Medtronic  . PACEMAKER INSERTION  2005   Medtronic  . PERMANENT PACEMAKER GENERATOR CHANGE N/A 12/18/2011   Procedure: PERMANENT PACEMAKER GENERATOR CHANGE;  Surgeon: [name], MD;  Location: [facility] CATH LAB;  Service: Cardiovascular;  Laterality: N/A;  . TOTAL SHOULDER  ARTHROPLASTY      Outpatient Medications Prior to Visit  Medication Sig Dispense Refill  . amLODipine (NORVASC) 10 MG tablet Take 10 mg by mouth daily.    . Azilsartan-Chlorthalidone (EDARBYCLOR) 40-25 MG TABS Take 1 tablet by mouth daily. 30 tablet 11  . cetirizine (ZYRTEC) 10 MG tablet Take 10 mg by mouth daily.    . diazepam (VALIUM) 5 MG tablet Take 5 mg by mouth daily as needed for anxiety.     . Multiple Vitamins-Minerals (MULTIVITAMIN WITH MINERALS) tablet Take 1 tablet by mouth daily.    [name] acetaminophen (TYLENOL) 325 MG tablet Take 1-2 tablets (325-650 mg total) by mouth every 4 (four) hours as needed. 2 tablet   . amoxicillin-clavulanate (AUGMENTIN) 875-125 MG tablet Take by mouth 2 (two) times daily.     No facility-administered medications prior to visit.      Allergies:   Apple; Carrot [daucus carota]; Iodinated diagnostic agents; Iohexol; Oat; Soy allergy; Wheat; Almond meal; and Cow's milk [lac bovis]   Social History   Social History  . Marital status: Widowed    Spouse name: N/A  . Number of children: N/A  . Years of education: N/A   Occupational History  . Disabled Unemployed   Social History Main Topics  . Smoking status: Former Smoker    Packs/day: 1.00    Years: 15.00    Types: Cigarettes    Quit date: 03/15/1996  . Smokeless tobacco: Never Used  . Alcohol use No  . Drug use: No  . Sexual activity: Not Asked   Other Topics Concern  . None   Social History Narrative  . None      ROS:   Please see the history of present illness.    ROS All other systems reviewed and are negative.   PHYSICAL EXAM:   VS:  BP (!) 151/85   Pulse (!) 107   Ht 5\' 11"  (1.803 m)   Wt 97.7 kg (215 lb 6.4 oz)   BMI 30.04 kg/m    GEN: Well nourished, well developed, in no acute distress  HEENT: normal  Neck: no JVD, carotid bruits, or masses Cardiac: RRR; no murmurs, rubs, or gallops,no edema  Respiratory:  clear to auscultation bilaterally, normal work of  breathing GI: soft, nontender, nondistended, + BS MS: no deformity or atrophy  Skin: warm and dry, no rash Neuro:  Alert and Oriented x 3, Strength and sensation are intact Psych: euthymic mood, full affect  Wt Readings from Last 3 Encounters:  10/28/15 97.7 kg (215 lb 6.4 oz)  09/16/15 97.1 kg (214 lb)  08/28/15 96.6 kg (213 lb)      Studies/Labs Reviewed:   EKG:  EKG is not ordered today.    Recent Labs: 12/25/2014: ALT 30 12/26/2014: BUN 22; Creatinine, Ser 1.64; Hemoglobin 14.7; Platelets 214; Potassium 3.9; Sodium 140    ASSESSMENT:    1. Chronotropic incompetence with sinus node dysfunction (HCC)   2. Shortness of breath   3. Coronary artery disease involving native coronary artery of native heart without angina pectoris  4. Essential hypertension   5. Pacemaker      PLAN:  In order of problems listed above:  1. SSS: His reduced exercise tolerance and dyspnea may be related to chronotropic incompetence. Several changes have been made to the sensor settings In the past few months. Further adjustments of the sensor should be performed based on treadmill testing which we will schedule for later this month. Will give him some time to resolve his back pain.  2.  Dyspnea: We'll also need to evaluate for anemia and hypothyroidism, although clinically these are not immediately apparent 3. CAD:  Mild; chest pain has led to angiography on 3 different occasions, most recently December 2016, without any evidence of progression of coronary disease and without significant obstructive lesions. 4. HTN: Minimally elevated blood pressure today. No changes made. 5. PPM: Normally functioning dual-chamber device.    Medication Adjustments/Labs and Tests Ordered: Current medicines are reviewed at length with the patient today.  Concerns regarding medicines are outlined above.  Medication changes, Labs and Tests ordered today are listed in the Patient Instructions below. Patient  Instructions  Medication Instructions: Dr Sallyanne Kuster recommends that you continue on your current medications as directed. Please refer to the Current Medication list given to you today.  Labwork: NONE ORDERED  Testing/Procedures: 1. Exercise Tolerance Test (GXT) in 2 weeks - Your physician has requested that you have an exercise tolerance test. For further information please visit [email or web address]. Please also follow instruction sheet, as given.  Follow-up: Dr Sallyanne Kuster recommends that you schedule a follow-up appointment in 3 months with a pacemaker check.  If you need a refill on your cardiac medications before your next appointment, please call your pharmacy.      Signed, Aaron Klein, MD  10/28/2015 12:49 PM    [address], [place], [place]  [address] Phone: [phone]; Fax: [phone]

## 2015-10-30 LAB — CUP PACEART INCLINIC DEVICE CHECK
Battery Impedance: 256 Ohm
Battery Remaining Longevity: 124 mo
Battery Voltage: 2.79 V
Brady Statistic AP VP Percent: 0 %
Date Time Interrogation Session: 20171002110845
Implantable Lead Implant Date: 20131122
Implantable Lead Location: 753860
Lead Channel Impedance Value: 425 Ohm
Lead Channel Pacing Threshold Pulse Width: 0.4 ms
Lead Channel Setting Pacing Amplitude: 1.5 V
Lead Channel Setting Pacing Amplitude: 2 V
Lead Channel Setting Pacing Pulse Width: 0.4 ms
Lead Channel Setting Sensing Sensitivity: 4 mV
MDC IDC LEAD IMPLANT DT: 20050106
MDC IDC LEAD LOCATION: 753859
MDC IDC MSMT LEADCHNL RA PACING THRESHOLD AMPLITUDE: 0.5 V
MDC IDC MSMT LEADCHNL RA PACING THRESHOLD PULSEWIDTH: 0.4 ms
MDC IDC MSMT LEADCHNL RV IMPEDANCE VALUE: 786 Ohm
MDC IDC MSMT LEADCHNL RV PACING THRESHOLD AMPLITUDE: 0.875 V
MDC IDC MSMT LEADCHNL RV SENSING INTR AMPL: 8 mV
MDC IDC STAT BRADY AP VS PERCENT: 89 %
MDC IDC STAT BRADY AS VP PERCENT: 0 %
MDC IDC STAT BRADY AS VS PERCENT: 11 %

## 2015-11-15 ENCOUNTER — Telehealth (HOSPITAL_COMMUNITY): Payer: Self-pay

## 2015-11-15 NOTE — Telephone Encounter (Signed)
Encounter complete. 

## 2015-11-20 ENCOUNTER — Telehealth: Payer: Self-pay | Admitting: Cardiovascular Disease

## 2015-11-20 ENCOUNTER — Ambulatory Visit (HOSPITAL_COMMUNITY)
Admission: RE | Admit: 2015-11-20 | Discharge: 2015-11-20 | Disposition: A | Discharge status: Home / Self Care | Payer: Medicare HMO | Source: Ambulatory Visit | Attending: Cardiovascular Disease | Admitting: Cardiovascular Disease

## 2015-11-20 DIAGNOSIS — R0602 Shortness of breath: Secondary | ICD-10-CM

## 2015-11-20 LAB — EXERCISE TOLERANCE TEST
CSEPED: 13 min
CSEPHR: 96 %
CSEPPHR: 148 {beats}/min
Estimated workload: 7.3 METS
Exercise duration (sec): 32 s
MPHR: 154 {beats}/min
RPE: 16
Rest HR: 74 {beats}/min

## 2015-11-20 NOTE — Telephone Encounter (Signed)
New message ° ° ° ° °Returning a call to the nurse °

## 2015-11-21 ENCOUNTER — Encounter: Payer: Self-pay | Admitting: Cardiovascular Disease

## 2015-11-21 NOTE — Telephone Encounter (Signed)
Notes Recorded by Dionne Bucy Truitt, CMA on 11/21/2015 at 10:26 AM EDT Patient returned call. Results reviewed. FYI - Patient states that he takes his BP regularly at home and its lower than at his office visit. Patient will send his blood pressure readings prior initiating med medication.

## 2015-12-10 DIAGNOSIS — R69 Illness, unspecified: Secondary | ICD-10-CM | POA: Diagnosis not present

## 2016-01-15 DIAGNOSIS — N182 Chronic kidney disease, stage 2 (mild): Secondary | ICD-10-CM | POA: Diagnosis not present

## 2016-01-15 DIAGNOSIS — Z87891 Personal history of nicotine dependence: Secondary | ICD-10-CM | POA: Diagnosis not present

## 2016-01-15 DIAGNOSIS — I129 Hypertensive chronic kidney disease with stage 1 through stage 4 chronic kidney disease, or unspecified chronic kidney disease: Secondary | ICD-10-CM | POA: Diagnosis not present

## 2016-01-15 DIAGNOSIS — Z0000 Encounter for general adult medical examination without abnormal findings: Secondary | ICD-10-CM | POA: Diagnosis not present

## 2016-01-15 DIAGNOSIS — Z23 Encounter for immunization: Secondary | ICD-10-CM | POA: Diagnosis not present

## 2016-01-15 DIAGNOSIS — Z1211 Encounter for screening for malignant neoplasm of colon: Secondary | ICD-10-CM | POA: Diagnosis not present

## 2016-01-15 DIAGNOSIS — R7309 Other abnormal glucose: Secondary | ICD-10-CM | POA: Diagnosis not present

## 2016-01-15 DIAGNOSIS — E039 Hypothyroidism, unspecified: Secondary | ICD-10-CM | POA: Diagnosis not present

## 2016-01-17 ENCOUNTER — Telehealth: Payer: Self-pay | Admitting: Cardiology

## 2016-01-17 ENCOUNTER — Ambulatory Visit (INDEPENDENT_AMBULATORY_CARE_PROVIDER_SITE_OTHER): Payer: Medicare HMO | Admitting: *Deleted

## 2016-01-17 DIAGNOSIS — I495 Sick sinus syndrome: Secondary | ICD-10-CM

## 2016-01-17 NOTE — Progress Notes (Signed)
Remote pacemaker transmission.   

## 2016-01-17 NOTE — Telephone Encounter (Signed)
LMOVM reminding pt to send remote transmission.   

## 2016-01-22 ENCOUNTER — Encounter: Payer: Self-pay | Admitting: Cardiology

## 2016-01-24 LAB — CUP PACEART REMOTE DEVICE CHECK
Battery Impedance: 280 Ohm
Battery Remaining Longevity: 120 mo
Battery Voltage: 2.77 V
Brady Statistic AP VP Percent: 0 %
Brady Statistic AP VS Percent: 92 %
Brady Statistic AS VP Percent: 0 %
Implantable Lead Location: 753859
Implantable Lead Location: 753860
Implantable Lead Model: 5092
Implantable Lead Model: 5594
Implantable Pulse Generator Implant Date: 20131122
Lead Channel Impedance Value: 420 Ohm
Lead Channel Pacing Threshold Amplitude: 0.5 V
Lead Channel Pacing Threshold Pulse Width: 0.4 ms
Lead Channel Setting Pacing Amplitude: 2 V
Lead Channel Setting Pacing Pulse Width: 0.4 ms
MDC IDC LEAD IMPLANT DT: 20050106
MDC IDC LEAD IMPLANT DT: 20131122
MDC IDC MSMT LEADCHNL RV IMPEDANCE VALUE: 671 Ohm
MDC IDC MSMT LEADCHNL RV PACING THRESHOLD AMPLITUDE: 0.75 V
MDC IDC MSMT LEADCHNL RV PACING THRESHOLD PULSEWIDTH: 0.4 ms
MDC IDC SESS DTM: 20171222174050
MDC IDC SET LEADCHNL RA PACING AMPLITUDE: 1.5 V
MDC IDC SET LEADCHNL RV SENSING SENSITIVITY: 2.8 mV
MDC IDC STAT BRADY AS VS PERCENT: 8 %

## 2016-02-19 ENCOUNTER — Other Ambulatory Visit: Payer: Self-pay | Admitting: Cardiovascular Disease

## 2016-02-25 DIAGNOSIS — H40013 Open angle with borderline findings, low risk, bilateral: Secondary | ICD-10-CM | POA: Diagnosis not present

## 2016-02-25 DIAGNOSIS — H25813 Combined forms of age-related cataract, bilateral: Secondary | ICD-10-CM | POA: Diagnosis not present

## 2016-02-27 ENCOUNTER — Other Ambulatory Visit: Payer: Self-pay | Admitting: Cardiovascular Disease

## 2016-03-11 ENCOUNTER — Other Ambulatory Visit: Payer: Self-pay | Admitting: Nephrology

## 2016-03-11 DIAGNOSIS — N183 Chronic kidney disease, stage 3 unspecified: Secondary | ICD-10-CM

## 2016-03-11 DIAGNOSIS — I129 Hypertensive chronic kidney disease with stage 1 through stage 4 chronic kidney disease, or unspecified chronic kidney disease: Secondary | ICD-10-CM | POA: Diagnosis not present

## 2016-03-12 DIAGNOSIS — H268 Other specified cataract: Secondary | ICD-10-CM | POA: Diagnosis not present

## 2016-03-12 DIAGNOSIS — H2511 Age-related nuclear cataract, right eye: Secondary | ICD-10-CM | POA: Diagnosis not present

## 2016-03-12 DIAGNOSIS — H25011 Cortical age-related cataract, right eye: Secondary | ICD-10-CM | POA: Diagnosis not present

## 2016-03-16 ENCOUNTER — Other Ambulatory Visit: Payer: Medicare HMO

## 2016-03-17 DIAGNOSIS — E871 Hypo-osmolality and hyponatremia: Secondary | ICD-10-CM | POA: Diagnosis not present

## 2016-03-23 ENCOUNTER — Other Ambulatory Visit: Payer: Self-pay | Admitting: Cardiovascular Disease

## 2016-03-26 ENCOUNTER — Telehealth: Payer: Self-pay | Admitting: Cardiovascular Disease

## 2016-03-26 ENCOUNTER — Ambulatory Visit
Admission: RE | Admit: 2016-03-26 | Discharge: 2016-03-26 | Disposition: A | Discharge status: Home / Self Care | Payer: Medicare HMO | Source: Ambulatory Visit | Attending: Nephrology | Admitting: Nephrology

## 2016-03-26 DIAGNOSIS — N183 Chronic kidney disease, stage 3 unspecified: Secondary | ICD-10-CM

## 2016-03-26 NOTE — Telephone Encounter (Signed)
Aaron Snyder is calling because he is on Edardyclor and the cost has gone up from $25 dollars to $182 dollars . Wants to know can a generic be called in for him . (FYI: Will not be home until 2pm ) .

## 2016-03-27 NOTE — Telephone Encounter (Signed)
It might just be the beginning of year deductible, not a permanent increase. Can switch to irbesartan 300 mg daily + chlorthalidone 25 mg daily if not.

## 2016-04-07 ENCOUNTER — Telehealth: Payer: Self-pay | Admitting: Cardiovascular Disease

## 2016-04-07 NOTE — Telephone Encounter (Signed)
Sounds good to me

## 2016-04-07 NOTE — Telephone Encounter (Signed)
New Message    Pt doesn't know what you are calling him for?  He was waiting on a call from Dr Sallyanne Kuster from 03/26/16

## 2016-04-07 NOTE — Telephone Encounter (Signed)
See most recent telephone note.

## 2016-04-07 NOTE — Telephone Encounter (Signed)
lmtcb

## 2016-04-07 NOTE — Telephone Encounter (Signed)
Spoke to patient. Notes this was about the edarbyclor prescribed by Dr. Sallyanne Kuster. Patient informs me he also has a kidney specialist (Dr. Mercy Moore) who manages some of his prescriptions. He called there last week after calling us and notes Dr. Mercy Moore made changes --- discontinued the edarbyclor in favor of Candesartan-HCTZ 32-25mg  1 tablet daily.  Informed patient I would let Dr. Sallyanne Kuster know. Advised to continue to check BP 3 times daily as previously instructed, call if concerns.  Patient also notes as previously instructed, he plans to send remote PM interrogation at end of the month.

## 2016-04-16 DIAGNOSIS — I129 Hypertensive chronic kidney disease with stage 1 through stage 4 chronic kidney disease, or unspecified chronic kidney disease: Secondary | ICD-10-CM | POA: Diagnosis not present

## 2016-04-16 DIAGNOSIS — N183 Chronic kidney disease, stage 3 (moderate): Secondary | ICD-10-CM | POA: Diagnosis not present

## 2016-04-21 ENCOUNTER — Ambulatory Visit (INDEPENDENT_AMBULATORY_CARE_PROVIDER_SITE_OTHER): Payer: Medicare HMO | Admitting: *Deleted

## 2016-04-21 DIAGNOSIS — I495 Sick sinus syndrome: Secondary | ICD-10-CM

## 2016-04-22 LAB — CUP PACEART REMOTE DEVICE CHECK
Battery Remaining Longevity: 120 mo
Battery Voltage: 2.79 V
Brady Statistic AP VS Percent: 89 %
Brady Statistic AS VP Percent: 0 %
Date Time Interrogation Session: 20180327161655
Implantable Lead Implant Date: 20131122
Implantable Lead Location: 753859
Lead Channel Pacing Threshold Amplitude: 0.5 V
Lead Channel Pacing Threshold Amplitude: 0.875 V
Lead Channel Pacing Threshold Pulse Width: 0.4 ms
Lead Channel Pacing Threshold Pulse Width: 0.4 ms
Lead Channel Setting Pacing Amplitude: 1.5 V
Lead Channel Setting Pacing Pulse Width: 0.4 ms
MDC IDC LEAD IMPLANT DT: 20050106
MDC IDC LEAD LOCATION: 753860
MDC IDC MSMT BATTERY IMPEDANCE: 280 Ohm
MDC IDC MSMT LEADCHNL RA IMPEDANCE VALUE: 431 Ohm
MDC IDC MSMT LEADCHNL RV IMPEDANCE VALUE: 684 Ohm
MDC IDC PG IMPLANT DT: 20131122
MDC IDC SET LEADCHNL RV PACING AMPLITUDE: 2 V
MDC IDC SET LEADCHNL RV SENSING SENSITIVITY: 4 mV
MDC IDC STAT BRADY AP VP PERCENT: 0 %
MDC IDC STAT BRADY AS VS PERCENT: 11 %

## 2016-04-22 NOTE — Progress Notes (Signed)
Remote pacemaker transmission.   

## 2016-04-24 ENCOUNTER — Encounter: Payer: Self-pay | Admitting: Cardiology

## 2016-07-02 DIAGNOSIS — R5383 Other fatigue: Secondary | ICD-10-CM | POA: Diagnosis not present

## 2016-07-02 DIAGNOSIS — N182 Chronic kidney disease, stage 2 (mild): Secondary | ICD-10-CM | POA: Diagnosis not present

## 2016-07-02 DIAGNOSIS — I129 Hypertensive chronic kidney disease with stage 1 through stage 4 chronic kidney disease, or unspecified chronic kidney disease: Secondary | ICD-10-CM | POA: Diagnosis not present

## 2016-07-02 DIAGNOSIS — E039 Hypothyroidism, unspecified: Secondary | ICD-10-CM | POA: Diagnosis not present

## 2016-07-02 DIAGNOSIS — M545 Low back pain: Secondary | ICD-10-CM | POA: Diagnosis not present

## 2016-07-02 DIAGNOSIS — Z125 Encounter for screening for malignant neoplasm of prostate: Secondary | ICD-10-CM | POA: Diagnosis not present

## 2016-07-02 DIAGNOSIS — N183 Chronic kidney disease, stage 3 (moderate): Secondary | ICD-10-CM | POA: Diagnosis not present

## 2016-07-02 DIAGNOSIS — R7309 Other abnormal glucose: Secondary | ICD-10-CM | POA: Diagnosis not present

## 2016-07-21 ENCOUNTER — Telehealth: Payer: Self-pay | Admitting: Cardiology

## 2016-07-21 ENCOUNTER — Ambulatory Visit (INDEPENDENT_AMBULATORY_CARE_PROVIDER_SITE_OTHER): Payer: Medicare HMO | Admitting: *Deleted

## 2016-07-21 DIAGNOSIS — I495 Sick sinus syndrome: Secondary | ICD-10-CM

## 2016-07-21 NOTE — Progress Notes (Signed)
Remote pacemaker transmission.   

## 2016-07-21 NOTE — Telephone Encounter (Signed)
Spoke with pt and reminded pt of remote transmission that is due today. Pt verbalized understanding.   

## 2016-07-22 ENCOUNTER — Encounter: Payer: Self-pay | Admitting: Cardiology

## 2016-07-22 LAB — CUP PACEART REMOTE DEVICE CHECK
Battery Voltage: 2.79 V
Brady Statistic AP VS Percent: 89 %
Brady Statistic AS VS Percent: 11 %
Date Time Interrogation Session: 20180626160733
Implantable Lead Implant Date: 20050106
Implantable Lead Implant Date: 20131122
Implantable Lead Location: 753859
Lead Channel Pacing Threshold Pulse Width: 0.4 ms
Lead Channel Pacing Threshold Pulse Width: 0.4 ms
Lead Channel Setting Pacing Pulse Width: 0.4 ms
Lead Channel Setting Sensing Sensitivity: 4 mV
MDC IDC LEAD LOCATION: 753860
MDC IDC MSMT BATTERY IMPEDANCE: 305 Ohm
MDC IDC MSMT BATTERY REMAINING LONGEVITY: 118 mo
MDC IDC MSMT LEADCHNL RA IMPEDANCE VALUE: 442 Ohm
MDC IDC MSMT LEADCHNL RA PACING THRESHOLD AMPLITUDE: 0.5 V
MDC IDC MSMT LEADCHNL RV IMPEDANCE VALUE: 762 Ohm
MDC IDC MSMT LEADCHNL RV PACING THRESHOLD AMPLITUDE: 0.75 V
MDC IDC PG IMPLANT DT: 20131122
MDC IDC SET LEADCHNL RA PACING AMPLITUDE: 1.5 V
MDC IDC SET LEADCHNL RV PACING AMPLITUDE: 2 V
MDC IDC STAT BRADY AP VP PERCENT: 0 %
MDC IDC STAT BRADY AS VP PERCENT: 0 %

## 2016-08-19 DIAGNOSIS — H25812 Combined forms of age-related cataract, left eye: Secondary | ICD-10-CM | POA: Diagnosis not present

## 2016-08-19 DIAGNOSIS — Z961 Presence of intraocular lens: Secondary | ICD-10-CM | POA: Diagnosis not present

## 2016-09-03 DIAGNOSIS — H2512 Age-related nuclear cataract, left eye: Secondary | ICD-10-CM | POA: Diagnosis not present

## 2016-09-03 DIAGNOSIS — H268 Other specified cataract: Secondary | ICD-10-CM | POA: Diagnosis not present

## 2016-09-11 DIAGNOSIS — Z961 Presence of intraocular lens: Secondary | ICD-10-CM | POA: Diagnosis not present

## 2016-09-11 DIAGNOSIS — H5319 Other subjective visual disturbances: Secondary | ICD-10-CM | POA: Diagnosis not present

## 2016-10-20 ENCOUNTER — Ambulatory Visit (INDEPENDENT_AMBULATORY_CARE_PROVIDER_SITE_OTHER): Payer: Medicare HMO | Admitting: *Deleted

## 2016-10-20 ENCOUNTER — Telehealth: Payer: Self-pay | Admitting: Cardiology

## 2016-10-20 DIAGNOSIS — I495 Sick sinus syndrome: Secondary | ICD-10-CM | POA: Diagnosis not present

## 2016-10-20 NOTE — Progress Notes (Signed)
Remote pacemaker transmission.   

## 2016-10-20 NOTE — Telephone Encounter (Signed)
Spoke with pt and reminded pt of remote transmission that is due today. Pt verbalized understanding.   

## 2016-10-22 ENCOUNTER — Encounter: Payer: Self-pay | Admitting: Cardiology

## 2016-10-22 DIAGNOSIS — N183 Chronic kidney disease, stage 3 (moderate): Secondary | ICD-10-CM | POA: Diagnosis not present

## 2016-10-22 DIAGNOSIS — Z6830 Body mass index (BMI) 30.0-30.9, adult: Secondary | ICD-10-CM | POA: Diagnosis not present

## 2016-10-22 DIAGNOSIS — Z23 Encounter for immunization: Secondary | ICD-10-CM | POA: Diagnosis not present

## 2016-10-22 DIAGNOSIS — I129 Hypertensive chronic kidney disease with stage 1 through stage 4 chronic kidney disease, or unspecified chronic kidney disease: Secondary | ICD-10-CM | POA: Diagnosis not present

## 2016-10-22 LAB — CUP PACEART REMOTE DEVICE CHECK
Battery Voltage: 2.78 V
Brady Statistic AP VP Percent: 0 %
Brady Statistic AP VS Percent: 89 %
Brady Statistic AS VS Percent: 10 %
Date Time Interrogation Session: 20180925161633
Implantable Lead Implant Date: 20131122
Implantable Lead Location: 753859
Implantable Lead Model: 5594
Lead Channel Pacing Threshold Pulse Width: 0.4 ms
Lead Channel Pacing Threshold Pulse Width: 0.4 ms
Lead Channel Setting Pacing Pulse Width: 0.4 ms
Lead Channel Setting Sensing Sensitivity: 4 mV
MDC IDC LEAD IMPLANT DT: 20050106
MDC IDC LEAD LOCATION: 753860
MDC IDC MSMT BATTERY IMPEDANCE: 355 Ohm
MDC IDC MSMT BATTERY REMAINING LONGEVITY: 111 mo
MDC IDC MSMT LEADCHNL RA IMPEDANCE VALUE: 414 Ohm
MDC IDC MSMT LEADCHNL RA PACING THRESHOLD AMPLITUDE: 0.5 V
MDC IDC MSMT LEADCHNL RV IMPEDANCE VALUE: 675 Ohm
MDC IDC MSMT LEADCHNL RV PACING THRESHOLD AMPLITUDE: 0.875 V
MDC IDC PG IMPLANT DT: 20131122
MDC IDC SET LEADCHNL RA PACING AMPLITUDE: 1.5 V
MDC IDC SET LEADCHNL RV PACING AMPLITUDE: 2 V
MDC IDC STAT BRADY AS VP PERCENT: 0 %

## 2016-11-02 DIAGNOSIS — Z1211 Encounter for screening for malignant neoplasm of colon: Secondary | ICD-10-CM | POA: Diagnosis not present

## 2016-11-30 DIAGNOSIS — N183 Chronic kidney disease, stage 3 (moderate): Secondary | ICD-10-CM | POA: Diagnosis not present

## 2017-01-07 DIAGNOSIS — H20022 Recurrent acute iridocyclitis, left eye: Secondary | ICD-10-CM | POA: Diagnosis not present

## 2017-01-20 ENCOUNTER — Ambulatory Visit (INDEPENDENT_AMBULATORY_CARE_PROVIDER_SITE_OTHER): Payer: Medicare HMO | Admitting: *Deleted

## 2017-01-20 ENCOUNTER — Telehealth: Payer: Self-pay | Admitting: Cardiology

## 2017-01-20 DIAGNOSIS — I495 Sick sinus syndrome: Secondary | ICD-10-CM | POA: Diagnosis not present

## 2017-01-20 NOTE — Telephone Encounter (Signed)
LMOVM reminding pt to send remote transmission.   

## 2017-01-21 ENCOUNTER — Encounter: Payer: Self-pay | Admitting: Cardiology

## 2017-01-21 DIAGNOSIS — N182 Chronic kidney disease, stage 2 (mild): Secondary | ICD-10-CM | POA: Diagnosis not present

## 2017-01-21 DIAGNOSIS — E039 Hypothyroidism, unspecified: Secondary | ICD-10-CM | POA: Diagnosis not present

## 2017-01-21 DIAGNOSIS — R41840 Attention and concentration deficit: Secondary | ICD-10-CM | POA: Diagnosis not present

## 2017-01-21 DIAGNOSIS — I129 Hypertensive chronic kidney disease with stage 1 through stage 4 chronic kidney disease, or unspecified chronic kidney disease: Secondary | ICD-10-CM | POA: Diagnosis not present

## 2017-01-21 DIAGNOSIS — R5383 Other fatigue: Secondary | ICD-10-CM | POA: Diagnosis not present

## 2017-01-21 DIAGNOSIS — N183 Chronic kidney disease, stage 3 (moderate): Secondary | ICD-10-CM | POA: Diagnosis not present

## 2017-01-21 DIAGNOSIS — N08 Glomerular disorders in diseases classified elsewhere: Secondary | ICD-10-CM | POA: Diagnosis not present

## 2017-01-21 LAB — CUP PACEART REMOTE DEVICE CHECK
Brady Statistic AP VP Percent: 0 %
Brady Statistic AS VP Percent: 0 %
Brady Statistic AS VS Percent: 10 %
Date Time Interrogation Session: 20181226211411
Implantable Lead Implant Date: 20131122
Implantable Lead Location: 753859
Implantable Lead Model: 5594
Lead Channel Pacing Threshold Amplitude: 0.5 V
Lead Channel Pacing Threshold Amplitude: 0.75 V
Lead Channel Sensing Intrinsic Amplitude: 8 mV
Lead Channel Setting Pacing Amplitude: 1.5 V
MDC IDC LEAD IMPLANT DT: 20050106
MDC IDC LEAD LOCATION: 753860
MDC IDC MSMT BATTERY IMPEDANCE: 329 Ohm
MDC IDC MSMT BATTERY REMAINING LONGEVITY: 115 mo
MDC IDC MSMT BATTERY VOLTAGE: 2.78 V
MDC IDC MSMT LEADCHNL RA IMPEDANCE VALUE: 443 Ohm
MDC IDC MSMT LEADCHNL RA PACING THRESHOLD PULSEWIDTH: 0.4 ms
MDC IDC MSMT LEADCHNL RV IMPEDANCE VALUE: 719 Ohm
MDC IDC MSMT LEADCHNL RV PACING THRESHOLD PULSEWIDTH: 0.4 ms
MDC IDC PG IMPLANT DT: 20131122
MDC IDC SET LEADCHNL RV PACING AMPLITUDE: 2 V
MDC IDC SET LEADCHNL RV PACING PULSEWIDTH: 0.4 ms
MDC IDC SET LEADCHNL RV SENSING SENSITIVITY: 4 mV
MDC IDC STAT BRADY AP VS PERCENT: 90 %

## 2017-01-21 NOTE — Progress Notes (Signed)
Remote pacemaker transmission.   

## 2017-02-05 DIAGNOSIS — H20022 Recurrent acute iridocyclitis, left eye: Secondary | ICD-10-CM | POA: Diagnosis not present

## 2017-03-05 DIAGNOSIS — H20022 Recurrent acute iridocyclitis, left eye: Secondary | ICD-10-CM | POA: Diagnosis not present

## 2017-04-06 DIAGNOSIS — H20022 Recurrent acute iridocyclitis, left eye: Secondary | ICD-10-CM | POA: Diagnosis not present

## 2017-04-21 ENCOUNTER — Ambulatory Visit (INDEPENDENT_AMBULATORY_CARE_PROVIDER_SITE_OTHER): Payer: Medicare HMO | Admitting: *Deleted

## 2017-04-21 DIAGNOSIS — I495 Sick sinus syndrome: Secondary | ICD-10-CM

## 2017-04-21 NOTE — Progress Notes (Signed)
Remote pacemaker transmission.   

## 2017-04-23 LAB — CUP PACEART REMOTE DEVICE CHECK
Battery Remaining Longevity: 112 mo
Battery Voltage: 2.79 V
Brady Statistic AS VP Percent: 0 %
Brady Statistic AS VS Percent: 9 %
Implantable Lead Implant Date: 20131122
Implantable Lead Location: 753859
Implantable Lead Model: 5594
Implantable Pulse Generator Implant Date: 20131122
Lead Channel Impedance Value: 437 Ohm
Lead Channel Impedance Value: 717 Ohm
Lead Channel Pacing Threshold Amplitude: 0.5 V
Lead Channel Pacing Threshold Amplitude: 0.75 V
Lead Channel Pacing Threshold Pulse Width: 0.4 ms
Lead Channel Pacing Threshold Pulse Width: 0.4 ms
Lead Channel Setting Pacing Amplitude: 1.5 V
Lead Channel Setting Pacing Pulse Width: 0.4 ms
Lead Channel Setting Sensing Sensitivity: 4 mV
MDC IDC LEAD IMPLANT DT: 20050106
MDC IDC LEAD LOCATION: 753860
MDC IDC MSMT BATTERY IMPEDANCE: 354 Ohm
MDC IDC SESS DTM: 20190327171308
MDC IDC SET LEADCHNL RV PACING AMPLITUDE: 2 V
MDC IDC STAT BRADY AP VP PERCENT: 0 %
MDC IDC STAT BRADY AP VS PERCENT: 91 %

## 2017-04-26 ENCOUNTER — Encounter: Payer: Self-pay | Admitting: Cardiology

## 2017-04-28 ENCOUNTER — Telehealth: Payer: Self-pay | Admitting: Cardiovascular Disease

## 2017-04-28 NOTE — Telephone Encounter (Signed)
Closed Encounter  °

## 2017-06-28 ENCOUNTER — Encounter: Payer: Self-pay | Admitting: *Deleted

## 2017-07-21 ENCOUNTER — Encounter: Payer: Medicare HMO | Admitting: Cardiovascular Disease

## 2017-07-21 ENCOUNTER — Telehealth: Payer: Self-pay

## 2017-07-21 ENCOUNTER — Ambulatory Visit (INDEPENDENT_AMBULATORY_CARE_PROVIDER_SITE_OTHER): Payer: Medicare HMO | Admitting: *Deleted

## 2017-07-21 DIAGNOSIS — I495 Sick sinus syndrome: Secondary | ICD-10-CM

## 2017-07-21 NOTE — Telephone Encounter (Signed)
Pt called needing advice about his home remote transmission.

## 2017-07-21 NOTE — Progress Notes (Signed)
Remote pacemaker transmission.   

## 2017-07-21 NOTE — Telephone Encounter (Signed)
LMOVM reminding pt to send remote transmission.   

## 2017-07-22 ENCOUNTER — Other Ambulatory Visit: Payer: Self-pay | Admitting: Nurse Practitioner

## 2017-07-22 ENCOUNTER — Encounter: Payer: Self-pay | Admitting: Cardiology

## 2017-07-22 ENCOUNTER — Ambulatory Visit
Admission: RE | Admit: 2017-07-22 | Discharge: 2017-07-22 | Disposition: A | Discharge status: Home / Self Care | Payer: Medicare HMO | Source: Ambulatory Visit | Attending: Nurse Practitioner | Admitting: Nurse Practitioner

## 2017-07-22 DIAGNOSIS — R7309 Other abnormal glucose: Secondary | ICD-10-CM | POA: Diagnosis not present

## 2017-07-22 DIAGNOSIS — I129 Hypertensive chronic kidney disease with stage 1 through stage 4 chronic kidney disease, or unspecified chronic kidney disease: Secondary | ICD-10-CM | POA: Diagnosis not present

## 2017-07-22 DIAGNOSIS — N08 Glomerular disorders in diseases classified elsewhere: Secondary | ICD-10-CM | POA: Diagnosis not present

## 2017-07-22 DIAGNOSIS — Z6830 Body mass index (BMI) 30.0-30.9, adult: Secondary | ICD-10-CM | POA: Diagnosis not present

## 2017-07-22 DIAGNOSIS — Z950 Presence of cardiac pacemaker: Secondary | ICD-10-CM | POA: Diagnosis not present

## 2017-07-22 DIAGNOSIS — R634 Abnormal weight loss: Secondary | ICD-10-CM | POA: Diagnosis not present

## 2017-07-22 DIAGNOSIS — Z1389 Encounter for screening for other disorder: Secondary | ICD-10-CM | POA: Diagnosis not present

## 2017-07-22 DIAGNOSIS — N183 Chronic kidney disease, stage 3 (moderate): Secondary | ICD-10-CM | POA: Diagnosis not present

## 2017-07-22 DIAGNOSIS — R41840 Attention and concentration deficit: Secondary | ICD-10-CM | POA: Diagnosis not present

## 2017-07-22 DIAGNOSIS — R5383 Other fatigue: Secondary | ICD-10-CM | POA: Diagnosis not present

## 2017-07-22 DIAGNOSIS — R918 Other nonspecific abnormal finding of lung field: Secondary | ICD-10-CM | POA: Diagnosis not present

## 2017-07-22 DIAGNOSIS — M791 Myalgia, unspecified site: Secondary | ICD-10-CM | POA: Diagnosis not present

## 2017-07-22 DIAGNOSIS — I10 Essential (primary) hypertension: Secondary | ICD-10-CM | POA: Diagnosis not present

## 2017-07-22 LAB — CUP PACEART REMOTE DEVICE CHECK
Battery Remaining Longevity: 111 mo
Brady Statistic AP VS Percent: 91 %
Brady Statistic AS VP Percent: 0 %
Brady Statistic AS VS Percent: 9 %
Date Time Interrogation Session: 20190627012038
Implantable Lead Implant Date: 20131122
Implantable Lead Location: 753859
Implantable Lead Model: 5092
Implantable Lead Model: 5594
Implantable Pulse Generator Implant Date: 20131122
Lead Channel Impedance Value: 678 Ohm
Lead Channel Pacing Threshold Amplitude: 0.5 V
Lead Channel Pacing Threshold Amplitude: 0.75 V
Lead Channel Pacing Threshold Pulse Width: 0.4 ms
Lead Channel Pacing Threshold Pulse Width: 0.4 ms
Lead Channel Setting Pacing Amplitude: 1.5 V
Lead Channel Setting Pacing Amplitude: 2 V
Lead Channel Setting Pacing Pulse Width: 0.4 ms
MDC IDC LEAD IMPLANT DT: 20050106
MDC IDC LEAD LOCATION: 753860
MDC IDC MSMT BATTERY IMPEDANCE: 354 Ohm
MDC IDC MSMT BATTERY VOLTAGE: 2.79 V
MDC IDC MSMT LEADCHNL RA IMPEDANCE VALUE: 414 Ohm
MDC IDC SET LEADCHNL RV SENSING SENSITIVITY: 2.8 mV
MDC IDC STAT BRADY AP VP PERCENT: 0 %

## 2017-07-22 NOTE — Progress Notes (Signed)
Letter  

## 2017-07-27 ENCOUNTER — Other Ambulatory Visit: Payer: Self-pay | Admitting: Nurse Practitioner

## 2017-07-27 DIAGNOSIS — R911 Solitary pulmonary nodule: Secondary | ICD-10-CM

## 2017-08-07 ENCOUNTER — Other Ambulatory Visit: Payer: Medicare HMO

## 2017-08-11 ENCOUNTER — Ambulatory Visit
Admission: RE | Admit: 2017-08-11 | Discharge: 2017-08-11 | Disposition: A | Discharge status: Home / Self Care | Payer: Medicare HMO | Source: Ambulatory Visit | Attending: Nurse Practitioner | Admitting: Nurse Practitioner

## 2017-08-11 ENCOUNTER — Other Ambulatory Visit: Payer: Medicare HMO

## 2017-08-11 DIAGNOSIS — R911 Solitary pulmonary nodule: Secondary | ICD-10-CM

## 2017-08-11 DIAGNOSIS — J439 Emphysema, unspecified: Secondary | ICD-10-CM | POA: Diagnosis not present

## 2017-08-18 NOTE — Progress Notes (Signed)
Patient ID: Aaron Snyder, male   DOB: 09/24/1950, 67 y.o.   MRN: [id number]    Cardiology Office Note    Date:  08/19/2017   ID:  Aaron Snyder, Aaron Snyder 1950/07/28, MRN [id number]  PCP:  Aaron Chard, Snyder  Cardiologist:   Aaron Klein, Snyder   Chief Complaint  Patient presents with  . Fatigue    History of Present Illness:  Aaron Snyder is a 67 y.o. male who returns for follow-up on his pacemaker, with new complaints of fatigue.  In late 2016, he noticed a reduction in exercise tolerance and his nuclear perfusion study appeared to show a large, new inferior wall scar. Coronary angiography actually showed very minor coronary atherosclerotic lesions (max stenosis 25%) as well as normal left ventricular regional and global systolic function.  We brought him back for a treadmill stress test in October 2017 and adjusted his pacemaker sensor settings.  This led to a remarkable improvement in exercise capacity.  However, he has subsequently noticed a marked reduction in his ability to exercise.  He wakes up feeling tired.  He has to force himself to go to the gym, which he does 3 days a week, but feels tired after the first 10 minutes, whereas before he was accustomed to 70 minutes of uninterrupted aerobic exercise.  He describes a lot of symptoms of anhedonia.  He has chronic back pain which limits his ability to do some of the things he wants.  He does not truly describe dyspnea on exertion.  He has never had angina on exertion.  He reports that he had a normal sleep study back in 2013.  He also tells me that his primary care provider has checked thyroid and testosterone levels and these were normal.  He is not anemic.  There is no clear explanation for his fatigue.  He was worried about an abnormality detected on the chest x-ray, but chest CT showed that this was actually projection of a chronic benign rib abnormality.  He does have some emphysema changes in his lungs.  He quit smoking 20  years ago.  Incidental note was made of minor aneurysmal dilatation of the distal aortic arch and descending thoracic aorta with diameter of 3.8-3.9 cm.  He has over 90% atrial pacing.  He never requires ventricular pacing.  Very recent remote pacemaker interrogation shows excellent heart rate histograms, in fact deviating to the right, suggesting possibly excessive sensor settings (also possibly explained by his previous propensity for very frequent and sustained exercise).  There have been no meaningful episodes of atrial arrhythmia.  HHe is h has recorded in the past.  He has rare but occasionally long episodes of nonsustained ventricular tachycardia.  However none have occurred since April 17, 2017.  Estimated generator longevity is 9 years.  Lead parameters are excellent.  Coronary angiography has shown no progression of disease from 2005 to 2013 to 2016. Has a a history of head injury and cervical spine problems, GERD, pneumonia 2016.     Past Medical History:  Diagnosis Date  . Coronary artery disease   . Dysphagia   . Head injury   . Hypertension   . Pacemaker generator end of life 12/18/2011   Medtronic  . Presence of permanent cardiac pacemaker 02/01/2003   Medtronic  . Syncope     Past Surgical History:  Procedure Laterality Date  . CARDIAC CATHETERIZATION N/A 12/26/2014   Procedure: Left Heart Cath and Coronary Angiography;  Surgeon: Aaron Aaron Snyder;  Location: [facility];  Service: Cardiovascular;  Laterality: N/A;  . ESOPHAGOGASTRODUODENOSCOPY     Aaron Snyder  . LEFT AND RIGHT HEART CATHETERIZATION WITH CORONARY ANGIOGRAM N/A 12/17/2011   Procedure: LEFT AND RIGHT HEART CATHETERIZATION WITH CORONARY ANGIOGRAM;  Surgeon: Aaron Man, Snyder;  Location: [facility] CATH LAB;  Service: Cardiovascular;  Laterality: N/A;  . LOOP RECORDER IMPLANT/EXPLANT  2005  . NM MYOCAR PERF WALL MOTION  01/2011   Negative  . PACEMAKER GENERATOR CHANGE  12/18/2011   Medtronic  . PACEMAKER  INSERTION  2005   Medtronic  . PERMANENT PACEMAKER GENERATOR CHANGE N/A 12/18/2011   Procedure: PERMANENT PACEMAKER GENERATOR CHANGE;  Surgeon: Aaron Lance, Snyder;  Location: [facility] CATH LAB;  Service: Cardiovascular;  Laterality: N/A;  . TOTAL SHOULDER ARTHROPLASTY      Outpatient Medications Prior to Visit  Medication Sig Dispense Refill  . amLODipine (NORVASC) 10 MG tablet Take 10 mg by mouth daily.    . Candesartan Cilexetil-HCTZ 32-25 MG TABS Take 1 tablet by mouth daily.    [name] loratadine (CLARITIN) 10 MG tablet Take 10 mg by mouth daily.    . Multiple Vitamins-Minerals (MULTIVITAMIN WITH MINERALS) tablet Take 1 tablet by mouth daily.    . Zinc 50 MG CAPS Take 1 capsule by mouth daily.    . cetirizine (ZYRTEC) 10 MG tablet Take 10 mg by mouth daily.    . cyclobenzaprine (FLEXERIL) 5 MG tablet     . diazepam (VALIUM) 5 MG tablet Take 5 mg by mouth daily as needed for anxiety.     . EDARBYCLOR 40-25 MG TABS TAKE 1 TABLET DAILY. (Patient not taking: Reported on 04/07/2016) 30 tablet 1  . oxyCODONE (OXY IR/ROXICODONE) 5 MG immediate release tablet      No facility-administered medications prior to visit.      Allergies:   Apple; Carrot [daucus carota]; Iodinated diagnostic agents; Iohexol; Oat; Soy allergy; Wheat; Almond meal; and Cow's milk [lac bovis]   Social History   Socioeconomic History  . Marital status: Widowed    Spouse name: Not on file  . Number of children: Not on file  . Years of education: Not on file  . Highest education level: Not on file  Occupational History  . Occupation: Disabled    Fish farm manager: UNEMPLOYED  Social Needs  . Financial resource strain: Not on file  . Food insecurity:    Worry: Not on file    Inability: Not on file  . Transportation needs:    Medical: Not on file    Non-medical: Not on file  Tobacco Use  . Smoking status: Former Smoker    Packs/day: 1.00    Years: 15.00    Pack years: 15.00    Types: Cigarettes    Last attempt to quit:  03/15/1996    Years since quitting: 21.4  . Smokeless tobacco: Never Used  Substance and Sexual Activity  . Alcohol use: No  . Drug use: No  . Sexual activity: Not on file  Lifestyle  . Physical activity:    Days per week: Not on file    Minutes per session: Not on file  . Stress: Not on file  Relationships  . Social connections:    Talks on phone: Not on file    Gets together: Not on file    Attends religious service: Not on file    Active member of club or organization: Not on file    Attends meetings of clubs or organizations: Not on  file    Relationship status: Not on file  Other Topics Concern  . Not on file  Social History Narrative  . Not on file      ROS:   Please see the history of present illness.    ROS All other systems reviewed and are negative  PHYSICAL EXAM:   VS:  BP 127/71   Pulse 68   Ht 5\' 11"  (1.803 m)   Wt 210 lb (95.3 kg)   BMI 29.29 kg/m     General: Alert, oriented x3, no distress, appears very fit and healthy Head: no evidence of trauma, PERRL, EOMI, no exophtalmos or lid lag, no myxedema, no xanthelasma; normal ears, nose and oropharynx Neck: normal jugular venous pulsations and no hepatojugular reflux; brisk carotid pulses without delay and no carotid bruits Chest: clear to auscultation, no signs of consolidation by percussion or palpation, normal fremitus, symmetrical and full respiratory excursions Cardiovascular: normal position and quality of the apical impulse, regular rhythm, normal first and second heart sounds, no murmurs, rubs or gallops Abdomen: no tenderness or distention, no masses by palpation, no abnormal pulsatility or arterial bruits, normal bowel sounds, no hepatosplenomegaly Extremities: no clubbing, cyanosis or edema; 2+ radial, ulnar and brachial pulses bilaterally; 2+ right femoral, posterior tibial and dorsalis pedis pulses; 2+ left femoral, posterior tibial and dorsalis pedis pulses; no subclavian or femoral  bruits Neurological: grossly nonfocal Psych: Normal mood and affect   Wt Readings from Last 3 Encounters:  08/19/17 210 lb (95.3 kg)  10/28/15 215 lb 6.4 oz (97.7 kg)  09/16/15 214 lb (97.1 kg)      Studies/Labs Reviewed:   EKG:  EKG is ordered today.  It shows atrial paced, ventricular sensed rhythm, otherwise normal  Recent Labs: No results found for requested labs within last 8760 hours.  Requested most recent labs from Dr. Baird Cancer office.  ASSESSMENT:    1. Chronotropic incompetence with sinus node dysfunction (HCC)   2. Essential hypertension   3. Pacemaker - dual chamber Medtronic 2004, new generator 2013   4. Thoracic aortic aneurysm without rupture ([place])   5. CKD (chronic kidney disease) stage 4, GFR 15-29 ml/min (HCC)     PLAN:  In order of problems listed above:  1. SSS: I do not think his current complaints of fatigue have anything to do with chronotropic incompetence anymore.  His heart rate histogram shows aggressive in other settings and I do not think we can optimize these. 2. CAD:  Mild plaque by previous angiography.; chest pain has led to angiography on 3 different occasions, most recently December 2016, without any evidence of progression of coronary disease and without significant obstructive lesions. 3. HTN: Well-controlled. 4. PPM: Normally functioning dual-chamber device.  5. Ao aneurysm: Very mild dilation of the distal arch/descending thoracic aorta.  Prefer not to monitor with CT angiography since this would involve contrast exposure.  MRI is possible also related to issues related to his pacemaker (although not entirely prohibitive).  Another option would be TEE. 6. CKD stage 2-3: Now seeing Dr. Moshe Cipro after Dr. Etheleen Nicks retirement. 7. Fatigue: His symptoms today make me concerned that he may have some degree of depression.  This could explain his symptoms.  He has not seen his psychiatrist in a couple of years and I encouraged him to make an  appointment.   Medication Adjustments/Labs and Tests Ordered: Current medicines are reviewed at length with the patient today.  Concerns regarding medicines are outlined above.  Medication changes, Labs and  Tests ordered today are listed in the Patient Instructions below. Patient Instructions  Medication Instructions:  Your physician recommends that you continue on your current medications as directed. Please refer to the Current Medication list given to you today.   Labwork: none  Testing/Procedures: none  Follow-Up: Remote monitoring is used to monitor your Pacemaker or ICD from home. This monitoring reduces the number of office visits required to check your device to one time per year. It allows [place] to keep an eye on the functioning of your device to ensure it is working properly. You are scheduled for a device check from home on 3 months. You may send your transmission at any time that day. If you have a wireless device, the transmission will be sent automatically. After your physician reviews your transmission, you will receive a postcard with your next transmission date.  Your physician wants you to follow-up in: 12 months with Dr. Sallyanne Kuster. You will receive a reminder letter in the mail two months in advance. If you don't receive a letter, please call our office to schedule the follow-up appointment.   Any Other Special Instructions Will Be Listed Below (If Applicable).     If you need a refill on your cardiac medications before your next appointment, please call your pharmacy.        Signed, Aaron Klein, Snyder  08/19/2017 11:05 AM    [address], [place], [place]  [address] Phone: [phone]; Fax: [phone]

## 2017-08-19 ENCOUNTER — Ambulatory Visit: Payer: Medicare HMO | Admitting: Cardiovascular Disease

## 2017-08-19 ENCOUNTER — Encounter: Payer: Self-pay | Admitting: Cardiovascular Disease

## 2017-08-19 VITALS — BP 127/71 | HR 68 | Ht 71.0 in | Wt 210.0 lb

## 2017-08-19 DIAGNOSIS — I712 Thoracic aortic aneurysm, without rupture, unspecified: Secondary | ICD-10-CM

## 2017-08-19 DIAGNOSIS — Z950 Presence of cardiac pacemaker: Secondary | ICD-10-CM

## 2017-08-19 DIAGNOSIS — I10 Essential (primary) hypertension: Secondary | ICD-10-CM | POA: Diagnosis not present

## 2017-08-19 DIAGNOSIS — I495 Sick sinus syndrome: Secondary | ICD-10-CM | POA: Diagnosis not present

## 2017-08-19 DIAGNOSIS — N184 Chronic kidney disease, stage 4 (severe): Secondary | ICD-10-CM | POA: Diagnosis not present

## 2017-08-19 DIAGNOSIS — I498 Other specified cardiac arrhythmias: Secondary | ICD-10-CM

## 2017-08-19 NOTE — Patient Instructions (Signed)
Medication Instructions:  Your physician recommends that you continue on your current medications as directed. Please refer to the Current Medication list given to you today.   Labwork: none  Testing/Procedures: none  Follow-Up: Remote monitoring is used to monitor your Pacemaker or ICD from home. This monitoring reduces the number of office visits required to check your device to one time per year. It allows [place] to keep an eye on the functioning of your device to ensure it is working properly. You are scheduled for a device check from home on 3 months. You may send your transmission at any time that day. If you have a wireless device, the transmission will be sent automatically. After your physician reviews your transmission, you will receive a postcard with your next transmission date.  Your physician wants you to follow-up in: 12 months with Dr. Sallyanne Kuster. You will receive a reminder letter in the mail two months in advance. If you don't receive a letter, please call our office to schedule the follow-up appointment.   Any Other Special Instructions Will Be Listed Below (If Applicable).     If you need a refill on your cardiac medications before your next appointment, please call your pharmacy.

## 2017-08-29 ENCOUNTER — Ambulatory Visit (HOSPITAL_COMMUNITY)
Admission: EM | Admit: 2017-08-29 | Discharge: 2017-08-29 | Disposition: A | Discharge status: Home / Self Care | Payer: Medicare HMO | Attending: Family Medicine | Admitting: Family Medicine

## 2017-08-29 ENCOUNTER — Encounter (HOSPITAL_COMMUNITY): Payer: Self-pay | Admitting: Emergency Medicine

## 2017-08-29 ENCOUNTER — Other Ambulatory Visit: Payer: Self-pay

## 2017-08-29 DIAGNOSIS — H5789 Other specified disorders of eye and adnexa: Secondary | ICD-10-CM

## 2017-08-29 MED ORDER — PREDNISOLONE ACETATE 1 % OP SUSP: 1.0000 [drp] | Freq: Four times a day (QID) | OPHTHALMIC | 1 refills | Status: DC

## 2017-08-29 NOTE — ED Triage Notes (Signed)
The patient presented to the [facility] with a complaint of a left eye "infection."  The patient reported that he had cataract surgery one year ago and has had repeated infections. The patient reported that he normally takes oral prednisone for the symptoms but lost them on vacation last week.

## 2017-08-29 NOTE — ED Provider Notes (Signed)
[facility]    CSN: [id number] Arrival date & time: 08/29/17  1005     History   Chief Complaint Chief Complaint  Patient presents with  . Eye Problem    HPI Aaron Snyder is a 67 y.o. male.   HPI  Patient is here for eye irritation.  He states this is happened on a recurring basis ever since his cataract surgery.  He normally is on a allergy/antihistamine eyedrop daily.  He also uses liquid tears daily.  When he has a flareup he uses prednisone.  Unfortunately, he is in the process of moving and has misplaced his prednisolone eyedrops.  He is here today for a prescription.  His eyes are red and irritated.  Somewhat scratchy.  Normal vision.  No photophobia.  No discharge.  No foreign body sensation.  Past Medical History:  Diagnosis Date  . Coronary artery disease   . Dysphagia   . Head injury   . Hypertension   . Pacemaker generator end of life 12/18/2011   Medtronic  . Presence of permanent cardiac pacemaker 02/01/2003   Medtronic  . Syncope     Patient Active Problem List   Diagnosis Date Noted  . Thoracic aortic aneurysm ([place]) 08/19/2017  . CKD (chronic kidney disease) stage 4, GFR 15-29 ml/min (HCC) 08/19/2017  . Chronotropic incompetence with sinus node dysfunction (HCC) 02/22/2015  . Abnormal nuclear stress test   . Atypical chest pain 11/24/2013  . Pleural effusion, left 10/15/2013  . Pleuritic chest pain 10/15/2013  . LUQ pain 10/15/2013  . Abnormal computed tomography of duodenum 10/14/2013  . Abdominal pain 10/14/2013  . Consolidation lung ([place]) 10/14/2013  . HTN (hypertension) 12/07/2012  . S/P cardiac pacemaker procedure, 12/18/11, new generator 12/18/2011  . Syncope X 3 per the pt. 12/18/2011  . Dysphagia 12/16/2011  . Unstable angina, admitted with SSCP and arm pain 12/14/11, neg MI, no obstructive CAD by cath 12/17/11 12/16/2011  . Abnormal echocardiogram, EF 40-45% 12/15/11 (new c/w Sep 2013) 12/16/2011  . Contrast media allergy  12/16/2011  . Positive D dimer, VQ low risk 12/15/2011  . Chest pain 12/14/2011  . Pacemaker - dual chamber Medtronic 2004, new generator 2013 12/14/2011  . Coronary artery disease nonobstructive cath 2005 and 2016, false positive Myoview Nov 2016.   [name] Hypertension     Past Surgical History:  Procedure Laterality Date  . CARDIAC CATHETERIZATION N/A 12/26/2014   Procedure: Left Heart Cath and Coronary Angiography;  Surgeon: [name], MD;  Location: [facility];  Service: Cardiovascular;  Laterality: N/A;  . ESOPHAGOGASTRODUODENOSCOPY     Aaron Snyder  . LEFT AND RIGHT HEART CATHETERIZATION WITH CORONARY ANGIOGRAM N/A 12/17/2011   Procedure: LEFT AND RIGHT HEART CATHETERIZATION WITH CORONARY ANGIOGRAM;  Surgeon: [name], MD;  Location: [facility] CATH LAB;  Service: Cardiovascular;  Laterality: N/A;  . LOOP RECORDER IMPLANT/EXPLANT  2005  . NM MYOCAR PERF WALL MOTION  01/2011   Negative  . PACEMAKER GENERATOR CHANGE  12/18/2011   Medtronic  . PACEMAKER INSERTION  2005   Medtronic  . PERMANENT PACEMAKER GENERATOR CHANGE N/A 12/18/2011   Procedure: PERMANENT PACEMAKER GENERATOR CHANGE;  Surgeon: [name], MD;  Location: [facility] CATH LAB;  Service: Cardiovascular;  Laterality: N/A;  . TOTAL SHOULDER ARTHROPLASTY         Home Medications    Prior to Admission medications   Medication Sig Start Date End Date Taking? Authorizing Provider  amLODipine (NORVASC) 10 MG tablet Take  10 mg by mouth daily. 07/25/13   [provider]  Candesartan Cilexetil-HCTZ 32-25 MG TABS Take 1 tablet by mouth daily.    [provider]  loratadine (CLARITIN) 10 MG tablet Take 10 mg by mouth daily.    [provider]  Multiple Vitamins-Minerals (MULTIVITAMIN WITH MINERALS) tablet Take 1 tablet by mouth daily.    [provider]  prednisoLONE acetate (PRED FORTE) 1 % ophthalmic suspension Place 1 drop into the left eye 4 (four) times daily. 08/29/17   Raylene Everts,  MD  Zinc 50 MG CAPS Take 1 capsule by mouth daily.    [provider]    Family History Family History  Problem Relation Age of Onset  . Diabetes Father   . Hypertension Father     Social History Social History   Tobacco Use  . Smoking status: Former Smoker    Packs/day: 1.00    Years: 15.00    Pack years: 15.00    Types: Cigarettes    Last attempt to quit: 03/15/1996    Years since quitting: 21.4  . Smokeless tobacco: Never Used  Substance Use Topics  . Alcohol use: No  . Drug use: No     Allergies   Apple; Carrot [daucus carota]; Iodinated diagnostic agents; Iohexol; Oat; Soy allergy; Wheat; Almond meal; and Cow's milk [lac bovis]   Review of Systems Review of Systems  Constitutional: Negative for chills and fever.  HENT: Negative for ear pain and sore throat.   Eyes: Positive for redness. Negative for photophobia, pain, discharge, itching and visual disturbance.  Respiratory: Negative for cough and shortness of breath.   Cardiovascular: Negative for chest pain and palpitations.  Gastrointestinal: Negative for abdominal pain and vomiting.  Genitourinary: Negative for dysuria and hematuria.  Musculoskeletal: Negative for arthralgias and back pain.  Skin: Negative for color change and rash.  Neurological: Negative for seizures and syncope.  All other systems reviewed and are negative.    Physical Exam Triage Vital Signs ED Triage Vitals  Enc Vitals Group     BP 08/29/17 1035 (!) 173/90     Pulse Rate 08/29/17 1035 91     Resp 08/29/17 1035 16     Temp 08/29/17 1035 98.5 F (36.9 C)     Temp Source 08/29/17 1035 Oral     SpO2 08/29/17 1035 99 %     Weight --      Height --      Head Circumference --      Peak Flow --      Pain Score 08/29/17 1036 8     Pain Loc --      Pain Edu? --      Excl. in [place]? --    No data found.  Updated Vital Signs BP (!) 173/90 (BP Location: Right Arm)   Pulse 91   Temp 98.5 F (36.9 C) (Oral)   Resp 16    SpO2 99%   Visual Acuity Right Eye Distance: 20/50 Left Eye Distance: 20/70 Bilateral Distance: 20/40      Physical Exam  Constitutional: He appears well-developed and well-nourished. No distress.  HENT:  Head: Normocephalic and atraumatic.  Mouth/Throat: Oropharynx is clear and moist.  Eyes: Pupils are equal, round, and reactive to light.  Left eye with conjunctival injection.  Good extraocular motion.  No foreign body seen.  No discharge.  Neck: Normal range of motion.  Cardiovascular: Normal rate.  Pulmonary/Chest: Effort normal. No respiratory distress.  Abdominal: Soft.  He exhibits no distension.  Musculoskeletal: Normal range of motion. He exhibits no edema.  Neurological: He is alert.  Skin: Skin is warm and dry.     UC Treatments / Results  Labs (all labs ordered are listed, but only abnormal results are displayed) Labs Reviewed - No data to display  EKG None  Radiology No results found.  Procedures Procedures (including critical care time)  Medications Ordered in UC Medications - No data to display  Initial Impression / Assessment and Plan / UC Course  I have reviewed the triage vital signs and the nursing notes.  Pertinent labs & imaging results that were available during my care of the patient were reviewed by me and considered in my medical decision making (see chart for details).     Staining was not done due to patient's history of recurring eye problems for a year.  This is a typical recurrence for him.  He is given prednisone and told to follow-up with his eye doctor.  He is advised to call in the morning for follow-up Final Clinical Impressions(s) / UC Diagnoses   Final diagnoses:  Eye irritation     Discharge Instructions     Use the prednisone drops as prescribed See your eye doctor in follow up   ED Prescriptions    Medication Sig Dispense Auth. Provider   prednisoLONE acetate (PRED FORTE) 1 % ophthalmic suspension Place 1 drop  into the left eye 4 (four) times daily. 5 mL Raylene Everts, MD     Controlled Substance Prescriptions [place] Controlled Substance Registry consulted? Not Applicable   Raylene Everts, MD 08/29/17 [phone]

## 2017-08-29 NOTE — Discharge Instructions (Signed)
Use the prednisone drops as prescribed See your eye doctor in follow up

## 2017-08-30 DIAGNOSIS — H26492 Other secondary cataract, left eye: Secondary | ICD-10-CM | POA: Diagnosis not present

## 2017-08-30 DIAGNOSIS — H20022 Recurrent acute iridocyclitis, left eye: Secondary | ICD-10-CM | POA: Diagnosis not present

## 2017-10-05 DIAGNOSIS — Z961 Presence of intraocular lens: Secondary | ICD-10-CM | POA: Diagnosis not present

## 2017-10-05 DIAGNOSIS — H20022 Recurrent acute iridocyclitis, left eye: Secondary | ICD-10-CM | POA: Diagnosis not present

## 2017-10-05 DIAGNOSIS — H26492 Other secondary cataract, left eye: Secondary | ICD-10-CM | POA: Diagnosis not present

## 2017-10-05 DIAGNOSIS — H40013 Open angle with borderline findings, low risk, bilateral: Secondary | ICD-10-CM | POA: Diagnosis not present

## 2017-10-12 DIAGNOSIS — N183 Chronic kidney disease, stage 3 (moderate): Secondary | ICD-10-CM | POA: Diagnosis not present

## 2017-10-12 DIAGNOSIS — Z23 Encounter for immunization: Secondary | ICD-10-CM

## 2017-10-12 DIAGNOSIS — E039 Hypothyroidism, unspecified: Secondary | ICD-10-CM | POA: Diagnosis not present

## 2017-10-12 DIAGNOSIS — R5383 Other fatigue: Secondary | ICD-10-CM | POA: Diagnosis not present

## 2017-10-12 DIAGNOSIS — I10 Essential (primary) hypertension: Secondary | ICD-10-CM | POA: Diagnosis not present

## 2017-10-12 DIAGNOSIS — I129 Hypertensive chronic kidney disease with stage 1 through stage 4 chronic kidney disease, or unspecified chronic kidney disease: Secondary | ICD-10-CM | POA: Diagnosis not present

## 2017-10-12 DIAGNOSIS — N08 Glomerular disorders in diseases classified elsewhere: Secondary | ICD-10-CM | POA: Diagnosis not present

## 2017-10-20 ENCOUNTER — Telehealth: Payer: Self-pay

## 2017-10-20 ENCOUNTER — Ambulatory Visit (INDEPENDENT_AMBULATORY_CARE_PROVIDER_SITE_OTHER): Payer: Medicare HMO | Admitting: *Deleted

## 2017-10-20 DIAGNOSIS — I495 Sick sinus syndrome: Secondary | ICD-10-CM | POA: Diagnosis not present

## 2017-10-20 NOTE — Telephone Encounter (Signed)
LMOVM reminding pt to send remote transmission.   

## 2017-10-21 NOTE — Progress Notes (Signed)
Remote pacemaker transmission.   

## 2017-10-22 ENCOUNTER — Encounter: Payer: Self-pay | Admitting: Cardiology

## 2017-11-09 LAB — CUP PACEART REMOTE DEVICE CHECK
Battery Remaining Longevity: 109 mo
Battery Voltage: 2.78 V
Brady Statistic AP VS Percent: 91 %
Brady Statistic AS VP Percent: 0 %
Brady Statistic AS VS Percent: 9 %
Implantable Lead Implant Date: 20050106
Implantable Lead Implant Date: 20131122
Implantable Lead Location: 753859
Implantable Lead Location: 753860
Implantable Lead Model: 5594
Implantable Pulse Generator Implant Date: 20131122
Lead Channel Pacing Threshold Amplitude: 0.5 V
Lead Channel Pacing Threshold Pulse Width: 0.4 ms
Lead Channel Pacing Threshold Pulse Width: 0.4 ms
Lead Channel Setting Pacing Amplitude: 1.5 V
Lead Channel Setting Pacing Amplitude: 2 V
Lead Channel Setting Pacing Pulse Width: 0.4 ms
MDC IDC MSMT BATTERY IMPEDANCE: 377 Ohm
MDC IDC MSMT LEADCHNL RA IMPEDANCE VALUE: 420 Ohm
MDC IDC MSMT LEADCHNL RV IMPEDANCE VALUE: 628 Ohm
MDC IDC MSMT LEADCHNL RV PACING THRESHOLD AMPLITUDE: 0.625 V
MDC IDC SESS DTM: 20190925172955
MDC IDC SET LEADCHNL RV SENSING SENSITIVITY: 2.8 mV
MDC IDC STAT BRADY AP VP PERCENT: 0 %

## 2017-11-24 IMAGING — US US RENAL
1 series · 14 of 25 positions shown · non-contrast
Comparison: CT scan of the abdomen pelvis of October 14, 2013

CLINICAL DATA: Chronic renal insufficiency stage III

EXAM:
RENAL / URINARY TRACT ULTRASOUND COMPLETE

[Series 1: us renal · 0.23mm/px · 14 of 36 slices shown]
[im 1/36]
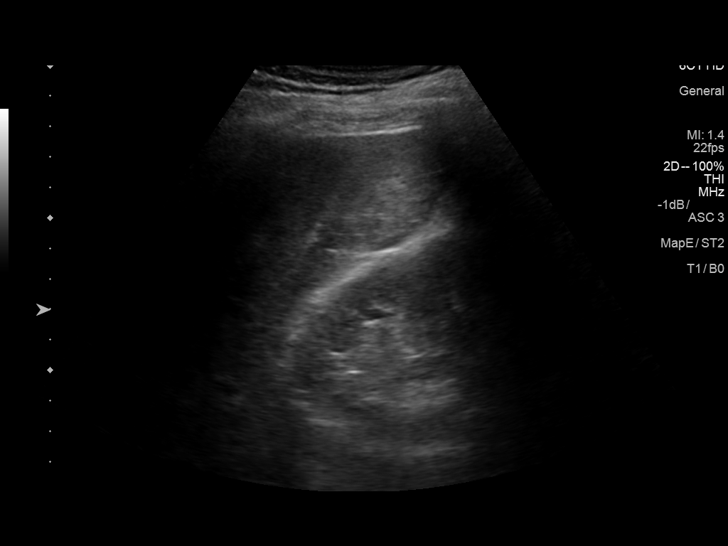
[im 3/36]
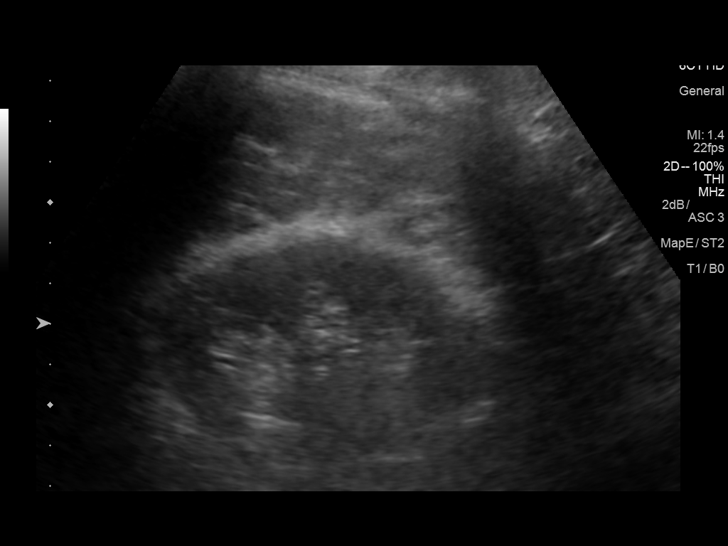
[im 6/36]
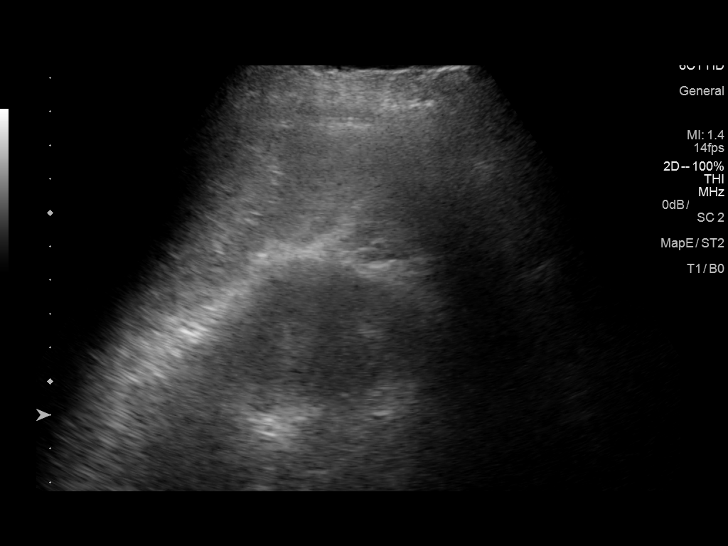
[im 9/36]
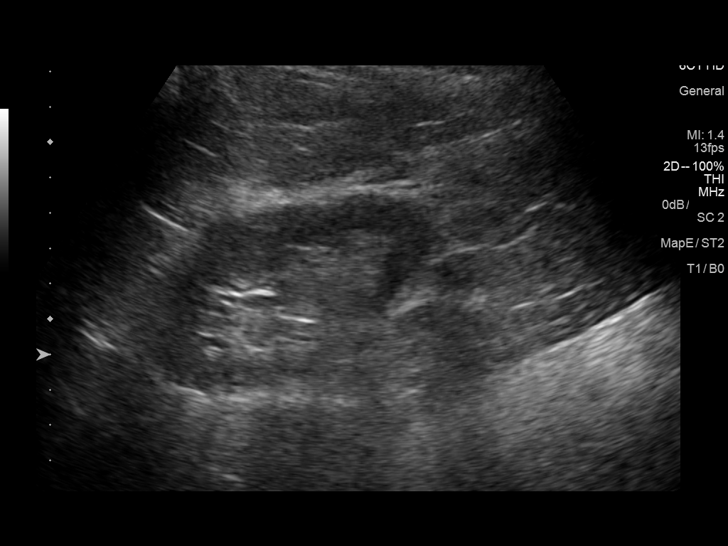
[im 12/36]
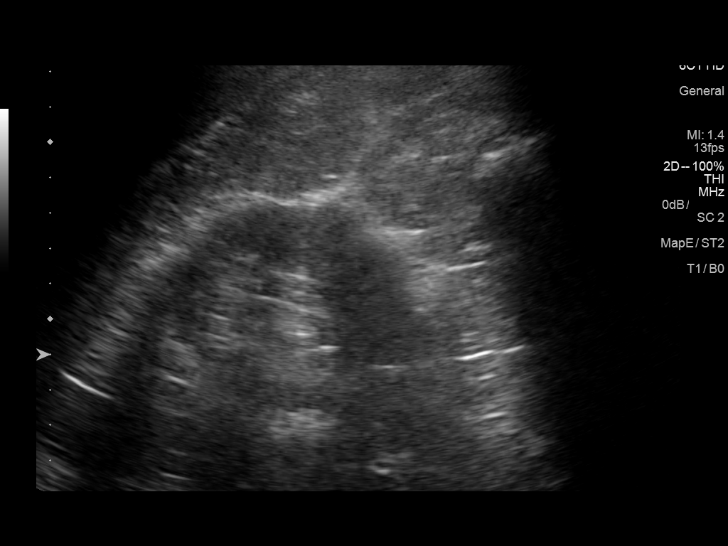
[im 14/36]
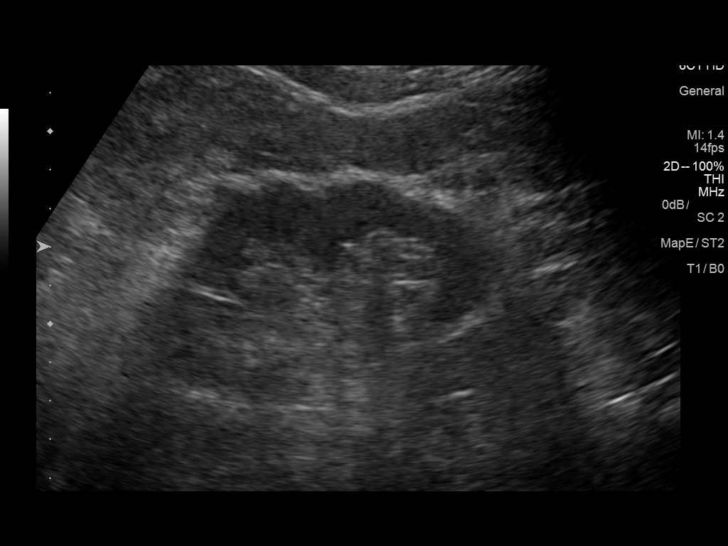
[im 17/36]
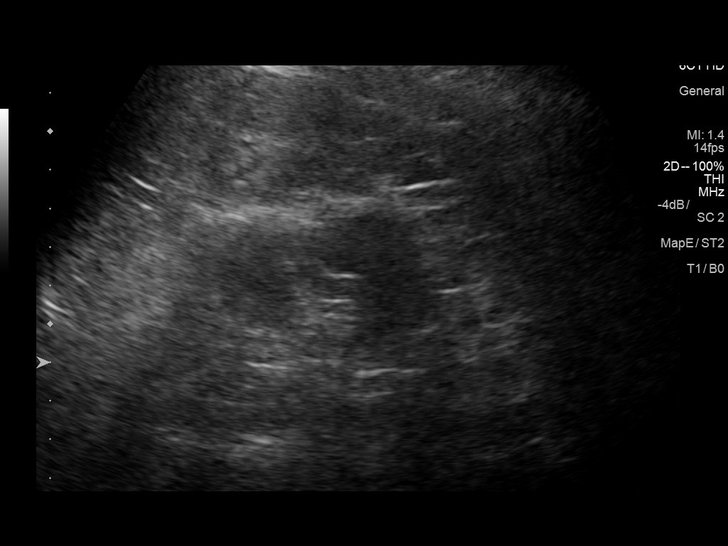
[im 19/36]
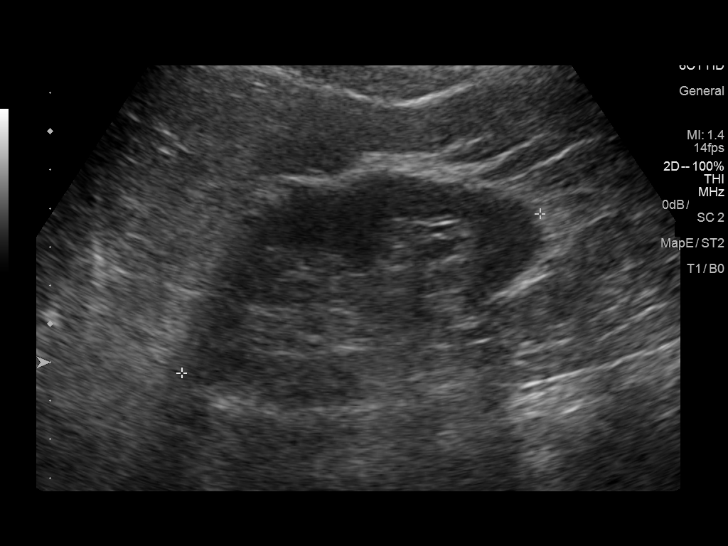
[im 22/36]
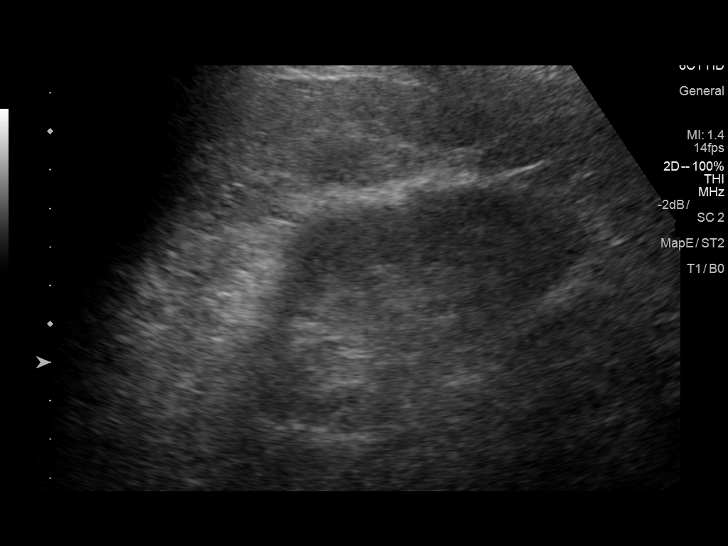
[im 24/36]
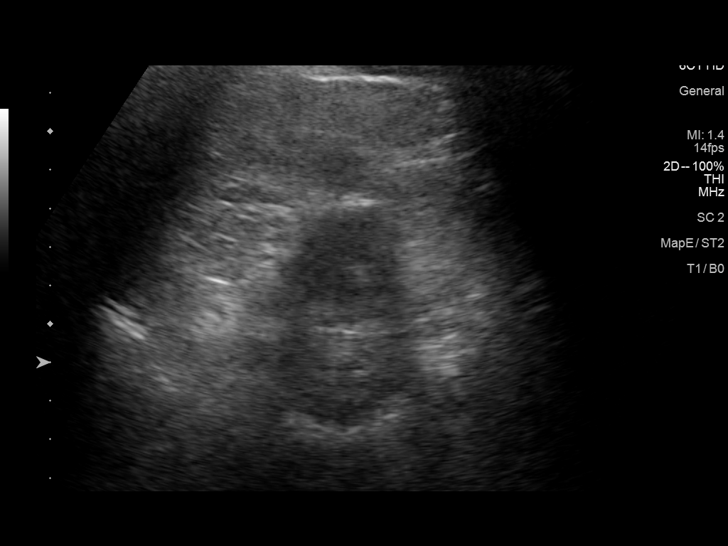
[im 27/36]
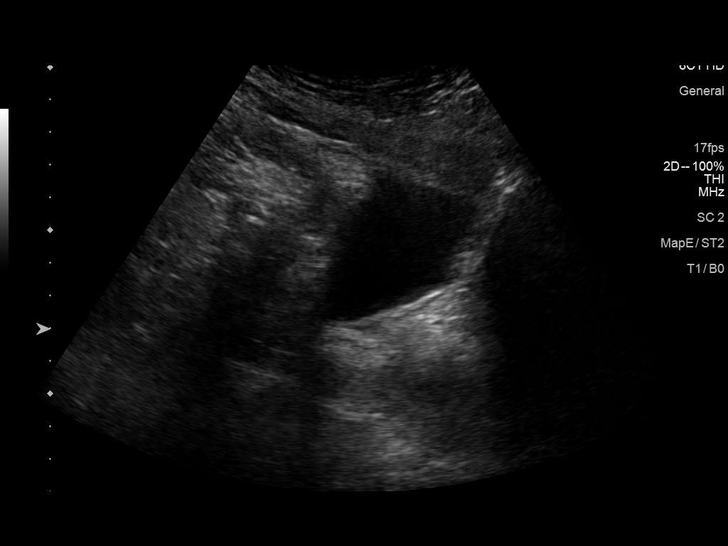
[im 30/36]
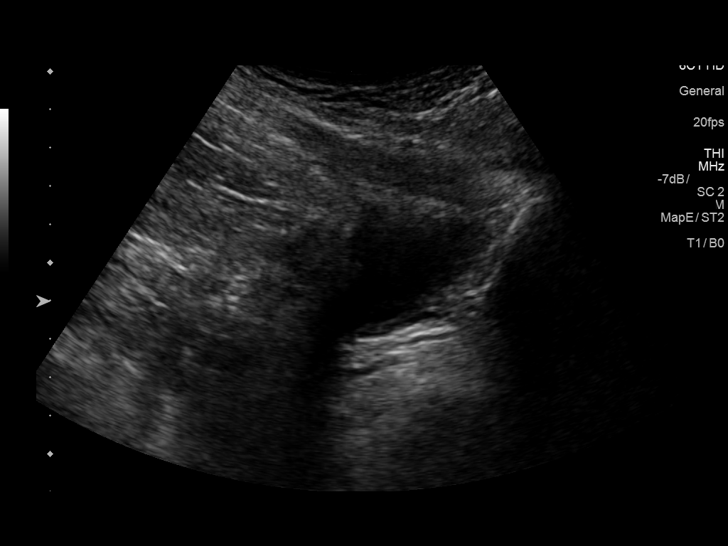
[im 33/36]
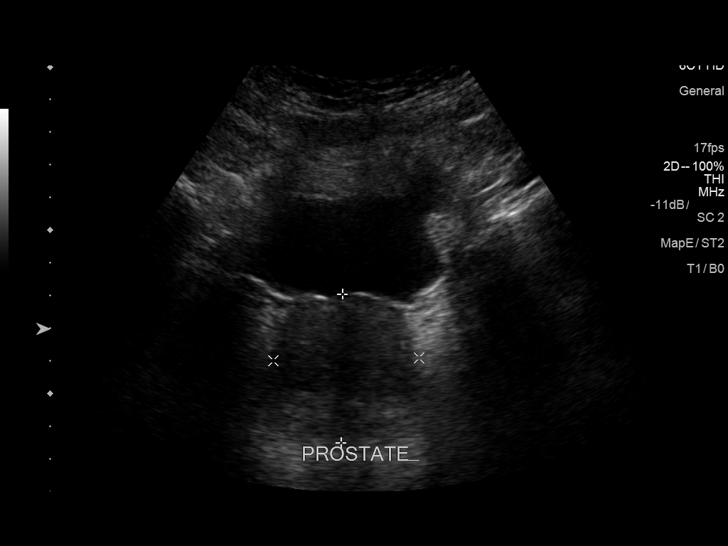
[im 36/36]
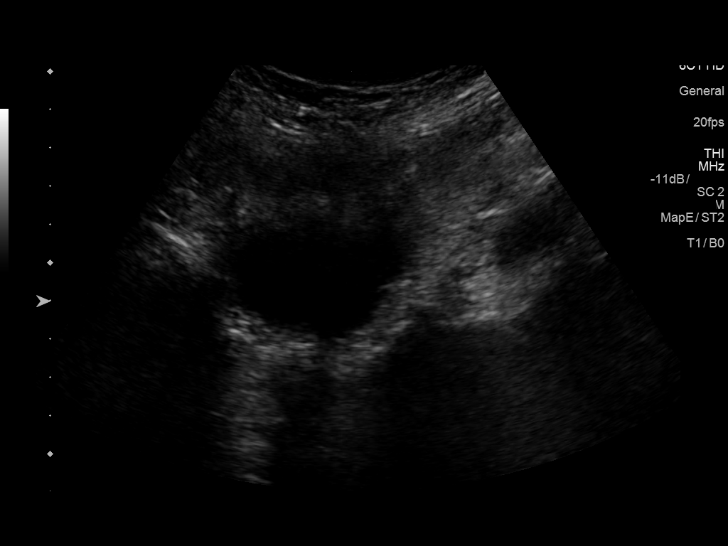

[14 of 25 positions shown; findings below may reference images not displayed]

FINDINGS: Right Kidney:

Length: 9 cm. Echogenicity remains slightly lower than that of the
liver. No mass or hydronephrosis visualized.

Left Kidney:

Length: 10 cm. Echogenicity similar to that on the right. No mass or
hydronephrosis visualized.

Bladder:

Appears normal for degree of bladder distention. The prostate gland
is mildly enlarged measuring 4.5 x 4.5 x 4.4 cm.
IMPRESSION: No acute or significant chronic abnormality of the kidneys is
observed. There is a moderate impression upon the urinary bladder
base by the prostate gland.

## 2018-01-20 ENCOUNTER — Ambulatory Visit: Payer: Medicare HMO

## 2018-01-23 LAB — CUP PACEART REMOTE DEVICE CHECK
Battery Impedance: 427 Ohm
Battery Remaining Longevity: 105 mo
Brady Statistic AP VP Percent: 0 %
Implantable Lead Implant Date: 20050106
Implantable Lead Implant Date: 20131122
Implantable Lead Location: 753860
Implantable Lead Model: 5092
Implantable Pulse Generator Implant Date: 20131122
Lead Channel Impedance Value: 431 Ohm
Lead Channel Setting Pacing Amplitude: 1.5 V
Lead Channel Setting Pacing Amplitude: 2 V
Lead Channel Setting Pacing Pulse Width: 0.4 ms
Lead Channel Setting Sensing Sensitivity: 2.8 mV
MDC IDC LEAD LOCATION: 753859
MDC IDC MSMT BATTERY VOLTAGE: 2.79 V
MDC IDC MSMT LEADCHNL RA PACING THRESHOLD AMPLITUDE: 0.5 V
MDC IDC MSMT LEADCHNL RA PACING THRESHOLD PULSEWIDTH: 0.4 ms
MDC IDC MSMT LEADCHNL RV IMPEDANCE VALUE: 678 Ohm
MDC IDC MSMT LEADCHNL RV PACING THRESHOLD AMPLITUDE: 0.625 V
MDC IDC MSMT LEADCHNL RV PACING THRESHOLD PULSEWIDTH: 0.4 ms
MDC IDC SESS DTM: 20191226193232
MDC IDC STAT BRADY AP VS PERCENT: 92 %
MDC IDC STAT BRADY AS VP PERCENT: 0 %
MDC IDC STAT BRADY AS VS PERCENT: 8 %

## 2018-01-25 ENCOUNTER — Encounter: Payer: Self-pay | Admitting: Cardiology

## 2018-02-03 ENCOUNTER — Ambulatory Visit: Payer: Medicare HMO

## 2018-02-03 ENCOUNTER — Encounter: Payer: Self-pay | Admitting: Nurse Practitioner

## 2018-02-03 ENCOUNTER — Ambulatory Visit (INDEPENDENT_AMBULATORY_CARE_PROVIDER_SITE_OTHER): Payer: Medicare HMO

## 2018-02-03 ENCOUNTER — Ambulatory Visit (INDEPENDENT_AMBULATORY_CARE_PROVIDER_SITE_OTHER): Payer: Medicare HMO | Admitting: Nurse Practitioner

## 2018-02-03 VITALS — BP 130/86 | HR 99 | Temp 97.7°F | Ht 70.6 in | Wt 216.4 lb

## 2018-02-03 VITALS — BP 130/86 | HR 99 | Temp 97.7°F | Ht 70.6 in

## 2018-02-03 DIAGNOSIS — I712 Thoracic aortic aneurysm, without rupture, unspecified: Secondary | ICD-10-CM

## 2018-02-03 DIAGNOSIS — Z23 Encounter for immunization: Secondary | ICD-10-CM

## 2018-02-03 DIAGNOSIS — Z0000 Encounter for general adult medical examination without abnormal findings: Secondary | ICD-10-CM

## 2018-02-03 DIAGNOSIS — Z950 Presence of cardiac pacemaker: Secondary | ICD-10-CM

## 2018-02-03 DIAGNOSIS — Z1159 Encounter for screening for other viral diseases: Secondary | ICD-10-CM

## 2018-02-03 DIAGNOSIS — I10 Essential (primary) hypertension: Secondary | ICD-10-CM | POA: Diagnosis not present

## 2018-02-03 DIAGNOSIS — R5382 Chronic fatigue, unspecified: Secondary | ICD-10-CM | POA: Insufficient documentation

## 2018-02-03 DIAGNOSIS — Z125 Encounter for screening for malignant neoplasm of prostate: Secondary | ICD-10-CM

## 2018-02-03 DIAGNOSIS — I495 Sick sinus syndrome: Secondary | ICD-10-CM | POA: Diagnosis not present

## 2018-02-03 LAB — POCT URINALYSIS DIPSTICK
Bilirubin, UA: NEGATIVE
GLUCOSE UA: NEGATIVE
NITRITE UA: NEGATIVE
PH UA: 7 (ref 5.0–8.0)
Protein, UA: NEGATIVE
SPEC GRAV UA: 1.015 (ref 1.010–1.025)
UROBILINOGEN UA: 0.2 U/dL

## 2018-02-03 LAB — POCT UA - MICROALBUMIN
Albumin/Creatinine Ratio, Urine, POC: 30
Creatinine, POC: 100 mg/dL
Microalbumin Ur, POC: 10 mg/L

## 2018-02-03 MED ORDER — TETANUS-DIPHTH-ACELL PERTUSSIS 5-2-15.5 LF-MCG/0.5 IM SUSP: 0.5000 mL | Freq: Once | INTRAMUSCULAR | 0 refills | Status: AC

## 2018-02-03 MED ORDER — AMLODIPINE BESYLATE 10 MG PO TABS: 10.0000 mg | ORAL_TABLET | Freq: Every day | ORAL | 1 refills | Status: DC

## 2018-02-03 MED ORDER — PNEUMOCOCCAL 13-VAL CONJ VACC IM SUSP: 0.5000 mL | INTRAMUSCULAR | 0 refills | Status: AC

## 2018-02-03 NOTE — Progress Notes (Signed)
Subjective:     Patient ID: Aaron Snyder , male    DOB: 03-09-1950 , 68 y.o.   MRN: [id number]   Chief Complaint  Patient presents with  . Fatigue  . Hypertension   Men's preventive visit. Patient Health Questionnaire (PHQ-2) is    Office Visit from 02/03/2018 in [facility]  PHQ-2 Total Score  0     Patient is on a regular diet. Marital status: Widowed. Relevant history for alcohol use is:  Social History   Substance and Sexual Activity  Alcohol Use No   Relevant history for tobacco use is:  Social History   Tobacco Use  Smoking Status Former Smoker  . Packs/day: 1.00  . Years: 15.00  . Pack years: 15.00  . Types: Cigarettes  . Last attempt to quit: 03/15/1996  . Years since quitting: 21.9  Smokeless Tobacco Never Used  . HPI  He awakens 2-5 times per night, he reports he drinks water throughout the day and even before he goes to bed.  His fatigue has been ongoing.    He is also using CBD and edible marijuana for pain.    Hypertension  This is a chronic problem. The current episode started more than 1 year ago. The problem is unchanged. The problem is controlled. Pertinent negatives include no anxiety, chest pain, headaches or palpitations. There are no associated agents to hypertension. Risk factors for coronary artery disease include dyslipidemia. Past treatments include ACE inhibitors and diuretics. There are no compliance problems.  There is no history of kidney disease.     Past Medical History:  Diagnosis Date  . Coronary artery disease   . Dysphagia   . Head injury   . Hypertension   . Pacemaker generator end of life 12/18/2011   Medtronic  . Presence of permanent cardiac pacemaker 02/01/2003   [employer]tronic  . Syncope      Family History  Problem Relation Age of Onset  . Diabetes Father   . Hypertension Father      Current Outpatient Medications:  .  amLODipine (NORVASC) 10 MG tablet, Take 10 mg by mouth daily., Disp: , Rfl:   .  aspirin EC 81 MG tablet, Take 81 mg by mouth daily., Disp: , Rfl:  .  Candesartan Cilexetil-HCTZ 32-25 MG TABS, Take 1 tablet by mouth daily., Disp: , Rfl:  .  loratadine (CLARITIN) 10 MG tablet, Take 10 mg by mouth daily., Disp: , Rfl:  .  Multiple Vitamins-Minerals (MULTIVITAMIN WITH MINERALS) tablet, Take 1 tablet by mouth daily., Disp: , Rfl:  .  prednisoLONE acetate (PRED FORTE) 1 % ophthalmic suspension, Place 1 drop into the left eye 4 (four) times daily., Disp: 5 mL, Rfl: 1 .  Zinc 50 MG CAPS, Take 1 capsule by mouth daily., Disp: , Rfl:    Allergies  Allergen Reactions  . Apple Anaphylaxis  . Carrot [Daucus Carota] Anaphylaxis  . Iodinated Diagnostic Agents Shortness Of Breath and Swelling    PER PATIENT BREAK THROUGH REACTION WITH PRE MEDS-2016-----Can have contrast as long as he has a 13 prep  . Iohexol Shortness Of Breath and Swelling    Pt requires 13hr premeds.  . Oat Anaphylaxis  . Soy Allergy Anaphylaxis  . Wheat Anaphylaxis  . Almond Meal Itching  . Cow's Milk [Lac Bovis]     Hives, and upset stomach      Review of Systems  Constitutional: Positive for fatigue.  HENT: Negative.   Eyes: Negative.   Respiratory:  Negative.   Cardiovascular: Negative.  Negative for chest pain, palpitations and leg swelling.  Gastrointestinal: Negative.   Endocrine: Negative for polydipsia, polyphagia and polyuria.  Genitourinary: Negative.   Musculoskeletal: Negative.   Skin: Negative.   Allergic/Immunologic: Negative.   Neurological: Negative for dizziness, syncope and headaches.  Hematological: Negative.   Psychiatric/Behavioral: Negative.      Today's Vitals   02/03/18 0946  BP: 130/86  Pulse: 99  Temp: 97.7 F (36.5 C)  TempSrc: Oral  SpO2: 96%  Height: 5' 10.6" (1.793 m)  PainSc: 8   PainLoc: Back   Body mass index is 29.62 kg/m.   Objective:  Physical Exam Vitals signs reviewed.  Constitutional:      General: He is not in acute distress.     Appearance: Normal appearance. He is not ill-appearing.  HENT:     Head: Normocephalic and atraumatic.     Right Ear: External ear normal.     Left Ear: External ear normal.     Nose: Nose normal. No congestion.     Mouth/Throat:     Mouth: Mucous membranes are moist.  Eyes:     Extraocular Movements: Extraocular movements intact.     Conjunctiva/sclera: Conjunctivae normal.     Pupils: Pupils are equal, round, and reactive to light.  Neck:     Musculoskeletal: Normal range of motion and neck supple. No muscular tenderness.  Cardiovascular:     Rate and Rhythm: Normal rate and regular rhythm.     Pulses: Normal pulses.     Heart sounds: Normal heart sounds.  Pulmonary:     Effort: Pulmonary effort is normal.     Breath sounds: Normal breath sounds.  Abdominal:     General: Abdomen is flat.     Palpations: Abdomen is soft.  Musculoskeletal: Normal range of motion.  Skin:    General: Skin is warm and dry.     Capillary Refill: Capillary refill takes less than 2 seconds.  Neurological:     General: No focal deficit present.     Mental Status: He is alert.  Psychiatric:        Mood and Affect: Mood normal.        Behavior: Behavior normal.        Thought Content: Thought content normal.        Judgment: Judgment normal.         Assessment And Plan:     1. Routine general medical examination at a health care facility  His Medicare Annual Wellness was done by [facility] . Behavior modifications discussed and diet history reviewed.   . Pt will continue to exercise regularly and modify diet with low GI, plant based foods and decrease intake of processed foods.  . Recommend intake of daily multivitamin, Vitamin D, and calcium.  . Recommend for preventive screenings, as well as recommend immunizations that include influenza, TDAP both sent to pharmacy  2. Encounter for hepatitis C screening test for low risk patient  Will check for Hepatitis C screening due to being born between  the years [birth year] - Hepatitis C antibody screen  3. Encounter for prostate cancer screening  - PSA(Must document that pt has been informed of limitations of PSA testing.)  4. Encounter for immunization  Tetanus and prevnar 13 Rxs sent to pharmacy - pneumococcal 13-valent conjugate vaccine (PREVNAR 13) SUSP injection; Inject 0.5 mLs into the muscle tomorrow at 10 am for 1 dose.  Dispense: 0.5 mL; Refill: 0 - Tdap (ADACEL) 05-27-13.5  LF-MCG/0.5 injection; Inject 0.5 mLs into the muscle once for 1 dose.  Dispense: 0.5 mL; Refill: 0  5. Chronic fatigue  This is ongoing, metabolic labs do not indicate a cause.  I will refer him to a sleep provider to evaluate further his chronic fatigue - Ambulatory referral to Neurology - CBC - TSH  6. Essential hypertension . B/P is controlled.  . CMP ordered to check renal function.  . The importance of regular exercise and dietary modification was stressed to the patient.  - amLODipine (NORVASC) 10 MG tablet; Take 1 tablet (10 mg total) by mouth daily.  Dispense: 90 tablet; Refill: 1 - EKG 12-Lead - Comprehensive metabolic panel - POCT Urinalysis Dipstick (81002) - POCT UA - Microalbumin  7. Thoracic aortic aneurysm without rupture [facility])  Being followed by Cardiology  He will have a repeat CT scan in approximately 6 months  8. Chronotropic incompetence with sinus node dysfunction [facility])  Has a pacemaker last checked in December by Cardiology     [name], FNP

## 2018-02-03 NOTE — Progress Notes (Signed)
Subjective:   Aaron Snyder is a 68 y.o. male who presents for Medicare Annual/Subsequent preventive examination.  Review of Systems:  n/a Cardiac Risk Factors include: advanced age (>29men, >82 women);hypertension;obesity (BMI >30kg/m2);male gender     Objective:    Vitals: BP 130/86 (Patient Position: Sitting)   Pulse 99   Temp 97.7 F (36.5 C) (Oral)   Ht 5' 10.6" (1.793 m)   Wt 216 lb 6.4 oz (98.2 kg)   SpO2 96%   BMI 30.52 kg/m   Body mass index is 30.52 kg/m.  Advanced Directives 02/03/2018 12/26/2014 10/14/2013 12/15/2011  Does Patient Have a Medical Advance Directive? No No No Patient does not have advance directive;Patient would not like information  Would patient like information on creating a medical advance directive? Yes (MAU/Ambulatory/Procedural Areas - Information given) Yes - Educational materials given No - patient declined information -    Tobacco Social History   Tobacco Use  Smoking Status Former Smoker  . Packs/day: 1.00  . Years: 15.00  . Pack years: 15.00  . Types: Cigarettes  . Last attempt to quit: 03/15/1996  . Years since quitting: 21.9  Smokeless Tobacco Never Used     Counseling given: Not Answered   Clinical Intake:  Pre-visit preparation completed: Yes  Pain : 0-10 Pain Score: 7  Pain Type: Chronic pain Pain Location: Back Pain Radiating Towards: to legs Pain Descriptors / Indicators: Shooting, Sharp Pain Onset: More than a month ago Pain Frequency: Constant Pain Relieving Factors: nothing helps Effect of Pain on Daily Activities: slows down  Pain Relieving Factors: nothing helps  Nutritional Status: BMI > 30  Obese Nutritional Risks: None Diabetes: No  How often do you need to have someone help you when you read instructions, pamphlets, or other written materials from your doctor or pharmacy?: 1 - Never What is the last grade level you completed in school?: 2 years college  Interpreter Needed?: No  Information  entered by :: NAllen LPN  Past Medical History:  Diagnosis Date  . Coronary artery disease   . Dysphagia   . Head injury   . Hypertension   . Pacemaker generator end of life 12/18/2011   Medtronic  . Presence of permanent cardiac pacemaker 02/01/2003   [employer]tr[employer]  . Syncope    Past Surgical History:  Procedure Laterality Date  . CARDIAC CATHETERIZATION N/A 12/26/2014   Procedure: Left Heart Cath and Coronary Angiography;  Surgeon: [name], MD;  Location: [facility];  Service: Cardiovascular;  Laterality: N/A;  . ESOPHAGOGASTRODUODENOSCOPY     [place]  . LEFT AND RIGHT HEART CATHETERIZATION WITH CORONARY ANGIOGRAM N/A 12/17/2011   Procedure: LEFT AND RIGHT HEART CATHETERIZATION WITH CORONARY ANGIOGRAM;  Surgeon: [name], MD;  Location: [facility];  Service: Cardiovascular;  Laterality: N/A;  . LOOP RECORDER IMPLANT/EXPLANT  2005  . NM MYOCAR PERF WALL MOTION  01/2011   Negative  . PACEMAKER GENERATOR CHANGE  12/18/2011   Medtronic  . PACEMAKER INSERTION  2005   [employer]tronic  . PERMANENT PACEMAKER GENERATOR CHANGE N/A 12/18/2011   Procedure: PERMANENT PACEMAKER GENERATOR CHANGE;  Surgeon: [name], MD;  Location: [facility] CATH LAB;  Service: Cardiovascular;  Laterality: N/A;  . TOTAL SHOULDER ARTHROPLASTY     Family History  Problem Relation Age of Onset  . Diabetes Father   . Hypertension Father    Social History   Socioeconomic History  . Marital status: Widowed    Spouse name: Not on file  .  Number of children: Not on file  . Years of education: Not on file  . Highest education level: Not on file  Occupational History  . Occupation: Disabled    Fish farm manager: UNEMPLOYED  Social Needs  . Financial resource strain: Not hard at all  . Food insecurity:    Worry: Never true    Inability: Never true  . Transportation needs:    Medical: No    Non-medical: No  Tobacco Use  . Smoking status: Former Smoker    Packs/day: 1.00    Years: 15.00    Pack years:  15.00    Types: Cigarettes    Last attempt to quit: 03/15/1996    Years since quitting: 21.9  . Smokeless tobacco: Never Used  Substance and Sexual Activity  . Alcohol use: No  . Drug use: No  . Sexual activity: Not Currently  Lifestyle  . Physical activity:    Days per week: 5 days    Minutes per session: 90 min  . Stress: Not at all  Relationships  . Social connections:    Talks on phone: Not on file    Gets together: Not on file    Attends religious service: Not on file    Active member of club or organization: Not on file    Attends meetings of clubs or organizations: Not on file    Relationship status: Not on file  Other Topics Concern  . Not on file  Social History Narrative  . Not on file    Outpatient Encounter Medications as of 02/03/2018  Medication Sig  . amLODipine (NORVASC) 10 MG tablet Take 1 tablet (10 mg total) by mouth daily.  [name] aspirin EC 81 MG tablet Take 81 mg by mouth daily.  . Candesartan Cilexetil-HCTZ 32-25 MG TABS Take 1 tablet by mouth daily.  [name] loratadine (CLARITIN) 10 MG tablet Take 10 mg by mouth daily.  . Multiple Vitamins-Minerals (MULTIVITAMIN WITH MINERALS) tablet Take 1 tablet by mouth daily.  . pneumococcal 13-valent conjugate vaccine (PREVNAR 13) SUSP injection Inject 0.5 mLs into the muscle tomorrow at 10 am for 1 dose.  . prednisoLONE acetate (PRED FORTE) 1 % ophthalmic suspension Place 1 drop into the left eye 4 (four) times daily.  . Tdap (ADACEL) 05-27-13.5 LF-MCG/0.5 injection Inject 0.5 mLs into the muscle once for 1 dose.  [name] Zinc 50 MG CAPS Take 1 capsule by mouth daily.   No facility-administered encounter medications on file as of 02/03/2018.     Activities of Daily Living In your present state of health, do you have any difficulty performing the following activities: 02/03/2018  Hearing? Y  Comment wax build up  Vision? N  Difficulty concentrating or making decisions? N  Walking or climbing stairs? Y  Comment due to back pain    Dressing or bathing? N  Doing errands, shopping? N  Preparing Food and eating ? N  Using the Toilet? N  In the past six months, have you accidently leaked urine? N  Do you have problems with loss of bowel control? N  Managing your Medications? N  Managing your Finances? N  Housekeeping or managing your Housekeeping? N  Some recent data might be hidden    Patient Care Team: Glendale Chard, MD as PCP - General (Internal Medicine) Warden Fillers, MD as Consulting Physician (Ophthalmology)   Assessment:   This is a routine wellness examination for Aaron Snyder.  Exercise Activities and Dietary recommendations Current Exercise Habits: Structured exercise class, Type of exercise: calisthenics;strength  training/weights, Time (Minutes): > 60, Frequency (Times/Week): 5, Weekly Exercise (Minutes/Week): 0, Intensity: Intense, Exercise limited by: None identified  Goals    . Patient Stated     Wants to be pain free     . Weight (lb) < 200 lb (90.7 kg) (pt-stated)     Wants to lower BMI       Fall Risk Fall Risk  02/03/2018 02/03/2018  Falls in the past year? 0 0  Risk for fall due to : Medication side effect -  Follow up Falls prevention discussed -   Is the patient's home free of loose throw rugs in walkways, pet beds, electrical cords, etc?   yes      Grab bars in the bathroom? no      Handrails on the stairs?   yes      Adequate lighting?   yes  Timed Get Up and Go Performed: n/a  Depression Screen PHQ 2/9 Scores 02/03/2018 02/03/2018  PHQ - 2 Score 4 0  PHQ- 9 Score 13 -    Cognitive Function     6CIT Screen 02/03/2018  What Year? 0 points  What month? 0 points  What time? 3 points  Count back from 20 0 points  Months in reverse 0 points  Repeat phrase 0 points  Total Score 3    Immunization History  Administered Date(s) Administered  . Influenza,inj,Quad PF,6+ Mos 10/16/2013  . Influenza-Unspecified 10/13/2017    Qualifies for Shingles Vaccine?  yes  Screening  Tests Health Maintenance  Topic Date Due  . Hepatitis C Screening  1950/03/16  . TETANUS/TDAP  06/23/1968  . COLONOSCOPY  06/24/1999  . PNA vac Low Risk Adult (1 of 2 - PCV13) 06/24/2014  . INFLUENZA VACCINE  08/26/2017   Cancer Screenings: Lung: Low Dose CT Chest recommended if Age 65-80 years, 30 pack-year currently smoking OR have quit w/in 15years. Patient does not qualify. Colorectal: up to date  Additional Screenings:  Hepatitis C Screening:due      Plan:    Wants to get under 200 pounds, lower BMI and tone up.  I have personally reviewed and noted the following in the patient's chart:   . Medical and social history . Use of alcohol, tobacco or illicit drugs  . Current medications and supplements . Functional ability and status . Nutritional status . Physical activity . Advanced directives . List of other physicians . Hospitalizations, surgeries, and ER visits in previous 12 months . Vitals . Screenings to include cognitive, depression, and falls . Referrals and appointments  In addition, I have reviewed and discussed with patient certain preventive protocols, quality metrics, and best practice recommendations. A written personalized care plan for preventive services as well as general preventive health recommendations were provided to patient.     Kellie Simmering, LPN  08/27/599

## 2018-02-03 NOTE — Patient Instructions (Signed)

## 2018-02-03 NOTE — Patient Instructions (Signed)
[name] , Thank you for taking time to come for your Medicare Wellness Visit. I appreciate your ongoing commitment to your health goals. Please review the following plan we discussed and let me know if I can assist you in the future.   Screening recommendations/referrals: Colonoscopy: FIT test 10/2016 normal Recommended yearly ophthalmology/optometry visit for glaucoma screening and checkup Recommended yearly dental visit for hygiene and checkup  Vaccinations: Influenza vaccine: 09/2017 Pneumococcal vaccine: ordered by PCP Tdap vaccine: 06/2014 Shingles vaccine: discussed    Advanced directives: Advance directive discussed with you today. I have provided a copy for you to complete at home and have notarized. Once this is complete please bring a copy in to our office so we can scan it into your chart.   Conditions/risks identified: Obesity  Next appointment: 08/04/2018 at 8:30a  Preventive Care 69 Years and Older, Male Preventive care refers to lifestyle choices and visits with your health care provider that can promote health and wellness. What does preventive care include?  A yearly physical exam. This is also called an annual well check.  Dental exams once or twice a year.  Routine eye exams. Ask your health care provider how often you should have your eyes checked.  Personal lifestyle choices, including:  Daily care of your teeth and gums.  Regular physical activity.  Eating a healthy diet.  Avoiding tobacco and drug use.  Limiting alcohol use.  Practicing safe sex.  Taking low doses of aspirin every day.  Taking vitamin and mineral supplements as recommended by your health care provider. What happens during an annual well check? The services and screenings done by your health care provider during your annual well check will depend on your age, overall health, lifestyle risk factors, and family history of disease. Counseling  Your health care provider may ask you  questions about your:  Alcohol use.  Tobacco use.  Drug use.  Emotional well-being.  Home and relationship well-being.  Sexual activity.  Eating habits.  History of falls.  Memory and ability to understand (cognition).  Work and work Statistician. Screening  You may have the following tests or measurements:  Height, weight, and BMI.  Blood pressure.  Lipid and cholesterol levels. These may be checked every 5 years, or more frequently if you are over 63 years old.  Skin check.  Lung cancer screening. You may have this screening every year starting at age 15 if you have a 30-pack-year history of smoking and currently smoke or have quit within the past 15 years.  Fecal occult blood test (FOBT) of the stool. You may have this test every year starting at age 75.  Flexible sigmoidoscopy or colonoscopy. You may have a sigmoidoscopy every 5 years or a colonoscopy every 10 years starting at age 37.  Prostate cancer screening. Recommendations will vary depending on your family history and other risks.  Hepatitis C blood test.  Hepatitis B blood test.  Sexually transmitted disease (STD) testing.  Diabetes screening. This is done by checking your blood sugar (glucose) after you have not eaten for a while (fasting). You may have this done every 1-3 years.  Abdominal aortic aneurysm (AAA) screening. You may need this if you are a current or former smoker.  Osteoporosis. You may be screened starting at age 74 if you are at high risk. Talk with your health care provider about your test results, treatment options, and if necessary, the need for more tests. Vaccines  Your health care provider may recommend certain  vaccines, such as:  Influenza vaccine. This is recommended every year.  Tetanus, diphtheria, and acellular pertussis (Tdap, Td) vaccine. You may need a Td booster every 10 years.  Zoster vaccine. You may need this after age 3.  Pneumococcal 13-valent conjugate  (PCV13) vaccine. One dose is recommended after age 76.  Pneumococcal polysaccharide (PPSV23) vaccine. One dose is recommended after age 48. Talk to your health care provider about which screenings and vaccines you need and how often you need them. This information is not intended to replace advice given to you by your health care provider. Make sure you discuss any questions you have with your health care provider. Document Released: 02/08/2015 Document Revised: 10/02/2015 Document Reviewed: 11/13/2014 Elsevier Interactive Patient Education  2017 [place] Prevention in the Home Falls can cause injuries. They can happen to people of all ages. There are many things you can do to make your home safe and to help prevent falls. What can I do on the outside of my home?  Regularly fix the edges of walkways and driveways and fix any cracks.  Remove anything that might make you trip as you walk through a door, such as a raised step or threshold.  Trim any bushes or trees on the path to your home.  Use bright outdoor lighting.  Clear any walking paths of anything that might make someone trip, such as rocks or tools.  Regularly check to see if handrails are loose or broken. Make sure that both sides of any steps have handrails.  Any raised decks and porches should have guardrails on the edges.  Have any leaves, snow, or ice cleared regularly.  Use sand or salt on walking paths during winter.  Clean up any spills in your garage right away. This includes oil or grease spills. What can I do in the bathroom?  Use night lights.  Install grab bars by the toilet and in the tub and shower. Do not use towel bars as grab bars.  Use non-skid mats or decals in the tub or shower.  If you need to sit down in the shower, use a plastic, non-slip stool.  Keep the floor dry. Clean up any water that spills on the floor as soon as it happens.  Remove soap buildup in the tub or shower  regularly.  Attach bath mats securely with double-sided non-slip rug tape.  Do not have throw rugs and other things on the floor that can make you trip. What can I do in the bedroom?  Use night lights.  Make sure that you have a light by your bed that is easy to reach.  Do not use any sheets or blankets that are too big for your bed. They should not hang down onto the floor.  Have a firm chair that has side arms. You can use this for support while you get dressed.  Do not have throw rugs and other things on the floor that can make you trip. What can I do in the kitchen?  Clean up any spills right away.  Avoid walking on wet floors.  Keep items that you use a lot in easy-to-reach places.  If you need to reach something above you, use a strong step stool that has a grab bar.  Keep electrical cords out of the way.  Do not use floor polish or wax that makes floors slippery. If you must use wax, use non-skid floor wax.  Do not have throw rugs and other things  on the floor that can make you trip. What can I do with my stairs?  Do not leave any items on the stairs.  Make sure that there are handrails on both sides of the stairs and use them. Fix handrails that are broken or loose. Make sure that handrails are as long as the stairways.  Check any carpeting to make sure that it is firmly attached to the stairs. Fix any carpet that is loose or worn.  Avoid having throw rugs at the top or bottom of the stairs. If you do have throw rugs, attach them to the floor with carpet tape.  Make sure that you have a light switch at the top of the stairs and the bottom of the stairs. If you do not have them, ask someone to add them for you. What else can I do to help prevent falls?  Wear shoes that:  Do not have high heels.  Have rubber bottoms.  Are comfortable and fit you well.  Are closed at the toe. Do not wear sandals.  If you use a stepladder:  Make sure that it is fully  opened. Do not climb a closed stepladder.  Make sure that both sides of the stepladder are locked into place.  Ask someone to hold it for you, if possible.  Clearly mark and make sure that you can see:  Any grab bars or handrails.  First and last steps.  Where the edge of each step is.  Use tools that help you move around (mobility aids) if they are needed. These include:  Canes.  Walkers.  Scooters.  Crutches.  Turn on the lights when you go into a dark area. Replace any light bulbs as soon as they burn out.  Set up your furniture so you have a clear path. Avoid moving your furniture around.  If any of your floors are uneven, fix them.  If there are any pets around you, be aware of where they are.  Review your medicines with your doctor. Some medicines can make you feel dizzy. This can increase your chance of falling. Ask your doctor what other things that you can do to help prevent falls. This information is not intended to replace advice given to you by your health care provider. Make sure you discuss any questions you have with your health care provider. Document Released: 11/08/2008 Document Revised: 06/20/2015 Document Reviewed: 02/16/2014 Elsevier Interactive Patient Education  2017 [employer].

## 2018-02-07 LAB — COMPREHENSIVE METABOLIC PANEL
ALT: 28 IU/L (ref 0–44)
AST: 46 IU/L — AB (ref 0–40)
Albumin/Globulin Ratio: 1.7 (ref 1.2–2.2)
Albumin: 4.5 g/dL (ref 3.6–4.8)
Alkaline Phosphatase: 101 IU/L (ref 39–117)
BILIRUBIN TOTAL: 0.5 mg/dL (ref 0.0–1.2)
BUN / CREAT RATIO: 15 (ref 10–24)
BUN: 22 mg/dL (ref 8–27)
CHLORIDE: 105 mmol/L (ref 96–106)
CO2: 18 mmol/L — AB (ref 20–29)
CREATININE: 1.48 mg/dL — AB (ref 0.76–1.27)
Calcium: 9.9 mg/dL (ref 8.6–10.2)
GFR calc Af Amer: 55 mL/min/{1.73_m2} — ABNORMAL LOW (ref >59)
GFR calc non Af Amer: 48 mL/min/{1.73_m2} — ABNORMAL LOW (ref >59)
GLUCOSE: 90 mg/dL (ref 65–99)
Globulin, Total: 2.7 g/dL (ref 1.5–4.5)
Potassium: 4.7 mmol/L (ref 3.5–5.2)
SODIUM: 144 mmol/L (ref 134–144)
Total Protein: 7.2 g/dL (ref 6.0–8.5)

## 2018-02-07 LAB — CBC
Hematocrit: 45.7 % (ref 37.5–51.0)
Hemoglobin: 15.6 g/dL (ref 13.0–17.7)
MCH: 29.3 pg (ref 26.6–33.0)
MCHC: 34.1 g/dL (ref 31.5–35.7)
MCV: 86 fL (ref 79–97)
PLATELETS: 79 10*3/uL — AB (ref 150–450)
RBC: 5.32 x10E6/uL (ref 4.14–5.80)
RDW: 14 % (ref 11.6–15.4)
WBC: 5.4 10*3/uL (ref 3.4–10.8)

## 2018-02-07 LAB — HEPATITIS C ANTIBODY: Hep C Virus Ab: <0.1 s/co ratio (ref 0.0–0.9)

## 2018-02-07 LAB — TSH: TSH: 1.23 u[IU]/mL (ref 0.450–4.500)

## 2018-02-07 LAB — PSA: Prostate Specific Ag, Serum: 2.7 ng/mL (ref 0.0–4.0)

## 2018-03-02 DIAGNOSIS — R69 Illness, unspecified: Secondary | ICD-10-CM | POA: Diagnosis not present

## 2018-03-22 ENCOUNTER — Other Ambulatory Visit: Payer: Medicare HMO

## 2018-03-22 DIAGNOSIS — R799 Abnormal finding of blood chemistry, unspecified: Secondary | ICD-10-CM

## 2018-03-22 LAB — CBC WITH DIFFERENTIAL/PLATELET
BASOS ABS: 0 10*3/uL (ref 0.0–0.2)
Basos: 1 %
EOS (ABSOLUTE): 0.1 10*3/uL (ref 0.0–0.4)
EOS: 2 %
HEMATOCRIT: 45.4 % (ref 37.5–51.0)
HEMOGLOBIN: 15.4 g/dL (ref 13.0–17.7)
IMMATURE GRANULOCYTES: 0 %
Immature Grans (Abs): 0 10*3/uL (ref 0.0–0.1)
LYMPHS ABS: 1.5 10*3/uL (ref 0.7–3.1)
LYMPHS: 26 %
MCH: 29.2 pg (ref 26.6–33.0)
MCHC: 33.9 g/dL (ref 31.5–35.7)
MCV: 86 fL (ref 79–97)
MONOCYTES: 10 %
Monocytes Absolute: 0.5 10*3/uL (ref 0.1–0.9)
NEUTROS PCT: 61 %
Neutrophils Absolute: 3.5 10*3/uL (ref 1.4–7.0)
Platelets: 242 10*3/uL (ref 150–450)
RBC: 5.28 x10E6/uL (ref 4.14–5.80)
RDW: 13.7 % (ref 11.6–15.4)
WBC: 5.7 10*3/uL (ref 3.4–10.8)

## 2018-03-24 DIAGNOSIS — Z6830 Body mass index (BMI) 30.0-30.9, adult: Secondary | ICD-10-CM | POA: Diagnosis not present

## 2018-03-24 DIAGNOSIS — I129 Hypertensive chronic kidney disease with stage 1 through stage 4 chronic kidney disease, or unspecified chronic kidney disease: Secondary | ICD-10-CM | POA: Diagnosis not present

## 2018-03-24 DIAGNOSIS — N183 Chronic kidney disease, stage 3 (moderate): Secondary | ICD-10-CM | POA: Diagnosis not present

## 2018-03-24 DIAGNOSIS — R5383 Other fatigue: Secondary | ICD-10-CM | POA: Diagnosis not present

## 2018-04-21 ENCOUNTER — Other Ambulatory Visit: Payer: Self-pay

## 2018-04-21 ENCOUNTER — Ambulatory Visit (INDEPENDENT_AMBULATORY_CARE_PROVIDER_SITE_OTHER): Payer: Medicare HMO | Admitting: *Deleted

## 2018-04-21 DIAGNOSIS — I495 Sick sinus syndrome: Secondary | ICD-10-CM | POA: Diagnosis not present

## 2018-04-22 ENCOUNTER — Telehealth: Payer: Self-pay

## 2018-04-22 NOTE — Telephone Encounter (Signed)
Left message for patient to remind of missed remote transmission.  

## 2018-04-23 LAB — CUP PACEART REMOTE DEVICE CHECK
Battery Remaining Longevity: 102 mo
Brady Statistic AP VS Percent: 92 %
Brady Statistic AS VP Percent: 0 %
Brady Statistic AS VS Percent: 8 %
Implantable Lead Implant Date: 20131122
Implantable Lead Location: 753860
Implantable Lead Model: 5594
Lead Channel Impedance Value: 430 Ohm
Lead Channel Pacing Threshold Amplitude: 0.5 V
Lead Channel Pacing Threshold Amplitude: 0.625 V
Lead Channel Pacing Threshold Pulse Width: 0.4 ms
Lead Channel Setting Pacing Amplitude: 1.5 V
Lead Channel Setting Pacing Amplitude: 2 V
Lead Channel Setting Sensing Sensitivity: 2.8 mV
MDC IDC LEAD IMPLANT DT: 20050106
MDC IDC LEAD LOCATION: 753859
MDC IDC MSMT BATTERY IMPEDANCE: 453 Ohm
MDC IDC MSMT BATTERY VOLTAGE: 2.78 V
MDC IDC MSMT LEADCHNL RA PACING THRESHOLD PULSEWIDTH: 0.4 ms
MDC IDC MSMT LEADCHNL RV IMPEDANCE VALUE: 710 Ohm
MDC IDC PG IMPLANT DT: 20131122
MDC IDC SESS DTM: 20200327143758
MDC IDC SET LEADCHNL RV PACING PULSEWIDTH: 0.4 ms
MDC IDC STAT BRADY AP VP PERCENT: 0 %

## 2018-04-28 ENCOUNTER — Encounter: Payer: Self-pay | Admitting: Cardiology

## 2018-04-28 NOTE — Progress Notes (Signed)
Remote pacemaker transmission.   

## 2018-07-21 ENCOUNTER — Ambulatory Visit (INDEPENDENT_AMBULATORY_CARE_PROVIDER_SITE_OTHER): Payer: Medicare HMO | Admitting: *Deleted

## 2018-07-21 DIAGNOSIS — I495 Sick sinus syndrome: Secondary | ICD-10-CM | POA: Diagnosis not present

## 2018-07-22 LAB — CUP PACEART REMOTE DEVICE CHECK
Battery Impedance: 477 Ohm
Battery Remaining Longevity: 100 mo
Battery Voltage: 2.77 V
Brady Statistic AP VP Percent: 0 %
Brady Statistic AP VS Percent: 92 %
Brady Statistic AS VP Percent: 0 %
Brady Statistic AS VS Percent: 8 %
Date Time Interrogation Session: 20200625165516
Implantable Lead Implant Date: 20050106
Implantable Lead Implant Date: 20131122
Implantable Lead Location: 753859
Implantable Lead Location: 753860
Implantable Lead Model: 5092
Implantable Lead Model: 5594
Implantable Pulse Generator Implant Date: 20131122
Lead Channel Impedance Value: 437 Ohm
Lead Channel Impedance Value: 673 Ohm
Lead Channel Pacing Threshold Amplitude: 0.5 V
Lead Channel Pacing Threshold Amplitude: 0.625 V
Lead Channel Pacing Threshold Pulse Width: 0.4 ms
Lead Channel Pacing Threshold Pulse Width: 0.4 ms
Lead Channel Setting Pacing Amplitude: 1.5 V
Lead Channel Setting Pacing Amplitude: 2 V
Lead Channel Setting Pacing Pulse Width: 0.4 ms
Lead Channel Setting Sensing Sensitivity: 2.8 mV

## 2018-08-01 ENCOUNTER — Encounter: Payer: Self-pay | Admitting: Cardiology

## 2018-08-01 NOTE — Progress Notes (Signed)
Remote pacemaker transmission.   

## 2018-08-04 ENCOUNTER — Other Ambulatory Visit: Payer: Self-pay

## 2018-08-04 ENCOUNTER — Ambulatory Visit (INDEPENDENT_AMBULATORY_CARE_PROVIDER_SITE_OTHER): Payer: Medicare HMO | Admitting: Nurse Practitioner

## 2018-08-04 ENCOUNTER — Encounter: Payer: Self-pay | Admitting: Nurse Practitioner

## 2018-08-04 VITALS — BP 120/70 | HR 75 | Temp 98.3°F | Wt 219.2 lb

## 2018-08-04 DIAGNOSIS — I10 Essential (primary) hypertension: Secondary | ICD-10-CM | POA: Diagnosis not present

## 2018-08-04 DIAGNOSIS — Z950 Presence of cardiac pacemaker: Secondary | ICD-10-CM

## 2018-08-04 DIAGNOSIS — R5382 Chronic fatigue, unspecified: Secondary | ICD-10-CM

## 2018-08-04 DIAGNOSIS — R7309 Other abnormal glucose: Secondary | ICD-10-CM | POA: Diagnosis not present

## 2018-08-04 DIAGNOSIS — I495 Sick sinus syndrome: Secondary | ICD-10-CM

## 2018-08-04 MED ORDER — CANDESARTAN CILEXETIL-HCTZ 32-25 MG PO TABS: 1.0000 | ORAL_TABLET | Freq: Every day | ORAL | 1 refills | Status: DC

## 2018-08-04 MED ORDER — AMLODIPINE BESYLATE 10 MG PO TABS: 10.0000 mg | ORAL_TABLET | Freq: Every day | ORAL | 1 refills | Status: DC

## 2018-08-04 NOTE — Progress Notes (Signed)
Subjective:     Patient ID: Aaron Snyder , male    DOB: 1950-03-06 , 68 y.o.   MRN: [id number]   Chief Complaint  Patient presents with  . Hypertension  . Back Pain    patient would like a rx for generic of voltaren   Men's preventive visit. Patient Health Questionnaire (PHQ-2) is    Office Visit from 08/04/2018 in [facility]  PHQ-2 Total Score  0     Patient is on a regular diet. Marital status: Widowed. Relevant history for alcohol use is:  Social History   Substance and Sexual Activity  Alcohol Use No   Relevant history for tobacco use is:  Social History   Tobacco Use  Smoking Status Former Smoker  . Packs/day: 1.00  . Years: 15.00  . Pack years: 15.00  . Types: Cigarettes  . Quit date: 03/15/1996  . Years since quitting: 22.4  Smokeless Tobacco Never Used  . HPI  Continues to have his chronic fatigue  Hypertension This is a chronic problem. The current episode started more than 1 year ago. The problem is unchanged. The problem is controlled. Pertinent negatives include no anxiety, chest pain, headaches or palpitations. There are no associated agents to hypertension. Risk factors for coronary artery disease include dyslipidemia. Past treatments include ACE inhibitors and diuretics. There are no compliance problems.  There is no history of kidney disease. There is no history of chronic renal disease.  Back Pain Pertinent negatives include no chest pain or headaches.     Past Medical History:  Diagnosis Date  . Coronary artery disease   . Dysphagia   . Head injury   . Hypertension   . Pacemaker generator end of life 12/18/2011   [employer]  . Presence of permanent cardiac pacemaker 02/01/2003   [employer]  . Syncope      Family History  Problem Relation Age of Onset  . Diabetes Father   . Hypertension Father      Current Outpatient Medications:  .  aspirin EC 81 MG tablet, Take 81 mg by mouth daily., Disp: , Rfl:  .   loratadine (CLARITIN) 10 MG tablet, Take 10 mg by mouth daily., Disp: , Rfl:  .  Multiple Vitamins-Minerals (MULTIVITAMIN WITH MINERALS) tablet, Take 1 tablet by mouth daily., Disp: , Rfl:  .  prednisoLONE acetate (PRED FORTE) 1 % ophthalmic suspension, Place 1 drop into the left eye 4 (four) times daily., Disp: 5 mL, Rfl: 1 .  Zinc 50 MG CAPS, Take 1 capsule by mouth daily., Disp: , Rfl:  .  amLODipine (NORVASC) 10 MG tablet, Take 1 tablet (10 mg total) by mouth daily., Disp: 90 tablet, Rfl: 1 .  Candesartan Cilexetil-HCTZ 32-25 MG TABS, Take 1 tablet by mouth daily., Disp: 90 tablet, Rfl: 1   Allergies  Allergen Reactions  . Apple Anaphylaxis  . Carrot [Daucus Carota] Anaphylaxis  . Iodinated Diagnostic Agents Shortness Of Breath and Swelling    PER PATIENT BREAK THROUGH REACTION WITH PRE MEDS-2016-----Can have contrast as long as he has a 13 prep  . Iohexol Shortness Of Breath and Swelling    Pt requires 13hr premeds.  . Oat Anaphylaxis  . Soy Allergy Anaphylaxis  . Wheat Anaphylaxis  . Almond Meal Itching  . Cow's Milk [Lac Bovis]     Hives, and upset stomach      Review of Systems  Constitutional: Positive for fatigue.  HENT: Negative.   Eyes: Negative.   Respiratory: Negative.  Cardiovascular: Negative.  Negative for chest pain, palpitations and leg swelling.  Gastrointestinal: Negative.   Endocrine: Negative for polydipsia, polyphagia and polyuria.  Genitourinary: Negative.   Musculoskeletal: Positive for back pain.  Skin: Negative.   Allergic/Immunologic: Negative.   Neurological: Negative for dizziness, syncope and headaches.  Hematological: Negative.   Psychiatric/Behavioral: Negative.      Today's Vitals   08/04/18 0844  BP: 120/70  Pulse: 75  Temp: 98.3 F (36.8 C)  TempSrc: Oral  Weight: 219 lb 3.2 oz (99.4 kg)  PainSc: 8   PainLoc: Back   Body mass index is 30.92 kg/m.   Objective:  Physical Exam Vitals signs reviewed.  Constitutional:       General: He is not in acute distress.    Appearance: Normal appearance. He is not ill-appearing.  HENT:     Head: Normocephalic and atraumatic.     Right Ear: External ear normal.     Left Ear: External ear normal.     Nose: Nose normal. No congestion.     Mouth/Throat:     Mouth: Mucous membranes are moist.  Eyes:     Extraocular Movements: Extraocular movements intact.     Conjunctiva/sclera: Conjunctivae normal.     Pupils: Pupils are equal, round, and reactive to light.  Neck:     Musculoskeletal: Normal range of motion and neck supple. No muscular tenderness.  Cardiovascular:     Rate and Rhythm: Normal rate and regular rhythm.     Pulses: Normal pulses.     Heart sounds: Normal heart sounds.  Pulmonary:     Effort: Pulmonary effort is normal.     Breath sounds: Normal breath sounds.  Abdominal:     General: Abdomen is flat.     Palpations: Abdomen is soft.  Musculoskeletal: Normal range of motion.  Skin:    General: Skin is warm and dry.     Capillary Refill: Capillary refill takes less than 2 seconds.  Neurological:     General: No focal deficit present.     Mental Status: He is alert.  Psychiatric:        Mood and Affect: Mood normal.        Behavior: Behavior normal.        Thought Content: Thought content normal.        Judgment: Judgment normal.         Assessment And Plan:     1. Chronic fatigue  He is to have a sleep study done to see if this is the cause of his fatigue  2. Essential hypertension . B/P is well controlled.  . CMP ordered to check renal function.  . The importance of regular exercise and dietary modification was stressed to the patient.  - CMP14 + Anion Gap - CBC no Diff  3. Chronotropic incompetence with sinus node dysfunction [facility])  He has a pacemaker in place and Dr Benn Moulder does not feel this is the cause to his fatigue  4. Abnormal glucose  Chronic, stable  No current medications  Encouraged to limit intake of sugary  foods and drinks  Encouraged to increase physical activity to 150 minutes per week as tolerated - Hemoglobin A1c - CMP14 + Anion Gap      [name], FNP

## 2018-08-05 LAB — CMP14 + ANION GAP
ALT: 20 IU/L (ref 0–44)
AST: 28 IU/L (ref 0–40)
Albumin/Globulin Ratio: 2.3 — ABNORMAL HIGH (ref 1.2–2.2)
Albumin: 4.5 g/dL (ref 3.8–4.8)
Alkaline Phosphatase: 88 IU/L (ref 39–117)
Anion Gap: 16 mmol/L (ref 10.0–18.0)
BUN/Creatinine Ratio: 13 (ref 10–24)
BUN: 19 mg/dL (ref 8–27)
Bilirubin Total: 0.4 mg/dL (ref 0.0–1.2)
CO2: 23 mmol/L (ref 20–29)
Calcium: 9.6 mg/dL (ref 8.6–10.2)
Chloride: 104 mmol/L (ref 96–106)
Creatinine, Ser: 1.41 mg/dL — ABNORMAL HIGH (ref 0.76–1.27)
GFR calc Af Amer: 58 mL/min/{1.73_m2} — ABNORMAL LOW (ref >59)
GFR calc non Af Amer: 50 mL/min/{1.73_m2} — ABNORMAL LOW (ref >59)
Globulin, Total: 2 g/dL (ref 1.5–4.5)
Glucose: 96 mg/dL (ref 65–99)
Potassium: 4.1 mmol/L (ref 3.5–5.2)
Sodium: 143 mmol/L (ref 134–144)
Total Protein: 6.5 g/dL (ref 6.0–8.5)

## 2018-08-05 LAB — CBC
Hematocrit: 47.6 % (ref 37.5–51.0)
Hemoglobin: 15.4 g/dL (ref 13.0–17.7)
MCH: 28.5 pg (ref 26.6–33.0)
MCHC: 32.4 g/dL (ref 31.5–35.7)
MCV: 88 fL (ref 79–97)
Platelets: 211 10*3/uL (ref 150–450)
RBC: 5.4 x10E6/uL (ref 4.14–5.80)
RDW: 14.1 % (ref 11.6–15.4)
WBC: 4.8 10*3/uL (ref 3.4–10.8)

## 2018-08-05 LAB — HEMOGLOBIN A1C
Est. average glucose Bld gHb Est-mCnc: 117 mg/dL
Hgb A1c MFr Bld: 5.7 % — ABNORMAL HIGH (ref 4.8–5.6)

## 2018-08-19 ENCOUNTER — Encounter: Payer: Self-pay | Admitting: Nurse Practitioner

## 2018-10-20 ENCOUNTER — Ambulatory Visit (INDEPENDENT_AMBULATORY_CARE_PROVIDER_SITE_OTHER): Payer: Medicare HMO | Admitting: *Deleted

## 2018-10-20 DIAGNOSIS — I495 Sick sinus syndrome: Secondary | ICD-10-CM | POA: Diagnosis not present

## 2018-10-20 LAB — CUP PACEART REMOTE DEVICE CHECK
Battery Impedance: 527 Ohm
Battery Remaining Longevity: 95 mo
Battery Voltage: 2.78 V
Brady Statistic AP VP Percent: 0 %
Brady Statistic AP VS Percent: 92 %
Brady Statistic AS VP Percent: 0 %
Brady Statistic AS VS Percent: 8 %
Date Time Interrogation Session: 20200924124431
Implantable Lead Implant Date: 20050106
Implantable Lead Implant Date: 20131122
Implantable Lead Location: 753859
Implantable Lead Location: 753860
Implantable Lead Model: 5092
Implantable Lead Model: 5594
Implantable Pulse Generator Implant Date: 20131122
Lead Channel Impedance Value: 420 Ohm
Lead Channel Impedance Value: 727 Ohm
Lead Channel Pacing Threshold Amplitude: 0.5 V
Lead Channel Pacing Threshold Amplitude: 0.625 V
Lead Channel Pacing Threshold Pulse Width: 0.4 ms
Lead Channel Pacing Threshold Pulse Width: 0.4 ms
Lead Channel Setting Pacing Amplitude: 1.5 V
Lead Channel Setting Pacing Amplitude: 2 V
Lead Channel Setting Pacing Pulse Width: 0.4 ms
Lead Channel Setting Sensing Sensitivity: 4 mV

## 2018-10-28 ENCOUNTER — Encounter: Payer: Self-pay | Admitting: Cardiology

## 2018-10-28 NOTE — Progress Notes (Signed)
Remote pacemaker transmission.   

## 2018-11-08 DIAGNOSIS — R69 Illness, unspecified: Secondary | ICD-10-CM | POA: Diagnosis not present

## 2018-11-15 ENCOUNTER — Encounter: Payer: Self-pay | Admitting: Internal Medicine

## 2018-12-29 ENCOUNTER — Other Ambulatory Visit: Payer: Self-pay

## 2018-12-29 ENCOUNTER — Encounter: Payer: Self-pay | Admitting: Cardiovascular Disease

## 2018-12-29 ENCOUNTER — Ambulatory Visit (INDEPENDENT_AMBULATORY_CARE_PROVIDER_SITE_OTHER): Payer: Medicare HMO | Admitting: Cardiovascular Disease

## 2018-12-29 VITALS — BP 135/81 | HR 97 | Temp 96.6°F | Ht 71.0 in | Wt 225.6 lb

## 2018-12-29 DIAGNOSIS — I10 Essential (primary) hypertension: Secondary | ICD-10-CM | POA: Diagnosis not present

## 2018-12-29 DIAGNOSIS — I495 Sick sinus syndrome: Secondary | ICD-10-CM

## 2018-12-29 DIAGNOSIS — I2510 Atherosclerotic heart disease of native coronary artery without angina pectoris: Secondary | ICD-10-CM

## 2018-12-29 DIAGNOSIS — Z950 Presence of cardiac pacemaker: Secondary | ICD-10-CM

## 2018-12-29 DIAGNOSIS — N1831 Chronic kidney disease, stage 3a: Secondary | ICD-10-CM | POA: Diagnosis not present

## 2018-12-29 DIAGNOSIS — I712 Thoracic aortic aneurysm, without rupture, unspecified: Secondary | ICD-10-CM

## 2018-12-29 NOTE — Progress Notes (Signed)
Patient ID: Aaron Snyder, male   DOB: [birth year], 68 y.o.   MRN: [id number]    Cardiology Office Note    Date:  12/29/2018   ID:  Asaad, Wohler 05-02-1950, MRN [id number]  PCP:  [name], FNP  Cardiologist:   Sanda Klein, MD   No chief complaint on file.   History of Present Illness:  Aaron Snyder is a 68 y.o. male who returns for follow-up on his pacemaker, implanted in 2013 for syncope related to sinus node dysfunction with pauses.  He has had fatigue related to chronotropic incompetence, fairly well with improvement with rate response sensor.  He has occasional brief sensations of burning in his midsternal area.  During interrogation of his device today the seem to correlate with occasional PACs. 99% of the time his rhythm is atrial paced, ventricular sensed.  His device has recorded a single 12 beat run of nonsustained VT in the last 12 months (in July).  He has not had atrial fibrillation.  Lead parameters remain excellent.  Heart rate histogram distribution is appropriate.  The patient specifically denies any chest pain at rest exertion, dyspnea at rest or with exertion, orthopnea, paroxysmal nocturnal dyspnea, syncope, palpitations, focal neurological deficits, intermittent claudication, lower extremity edema, unexplained weight gain, cough, hemoptysis or wheezing.  He continues to exercise regularly, mostly walking in the very early morning hours.  She has gained 5 pounds during the coronavirus pandemic.  In late 2016, he noticed a reduction in exercise tolerance and his nuclear perfusion study appeared to show a large, new inferior wall scar. Coronary angiography actually showed very minor coronary atherosclerotic lesions (max stenosis 25%) as well as normal left ventricular regional and global systolic function.  We brought him back for a treadmill stress test in October 2017 and adjusted his pacemaker sensor settings.  This led to a remarkable improvement in  exercise capacity.  He was worried about an abnormality detected on the chest x-ray, but chest CT showed that this was actually projection of a chronic benign rib abnormality.  He does have some emphysema changes in his lungs.  He quit smoking 20 years ago.  Incidental note was made of minor aneurysmal dilatation of the distal aortic arch and descending thoracic aorta with diameter of 3.8-3.9 cm.  Coronary angiography has shown no progression of disease from 2005 to 2013 to 2016. Has a a history of head injury and cervical spine problems, GERD, pneumonia 2016.     Past Medical History:  Diagnosis Date   Coronary artery disease    Dysphagia    Head injury    Hypertension    Pacemaker generator end of life 12/18/2011   Medtronic   Presence of permanent cardiac pacemaker 02/01/2003   Medtronic   Syncope     Past Surgical History:  Procedure Laterality Date   CARDIAC CATHETERIZATION N/A 12/26/2014   Procedure: Left Heart Cath and Coronary Angiography;  Surgeon: [name], MD;  Location: [facility];  Service: Cardiovascular;  Laterality: N/A;   ESOPHAGOGASTRODUODENOSCOPY     [place]   LEFT AND RIGHT HEART CATHETERIZATION WITH CORONARY ANGIOGRAM N/A 12/17/2011   Procedure: LEFT AND RIGHT HEART CATHETERIZATION WITH CORONARY ANGIOGRAM;  Surgeon: [name], MD;  Location: [facility] CATH LAB;  Service: Cardiovascular;  Laterality: N/A;   LOOP RECORDER IMPLANT/EXPLANT  2005   NM MYOCAR PERF WALL MOTION  01/2011   Negative   PACEMAKER GENERATOR CHANGE  12/18/2011   Medtronic   PACEMAKER  INSERTION  2005   Medtronic   PERMANENT PACEMAKER GENERATOR CHANGE N/A 12/18/2011   Procedure: PERMANENT PACEMAKER GENERATOR CHANGE;  Surgeon: [name], MD;  Location[facility] C[facility] LAB;  Service: Cardiovascular;  Laterality: N/A;   TOTAL SHOULDER ARTHROPLASTY      Outpatient Medications Prior to Visit  Medication Sig Dispense Refill   amLODipine (NORVASC) 10 MG tablet Take 1  tablet (10 mg total) by mouth daily. 90 tablet 1   aspirin EC 81 MG tablet Take 81 mg by mouth daily.     Candesartan Cilexetil-HCTZ 32-25 MG TABS Take 1 tablet by mouth daily. 90 tablet 1   loratadine (CLARITIN) 10 MG tablet Take 10 mg by mouth daily.     Multiple Vitamins-Minerals (MULTIVITAMIN WITH MINERALS) tablet Take 1 tablet by mouth daily.     prednisoLONE acetate (PRED FORTE) 1 % ophthalmic suspension Place 1 drop into the left eye 4 (four) times daily. 5 mL 1   Zinc 50 MG CAPS Take 1 capsule by mouth daily.     No facility-administered medications prior to visit.      Allergies:   Apple, Carrot [daucus carota], Iodinated diagnostic agents, Iohexol, Oat, Soy allergy, Wheat, Almond meal, and Cow's milk [lac bovis]   Social History   Socioeconomic History   Marital status: Widowed    Spouse name: Not on file   Number of children: Not on file   Years of education: Not on file   Highest education level: Not on file  Occupational History   Occupation: Disabled    Employer: UNEMPLOYED  Social Designer, fashion/clothing strain: Not hard at all   Food insecurity    Worry: Never true    Inability: Never true   Transportation needs    Medical: No    Non-medical: No  Tobacco Use   Smoking status: Former Smoker    Packs/day: 1.00    Years: 15.00    Pack years: 15.00    Types: Cigarettes    Quit date: 03/15/1996    Years since quitting: 22.8   Smokeless tobacco: Never Used  Substance and Sexual Activity   Alcohol use: No   Drug use: No   Sexual activity: Not Currently  Lifestyle   Physical activity    Days per week: 5 days    Minutes per session: 90 min   Stress: Not at all  Relationships   Social connections    Talks on phone: Not on file    Gets together: Not on file    Attends religious service: Not on file    Active member of club or organization: Not on file    Attends meetings of clubs or organizations: Not on file    Relationship  status: Not on file  Other Topics Concern   Not on file  Social History Narrative   Not on file      ROS:   Please see the history of present illness.    ROS All other systems are reviewed and are negative.   PHYSICAL EXAM:   VS:  BP 135/81    Pulse 97    Temp (!) 96.6 F (35.9 C)    Ht 5\' 11"  (1.803 m)    Wt 225 lb 9.6 oz (102.3 kg)    SpO2 99%    BMI 31.46 kg/m      General: Alert, oriented x3, no distress, appears younger than stated age and quite fit Head: no evidence of trauma, PERRL, EOMI, no  exophtalmos or lid lag, no myxedema, no xanthelasma; normal ears, nose and oropharynx Neck: normal jugular venous pulsations and no hepatojugular reflux; brisk carotid pulses without delay and no carotid bruits Chest: clear to auscultation, no signs of consolidation by percussion or palpation, normal fremitus, symmetrical and full respiratory excursions Cardiovascular: normal position and quality of the apical impulse, regular rhythm, normal first and second heart sounds, no murmurs, rubs or gallops Abdomen: no tenderness or distention, no masses by palpation, no abnormal pulsatility or arterial bruits, normal bowel sounds, no hepatosplenomegaly Extremities: no clubbing, cyanosis or edema; 2+ radial, ulnar and brachial pulses bilaterally; 2+ right femoral, posterior tibial and dorsalis pedis pulses; 2+ left femoral, posterior tibial and dorsalis pedis pulses; no subclavian or femoral bruits Neurological: grossly nonfocal Psych: Normal mood and affect   Wt Readings from Last 3 Encounters:  12/29/18 225 lb 9.6 oz (102.3 kg)  08/04/18 219 lb 3.2 oz (99.4 kg)  02/03/18 216 lb 6.4 oz (98.2 kg)      Studies/Labs Reviewed:   EKG:  EKG is ordered today.  Shows atrial paced, ventricular sensed rhythm with mildly prolonged AV delay 210 ms and is otherwise normal tracing  Recent Labs: 02/03/2018: TSH 1.230 08/04/2018: ALT 20; BUN 19; Creatinine, Ser 1.41; Hemoglobin 15.4; Platelets 211;  Potassium 4.1; Sodium 143  Requested most recent labs from Dr. Baird Cancer office.  ASSESSMENT:    1. SSS (sick sinus syndrome) ([place])   2. Essential hypertension   3. Coronary artery disease involving native coronary artery of native heart without angina pectoris   4. Thoracic aortic aneurysm without rupture (HCC)   5. Stage 3a chronic kidney disease   6. Pacemaker - dual chamber Medtronic 2004, new generator 2013     PLAN:  In order of problems listed above:  1. SSS: Heart rate histogram distribution suggests appropriate sensor settings.  He still complains of fatigue but I do not think essentially attributed to his heart rates. 2. CAD: He does not have angina pectoris despite being quite active.  Mild plaque by previous angiography.; chest pain has led to angiography on 3 different occasions, most recently December 2016, without any evidence of progression of coronary disease and without significant obstructive lesions. 3. HTN: Well-controlled, on 3 different agents. 4. PPM: Continue remote downloads every 3 months. 5. Ao aneurysm: Very mild dilation of the distal arch/descending thoracic aorta.  Would probably reevaluate this symptom about 5 years from the initial CT, unless he develops new symptoms.  Prefer not to monitor with CT angiography since this would involve contrast exposure.  MRI is possible also related to issues related to his pacemaker (his leads are not MRI conditional).  Another option would be TEE, since the area of abnormality should be readily visible. 6. CKD stage 2-3: Now seeing Dr. Moshe Cipro after Dr. Etheleen Nicks retirement.  Most recent creatinine 1.4    Medication Adjustments/Labs and Tests Ordered: Current medicines are reviewed at length with the patient today.  Concerns regarding medicines are outlined above.  Medication changes, Labs and Tests ordered today are listed in the Patient Instructions below. Patient Instructions  Medication Instructions:  No  changes *If you need a refill on your cardiac medications before your next appointment, please call your pharmacy*  Lab Work: None ordered If you have labs (blood work) drawn today and your tests are completely normal, you will receive your results only by:  [place] (if you have MyChart) OR  A paper copy in the mail If you have any  lab test that is abnormal or we need to change your treatment, we will call you to review the results.  Testing/Procedures: None ordered  Follow-Up: At [facility], you and your health needs are our priority.  As part of our continuing mission to provide you with exceptional heart care, we have created designated Provider Care Teams.  These Care Teams include your primary Cardiologist (physician) and Advanced Practice Providers (APPs -  Physician Assistants and Nurse Practitioners) who all work together to provide you with the care you need, when you need it.  Your next appointment:   12 month(s)  The format for your next appointment:   Either In Person or Virtual  Provider:   Sanda Klein, MD        Signed, Sanda Klein, MD  12/29/2018 8:34 AM    [address], [place], [place]  [address] Phone: [phone]; Fax: [phone]

## 2018-12-29 NOTE — Patient Instructions (Signed)
Medication Instructions:  No changes *If you need a refill on your cardiac medications before your next appointment, please call your pharmacy*  Lab Work: None ordered If you have labs (blood work) drawn today and your tests are completely normal, you will receive your results only by: . MyChart Message (if you have MyChart) OR . A paper copy in the mail If you have any lab test that is abnormal or we need to change your treatment, we will call you to review the results.  Testing/Procedures: None ordered  Follow-Up: At [facility], you and your health needs are our priority.  As part of our continuing mission to provide you with exceptional heart care, we have created designated Provider Care Teams.  These Care Teams include your primary Cardiologist (physician) and Advanced Practice Providers (APPs -  Physician Assistants and Nurse Practitioners) who all work together to provide you with the care you need, when you need it.  Your next appointment:   12 month(s)  The format for your next appointment:   Either In Person or Virtual  Provider:   Mihai Croitoru, MD    

## 2019-01-19 ENCOUNTER — Ambulatory Visit (INDEPENDENT_AMBULATORY_CARE_PROVIDER_SITE_OTHER): Payer: Medicare HMO | Admitting: *Deleted

## 2019-01-19 DIAGNOSIS — Z950 Presence of cardiac pacemaker: Secondary | ICD-10-CM

## 2019-01-20 LAB — CUP PACEART REMOTE DEVICE CHECK
Battery Impedance: 579 Ohm
Battery Remaining Longevity: 93 mo
Battery Voltage: 2.77 V
Brady Statistic AP VP Percent: 0 %
Brady Statistic AP VS Percent: 90 %
Brady Statistic AS VP Percent: 0 %
Brady Statistic AS VS Percent: 9 %
Date Time Interrogation Session: 20201224124138
Implantable Lead Implant Date: 20050106
Implantable Lead Implant Date: 20131122
Implantable Lead Location: 753859
Implantable Lead Location: 753860
Implantable Lead Model: 5092
Implantable Lead Model: 5594
Implantable Pulse Generator Implant Date: 20131122
Lead Channel Impedance Value: 413 Ohm
Lead Channel Impedance Value: 650 Ohm
Lead Channel Pacing Threshold Amplitude: 0.5 V
Lead Channel Pacing Threshold Amplitude: 0.625 V
Lead Channel Pacing Threshold Pulse Width: 0.4 ms
Lead Channel Pacing Threshold Pulse Width: 0.4 ms
Lead Channel Setting Pacing Amplitude: 1.5 V
Lead Channel Setting Pacing Amplitude: 2 V
Lead Channel Setting Pacing Pulse Width: 0.4 ms
Lead Channel Setting Sensing Sensitivity: 2.8 mV

## 2019-01-23 ENCOUNTER — Other Ambulatory Visit: Payer: Self-pay | Admitting: Nurse Practitioner

## 2019-01-23 DIAGNOSIS — I10 Essential (primary) hypertension: Secondary | ICD-10-CM

## 2019-02-09 ENCOUNTER — Ambulatory Visit (INDEPENDENT_AMBULATORY_CARE_PROVIDER_SITE_OTHER): Payer: Medicare HMO

## 2019-02-09 ENCOUNTER — Other Ambulatory Visit: Payer: Self-pay

## 2019-02-09 ENCOUNTER — Encounter: Payer: Self-pay | Admitting: Nurse Practitioner

## 2019-02-09 ENCOUNTER — Ambulatory Visit (INDEPENDENT_AMBULATORY_CARE_PROVIDER_SITE_OTHER): Payer: Medicare HMO | Admitting: Nurse Practitioner

## 2019-02-09 VITALS — BP 138/78 | HR 100 | Temp 97.5°F | Ht 70.4 in | Wt 223.2 lb

## 2019-02-09 VITALS — BP 138/78 | HR 100 | Temp 97.5°F | Ht 70.4 in | Wt 223.0 lb

## 2019-02-09 DIAGNOSIS — Z23 Encounter for immunization: Secondary | ICD-10-CM | POA: Diagnosis not present

## 2019-02-09 DIAGNOSIS — I712 Thoracic aortic aneurysm, without rupture, unspecified: Secondary | ICD-10-CM

## 2019-02-09 DIAGNOSIS — I495 Sick sinus syndrome: Secondary | ICD-10-CM

## 2019-02-09 DIAGNOSIS — M545 Low back pain, unspecified: Secondary | ICD-10-CM

## 2019-02-09 DIAGNOSIS — I10 Essential (primary) hypertension: Secondary | ICD-10-CM

## 2019-02-09 DIAGNOSIS — Z125 Encounter for screening for malignant neoplasm of prostate: Secondary | ICD-10-CM

## 2019-02-09 DIAGNOSIS — G8929 Other chronic pain: Secondary | ICD-10-CM

## 2019-02-09 DIAGNOSIS — Z0000 Encounter for general adult medical examination without abnormal findings: Secondary | ICD-10-CM

## 2019-02-09 DIAGNOSIS — Z79899 Other long term (current) drug therapy: Secondary | ICD-10-CM

## 2019-02-09 DIAGNOSIS — R7309 Other abnormal glucose: Secondary | ICD-10-CM | POA: Diagnosis not present

## 2019-02-09 DIAGNOSIS — R0600 Dyspnea, unspecified: Secondary | ICD-10-CM

## 2019-02-09 DIAGNOSIS — R351 Nocturia: Secondary | ICD-10-CM | POA: Diagnosis not present

## 2019-02-09 DIAGNOSIS — R0609 Other forms of dyspnea: Secondary | ICD-10-CM

## 2019-02-09 DIAGNOSIS — Z950 Presence of cardiac pacemaker: Secondary | ICD-10-CM

## 2019-02-09 LAB — POCT UA - MICROALBUMIN
Albumin/Creatinine Ratio, Urine, POC: <30
Creatinine, POC: 200 mg/dL
Microalbumin Ur, POC: 10 mg/L

## 2019-02-09 LAB — POCT URINALYSIS DIPSTICK
Bilirubin, UA: NEGATIVE
Blood, UA: NEGATIVE
Glucose, UA: NEGATIVE
Ketones, UA: NEGATIVE
Leukocytes, UA: NEGATIVE
Nitrite, UA: NEGATIVE
Protein, UA: NEGATIVE
Spec Grav, UA: 1.02 (ref 1.010–1.025)
Urobilinogen, UA: 0.2 E.U./dL
pH, UA: 6.5 (ref 5.0–8.0)

## 2019-02-09 MED ORDER — CANDESARTAN CILEXETIL-HCTZ 32-25 MG PO TABS: 1.0000 | ORAL_TABLET | Freq: Every day | ORAL | 1 refills | Status: DC

## 2019-02-09 MED ORDER — AMLODIPINE BESYLATE 10 MG PO TABS: 10.0000 mg | ORAL_TABLET | Freq: Every day | ORAL | 1 refills | Status: DC

## 2019-02-09 MED ORDER — BOOSTRIX 5-2.5-18.5 LF-MCG/0.5 IM SUSP: 0.5000 mL | Freq: Once | INTRAMUSCULAR | 0 refills | Status: AC

## 2019-02-09 MED ORDER — BUDESONIDE-FORMOTEROL FUMARATE 80-4.5 MCG/ACT IN AERO: 2.0000 | INHALATION_SPRAY | Freq: Every day | RESPIRATORY_TRACT | 3 refills | Status: DC

## 2019-02-09 MED ORDER — ALBUTEROL SULFATE HFA 108 (90 BASE) MCG/ACT IN AERS: 2.0000 | INHALATION_SPRAY | Freq: Four times a day (QID) | RESPIRATORY_TRACT | 1 refills | Status: DC | PRN

## 2019-02-09 NOTE — Patient Instructions (Signed)
Aaron Snyder , Thank you for taking time to come for your Medicare Wellness Visit. I appreciate your ongoing commitment to your health goals. Please review the following plan we discussed and let me know if I can assist you in the future.   Screening recommendations/referrals: Colonoscopy: cologuard 2018 Recommended yearly ophthalmology/optometry visit for glaucoma screening and checkup Recommended yearly dental visit for hygiene and checkup  Vaccinations: Influenza vaccine: 10/2018 Pneumococcal vaccine: 02/2018 Tdap vaccine: sent to pharmacy Shingles vaccine: discussed    Advanced directives: Advance directive discussed with you today. I have provided a copy for you to complete at home and have notarized. Once this is complete please bring a copy in to our office so we can scan it into your chart.   Conditions/risks identified: obesity  Next appointment: 03/13/2019 at 8:30  Preventive Care 100 Years and Older, Male Preventive care refers to lifestyle choices and visits with your health care provider that can promote health and wellness. What does preventive care include?  A yearly physical exam. This is also called an annual well check.  Dental exams once or twice a year.  Routine eye exams. Ask your health care provider how often you should have your eyes checked.  Personal lifestyle choices, including:  Daily care of your teeth and gums.  Regular physical activity.  Eating a healthy diet.  Avoiding tobacco and drug use.  Limiting alcohol use.  Practicing safe sex.  Taking low doses of aspirin every day.  Taking vitamin and mineral supplements as recommended by your health care provider. What happens during an annual well check? The services and screenings done by your health care provider during your annual well check will depend on your age, overall health, lifestyle risk factors, and family history of disease. Counseling  Your health care provider may ask you  questions about your:  Alcohol use.  Tobacco use.  Drug use.  Emotional well-being.  Home and relationship well-being.  Sexual activity.  Eating habits.  History of falls.  Memory and ability to understand (cognition).  Work and work Statistician. Screening  You may have the following tests or measurements:  Height, weight, and BMI.  Blood pressure.  Lipid and cholesterol levels. These may be checked every 5 years, or more frequently if you are over 97 years old.  Skin check.  Lung cancer screening. You may have this screening every year starting at age 6 if you have a 30-pack-year history of smoking and currently smoke or have quit within the past 15 years.  Fecal occult blood test (FOBT) of the stool. You may have this test every year starting at age 31.  Flexible sigmoidoscopy or colonoscopy. You may have a sigmoidoscopy every 5 years or a colonoscopy every 10 years starting at age 28.  Prostate cancer screening. Recommendations will vary depending on your family history and other risks.  Hepatitis C blood test.  Hepatitis B blood test.  Sexually transmitted disease (STD) testing.  Diabetes screening. This is done by checking your blood sugar (glucose) after you have not eaten for a while (fasting). You may have this done every 1-3 years.  Abdominal aortic aneurysm (AAA) screening. You may need this if you are a current or former smoker.  Osteoporosis. You may be screened starting at age 69 if you are at high risk. Talk with your health care provider about your test results, treatment options, and if necessary, the need for more tests. Vaccines  Your health care provider may recommend certain vaccines, such  as:  Influenza vaccine. This is recommended every year.  Tetanus, diphtheria, and acellular pertussis (Tdap, Td) vaccine. You may need a Td booster every 10 years.  Zoster vaccine. You may need this after age 27.  Pneumococcal 13-valent conjugate  (PCV13) vaccine. One dose is recommended after age 19.  Pneumococcal polysaccharide (PPSV23) vaccine. One dose is recommended after age 36. Talk to your health care provider about which screenings and vaccines you need and how often you need them. This information is not intended to replace advice given to you by your health care provider. Make sure you discuss any questions you have with your health care provider. Document Released: 02/08/2015 Document Revised: 10/02/2015 Document Reviewed: 11/13/2014 Elsevier Interactive Patient Education  2017 [employer] Prevention in the Home Falls can cause injuries. They can happen to people of all ages. There are many things you can do to make your home safe and to help prevent falls. What can I do on the outside of my home?  Regularly fix the edges of walkways and driveways and fix any cracks.  Remove anything that might make you trip as you walk through a door, such as a raised step or threshold.  Trim any bushes or trees on the path to your home.  Use bright outdoor lighting.  Clear any walking paths of anything that might make someone trip, such as rocks or tools.  Regularly check to see if handrails are loose or broken. Make sure that both sides of any steps have handrails.  Any raised decks and porches should have guardrails on the edges.  Have any leaves, snow, or ice cleared regularly.  Use sand or salt on walking paths during winter.  Clean up any spills in your garage right away. This includes oil or grease spills. What can I do in the bathroom?  Use night lights.  Install grab bars by the toilet and in the tub and shower. Do not use towel bars as grab bars.  Use non-skid mats or decals in the tub or shower.  If you need to sit down in the shower, use a plastic, non-slip stool.  Keep the floor dry. Clean up any water that spills on the floor as soon as it happens.  Remove soap buildup in the tub or shower  regularly.  Attach bath mats securely with double-sided non-slip rug tape.  Do not have throw rugs and other things on the floor that can make you trip. What can I do in the bedroom?  Use night lights.  Make sure that you have a light by your bed that is easy to reach.  Do not use any sheets or blankets that are too big for your bed. They should not hang down onto the floor.  Have a firm chair that has side arms. You can use this for support while you get dressed.  Do not have throw rugs and other things on the floor that can make you trip. What can I do in the kitchen?  Clean up any spills right away.  Avoid walking on wet floors.  Keep items that you use a lot in easy-to-reach places.  If you need to reach something above you, use a strong step stool that has a grab bar.  Keep electrical cords out of the way.  Do not use floor polish or wax that makes floors slippery. If you must use wax, use non-skid floor wax.  Do not have throw rugs and other things on the  floor that can make you trip. What can I do with my stairs?  Do not leave any items on the stairs.  Make sure that there are handrails on both sides of the stairs and use them. Fix handrails that are broken or loose. Make sure that handrails are as long as the stairways.  Check any carpeting to make sure that it is firmly attached to the stairs. Fix any carpet that is loose or worn.  Avoid having throw rugs at the top or bottom of the stairs. If you do have throw rugs, attach them to the floor with carpet tape.  Make sure that you have a light switch at the top of the stairs and the bottom of the stairs. If you do not have them, ask someone to add them for you. What else can I do to help prevent falls?  Wear shoes that:  Do not have high heels.  Have rubber bottoms.  Are comfortable and fit you well.  Are closed at the toe. Do not wear sandals.  If you use a stepladder:  Make sure that it is fully  opened. Do not climb a closed stepladder.  Make sure that both sides of the stepladder are locked into place.  Ask someone to hold it for you, if possible.  Clearly mark and make sure that you can see:  Any grab bars or handrails.  First and last steps.  Where the edge of each step is.  Use tools that help you move around (mobility aids) if they are needed. These include:  Canes.  Walkers.  Scooters.  Crutches.  Turn on the lights when you go into a dark area. Replace any light bulbs as soon as they burn out.  Set up your furniture so you have a clear path. Avoid moving your furniture around.  If any of your floors are uneven, fix them.  If there are any pets around you, be aware of where they are.  Review your medicines with your doctor. Some medicines can make you feel dizzy. This can increase your chance of falling. Ask your doctor what other things that you can do to help prevent falls. This information is not intended to replace advice given to you by your health care provider. Make sure you discuss any questions you have with your health care provider. Document Released: 11/08/2008 Document Revised: 06/20/2015 Document Reviewed: 02/16/2014 Elsevier Interactive Patient Education  2017 [employer].

## 2019-02-09 NOTE — Progress Notes (Signed)
Subjective:     Patient ID: Aaron Snyder , male    DOB: [birth year] , 69 y.o.   MRN: [id number]   Chief Complaint  Patient presents with  . Annual Exam   Men's preventive visit. Patient Health Questionnaire (PHQ-2) is    Clinical Support from 02/09/2019 in [facility]  PHQ-2 Total Score  1     Patient is on a regular diet. Marital status: Widowed. Relevant history for alcohol use is:  Social History   Substance and Sexual Activity  Alcohol Use No   Relevant history for tobacco use is:  Social History   Tobacco Use  Smoking Status Former Smoker  . Packs/day: 1.00  . Years: 15.00  . Pack years: 15.00  . Types: Cigarettes  . Quit date: 03/15/1996  . Years since quitting: 22.9  Smokeless Tobacco Never Used  . HPI Men's preventive visit. Patient Health Questionnaire (PHQ-2) is    Clinical Support from 02/09/2019 in [facility]  PHQ-2 Total Score  1     Patient is on a regular diet, less red meat and eating more vegetables. Walks 3 miles 5-7 days a week.  Marital status: Widowed. Relevant history for alcohol use is:  Social History   Substance and Sexual Activity  Alcohol Use No   Relevant history for tobacco use is:  Social History   Tobacco Use  Smoking Status Former Smoker  . Packs/day: 1.00  . Years: 15.00  . Pack years: 15.00  . Types: Cigarettes  . Quit date: 03/15/1996  . Years since quitting: 22.9  Smokeless Tobacco Never Used   Here for HM   He has seen cardiology early December - no changes.  Aortic Aneurysm they will continue to monitor     Hypertension This is a chronic problem. The current episode started more than 1 year ago. The problem is unchanged. The problem is controlled. Pertinent negatives include no anxiety, chest pain, headaches or palpitations. There are no associated agents to hypertension. Risk factors for coronary artery disease include dyslipidemia. Past treatments include ACE  inhibitors and diuretics. There are no compliance problems.  There is no history of kidney disease.     Past Medical History:  Diagnosis Date  . Coronary artery disease   . Dysphagia   . Head injury   . Hypertension   . Pacemaker generator end of life 12/18/2011   Medtronic  . Presence of permanent cardiac pacemaker 02/01/2003   [employer]tr[employer]  . Syncope      Family History  Problem Relation Age of Onset  . Diabetes Father   . Hypertension Father      Current Outpatient Medications:  .  amLODipine (NORVASC) 10 MG tablet, TAKE 1 TABLET BY MOUTH EVERY DAY, Disp: 90 tablet, Rfl: 1 .  aspirin EC 81 MG tablet, Take 81 mg by mouth daily., Disp: , Rfl:  .  Candesartan Cilexetil-HCTZ 32-25 MG TABS, Take 1 tablet by mouth daily., Disp: 90 tablet, Rfl: 1 .  cholecalciferol (VITAMIN D3) 25 MCG (1000 UNIT) tablet, Take 1,000 Units by mouth daily., Disp: , Rfl:  .  glucosamine-chondroitin 500-400 MG tablet, Take 1 tablet by mouth daily., Disp: , Rfl:  .  loratadine (CLARITIN) 10 MG tablet, Take 10 mg by mouth daily., Disp: , Rfl:  .  Multiple Vitamins-Minerals (MULTIVITAMIN WITH MINERALS) tablet, Take 1 tablet by mouth daily., Disp: , Rfl:  .  prednisoLONE acetate (PRED FORTE) 1 % ophthalmic suspension, Place 1 drop into the  left eye 4 (four) times daily., Disp: 5 mL, Rfl: 1 .  Tdap (BOOSTRIX) 5-2.5-18.5 LF-MCG/0.5 injection, Inject 0.5 mLs into the muscle once for 1 dose., Disp: 0.5 mL, Rfl: 0 .  vitamin C (ASCORBIC ACID) 250 MG tablet, Take 250 mg by mouth daily., Disp: , Rfl:  .  Zinc 50 MG CAPS, Take 1 capsule by mouth daily., Disp: , Rfl:    Allergies  Allergen Reactions  . Apple Anaphylaxis  . Carrot [Daucus Carota] Anaphylaxis  . Iodinated Diagnostic Agents Shortness Of Breath and Swelling    PER PATIENT BREAK THROUGH REACTION WITH PRE MEDS-2016-----Can have contrast as long as he has a 13 prep  . Iohexol Shortness Of Breath and Swelling    Pt requires 13hr premeds.  . Oat Anaphylaxis   . Soy Allergy Anaphylaxis  . Wheat Anaphylaxis  . Almond Meal Itching  . Cow's Milk [Lac Bovis]     Hives, and upset stomach      Review of Systems  Constitutional: Negative for fatigue.  HENT: Negative.   Eyes: Negative.   Respiratory: Negative.   Cardiovascular: Negative.  Negative for chest pain, palpitations and leg swelling.  Gastrointestinal: Negative.   Endocrine: Negative for polydipsia, polyphagia and polyuria.  Genitourinary: Negative.   Musculoskeletal: Negative.   Skin: Negative.   Allergic/Immunologic: Negative.   Neurological: Negative for dizziness, syncope and headaches.  Hematological: Negative.   Psychiatric/Behavioral: Negative.      Today's Vitals   02/09/19 0918  BP: 138/78  Pulse: 100  Temp: (!) 97.5 F (36.4 C)  TempSrc: Oral  Weight: 223 lb (101.2 kg)  Height: 5' 10.4" (1.788 m)  PainSc: 0-No pain   Body mass index is 31.63 kg/m.   Objective:  Physical Exam Vitals reviewed.  Constitutional:      General: He is not in acute distress.    Appearance: Normal appearance. He is not ill-appearing.  HENT:     Head: Normocephalic and atraumatic.     Right Ear: External ear normal.     Left Ear: External ear normal.     Nose: Nose normal. No congestion.     Mouth/Throat:     Mouth: Mucous membranes are moist.  Eyes:     Extraocular Movements: Extraocular movements intact.     Conjunctiva/sclera: Conjunctivae normal.     Pupils: Pupils are equal, round, and reactive to light.  Cardiovascular:     Rate and Rhythm: Normal rate and regular rhythm.     Pulses: Normal pulses.     Heart sounds: Normal heart sounds.  Pulmonary:     Effort: Pulmonary effort is normal.     Breath sounds: Normal breath sounds.  Abdominal:     General: Abdomen is flat.     Palpations: Abdomen is soft.  Musculoskeletal:        General: Normal range of motion.     Cervical back: Normal range of motion and neck supple. No muscular tenderness.  Skin:    General:  Skin is warm and dry.     Capillary Refill: Capillary refill takes less than 2 seconds.  Neurological:     General: No focal deficit present.     Mental Status: He is alert.  Psychiatric:        Mood and Affect: Mood normal.        Behavior: Behavior normal.        Thought Content: Thought content normal.        Judgment: Judgment normal.  Assessment And Plan:     1. Health maintenance examination . Behavior modifications discussed and diet history reviewed.   . Pt will continue to exercise regularly and modify diet with low GI, plant based foods and decrease intake of processed foods.  . Recommend intake of daily multivitamin, Vitamin D, and calcium.  . Recommend preventive screenings, as well as recommend immunizations that include influenza, TDAP (sent to pharmacy) . He also had a Medicare AWV today with [facility] - Lipid Profile  2. Encounter for prostate cancer screening   3. Other long term (current) drug therapy  - CBC no Diff  4. Essential hypertension  Chronic, fair control  Continue with current medications and follow up with Cardiology - amLODipine (NORVASC) 10 MG tablet; Take 1 tablet (10 mg total) by mouth daily.  Dispense: 90 tablet; Refill: 1 - Candesartan Cilexetil-HCTZ 32-25 MG TABS; Take 1 tablet by mouth daily.  Dispense: 90 tablet; Refill: 1 - CMP14+EGFR  5. Abnormal glucose  Chronic, controlled  No current medications  Encouraged to limit intake of sugary foods and drinks  Encouraged to increase physical activity to 150 minutes per week as tolerated - Hemoglobin A1c  6. Chronic bilateral low back pain without sciatica  Stable - PSA  7. Dyspnea on exertion  Chronic, he has had a full work up with cardiology  I will start him on symbicort and refer to Pulmonology for further evaluation - Ambulatory referral to Pulmonology - albuterol (VENTOLIN HFA) 108 (90 Base) MCG/ACT inhaler; Inhale 2 puffs into the lungs every 6 (six) hours as  needed for wheezing or shortness of breath.  Dispense: 18 g; Refill: 1 - budesonide-formoterol (SYMBICORT) 80-4.5 MCG/ACT inhaler; Inhale 2 puffs into the lungs daily.  Dispense: 3 Inhaler; Refill: 3  8. Thoracic aortic aneurysm without rupture (HCC)  Stable, continue with follow up with cardiology  9. SSS (sick sinus syndrome) (HCC)  Chronic, continue follow up with cardiology  10. S/P cardiac pacemaker procedure, 12/18/11, new generator  Continue follow up with cardiology           [name], FNP

## 2019-02-09 NOTE — Progress Notes (Signed)
This visit occurred during the SARS-CoV-2 public health emergency.  Safety protocols were in place, including screening questions prior to the visit, additional usage of staff PPE, and extensive cleaning of exam room while observing appropriate contact time as indicated for disinfecting solutions.  Subjective:   Aaron Snyder is a 69 y.o. male who presents for Medicare Annual/Subsequent preventive examination.  Review of Systems:  n/a Cardiac Risk Factors include: advanced age (>21men, >6 women);hypertension;male gender;obesity (BMI >30kg/m2)     Objective:    Vitals: BP 138/78 (BP Location: Left Arm, Patient Position: Sitting, Cuff Size: Normal)   Pulse 100   Temp (!) 97.5 F (36.4 C) (Oral)   Ht 5' 10.4" (1.788 m)   Wt 223 lb 3.2 oz (101.2 kg)   SpO2 95%   BMI 31.66 kg/m   Body mass index is 31.66 kg/m.  Advanced Directives 02/09/2019 02/03/2018 12/26/2014 10/14/2013 12/15/2011  Does Patient Have a Medical Advance Directive? No No No No Patient does not have advance directive;Patient would not like information  Would patient like information on creating a medical advance directive? Yes (MAU/Ambulatory/Procedural Areas - Information given) Yes (MAU/Ambulatory/Procedural Areas - Information given) Yes - Educational materials given No - patient declined information -    Tobacco Social History   Tobacco Use  Smoking Status Former Smoker  . Packs/day: 1.00  . Years: 15.00  . Pack years: 15.00  . Types: Cigarettes  . Quit date: 03/15/1996  . Years since quitting: 22.9  Smokeless Tobacco Never Used     Counseling given: Not Answered   Clinical Intake:  Pre-visit preparation completed: Yes  Pain : 0-10 Pain Score: 7  Pain Type: Chronic pain Pain Location: Back Pain Orientation: Lower Pain Descriptors / Indicators: Burning Pain Onset: More than a month ago Pain Frequency: Constant Pain Relieving Factors: stretching helps for a little  Pain Relieving Factors:  stretching helps for a little  Nutritional Status: BMI > 30  Obese Nutritional Risks: None Diabetes: No  How often do you need to have someone help you when you read instructions, pamphlets, or other written materials from your doctor or pharmacy?: 1 - Never What is the last grade level you completed in school?: 2 years college  Interpreter Needed?: No  Information entered by :: NAllen LPN  Past Medical History:  Diagnosis Date  . Coronary artery disease   . Dysphagia   . Head injury   . Hypertension   . Pacemaker generator end of life 12/18/2011   [employer]tronic  . Presence of permanent cardiac pacemaker 02/01/2003   [employer]tr[employer]  . Syncope    Past Surgical History:  Procedure Laterality Date  . CARDIAC CATHETERIZATION N/A 12/26/2014   Procedure: Left Heart Cath and Coronary Angiography;  Surgeon: [name], MD;  Location: [facility];  Service: Cardiovascular;  Laterality: N/A;  . ESOPHAGOGASTRODUODENOSCOPY     [place]  . LEFT AND RIGHT HEART CATHETERIZATION WITH CORONARY ANGIOGRAM N/A 12/17/2011   Procedure: LEFT AND RIGHT HEART CATHETERIZATION WITH CORONARY ANGIOGRAM;  Surgeon: [name], MD;  Location: [facility] CATH LAB;  Service: Cardiovascular;  Laterality: N/A;  . LOOP RECORDER IMPLANT/EXPLANT  2005  . NM MYOCAR PERF WALL MOTION  01/2011   Negative  . PACEMAKER GENERATOR CHANGE  12/18/2011   [employer]tronic  . PACEMAKER INSERTION  2005   [employer]tr[employer]  . PERMANENT PACEMAKER GENERATOR CHANGE N/A 12/18/2011   Procedure: PERMANENT PACEMAKER GENERATOR CHANGE;  Surgeon: [name], MD;  Location: [facility] CATH LAB;  Service:  Cardiovascular;  Laterality: N/A;  . TOTAL SHOULDER ARTHROPLASTY     Family History  Problem Relation Age of Onset  . Diabetes Father   . Hypertension Father    Social History   Socioeconomic History  . Marital status: Widowed    Spouse name: Not on file  . Number of children: Not on file  . Years of education: Not on file  . Highest education level:  Not on file  Occupational History  . Occupation: Disabled    Employer: UNEMPLOYED  Tobacco Use  . Smoking status: Former Smoker    Packs/day: 1.00    Years: 15.00    Pack years: 15.00    Types: Cigarettes    Quit date: 03/15/1996    Years since quitting: 22.9  . Smokeless tobacco: Never Used  Substance and Sexual Activity  . Alcohol use: No  . Drug use: Yes    Types: Marijuana  . Sexual activity: Not Currently  Other Topics Concern  . Not on file  Social History Narrative  . Not on file   Social Determinants of Health   Financial Resource Strain: Low Risk   . Difficulty of Paying Living Expenses: Not hard at all  Food Insecurity: No Food Insecurity  . Worried About Charity fundraiser in the Last Year: Never true  . Ran Out of Food in the Last Year: Never true  Transportation Needs: No Transportation Needs  . Lack of Transportation (Medical): No  . Lack of Transportation (Non-Medical): No  Physical Activity: Sufficiently Active  . Days of Exercise per Week: 7 days  . Minutes of Exercise per Session: 90 min  Stress: No Stress Concern Present  . Feeling of Stress : Only a little  Social Connections:   . Frequency of Communication with Friends and Family: Not on file  . Frequency of Social Gatherings with Friends and Family: Not on file  . Attends Religious Services: Not on file  . Active Member of Clubs or Organizations: Not on file  . Attends Archivist Meetings: Not on file  . Marital Status: Not on file    Outpatient Encounter Medications as of 02/09/2019  Medication Sig  . aspirin EC 81 MG tablet Take 81 mg by mouth daily.  . cholecalciferol (VITAMIN D3) 25 MCG (1000 UNIT) tablet Take 1,000 Units by mouth daily.  [name] glucosamine-chondroitin 500-400 MG tablet Take 1 tablet by mouth daily.  [name] loratadine (CLARITIN) 10 MG tablet Take 10 mg by mouth daily.  . Multiple Vitamins-Minerals (MULTIVITAMIN WITH MINERALS) tablet Take 1 tablet by mouth daily.  .  prednisoLONE acetate (PRED FORTE) 1 % ophthalmic suspension Place 1 drop into the left eye 4 (four) times daily.  . vitamin C (ASCORBIC ACID) 250 MG tablet Take 250 mg by mouth daily.  . Zinc 50 MG CAPS Take 1 capsule by mouth daily.  . [DISCONTINUED] amLODipine (NORVASC) 10 MG tablet TAKE 1 TABLET BY MOUTH EVERY DAY  . [DISCONTINUED] Candesartan Cilexetil-HCTZ 32-25 MG TABS Take 1 tablet by mouth daily.  . Tdap (BOOSTRIX) 5-2.5-18.5 LF-MCG/0.5 injection Inject 0.5 mLs into the muscle once for 1 dose.   No facility-administered encounter medications on file as of 02/09/2019.    Activities of Daily Living In your present state of health, do you have any difficulty performing the following activities: 02/09/2019  Hearing? N  Comment not as sharp  Vision? N  Difficulty concentrating or making decisions? Y  Comment due to head injury  Walking or climbing stairs?  Y  Dressing or bathing? N  Doing errands, shopping? N  Preparing Food and eating ? N  Using the Toilet? N  In the past six months, have you accidently leaked urine? Y  Comment very rarely  Do you have problems with loss of bowel control? N  Managing your Medications? N  Managing your Finances? N  Housekeeping or managing your Housekeeping? N  Some recent data might be hidden    Patient Care Team: [name], FNP as PCP - General ([place]) Warden Fillers, MD as Consulting Physician (Ophthalmology)   Assessment:   This is a routine wellness examination for [facility].  Exercise Activities and Dietary recommendations Current Exercise Habits: Home exercise routine, Type of exercise: walking;strength training/weights, Time (Minutes): > 60, Frequency (Times/Week): 7, Weekly Exercise (Minutes/Week): 0  Goals    . Patient Stated     Wants to be pain free     . Patient Stated     02/09/2019, not catch covid and lose 5 pounds    . Weight (lb) < 200 lb (90.7 kg) (pt-stated)     Wants to lower BMI       Fall  Risk Fall Risk  02/09/2019 08/04/2018 02/03/2018 02/03/2018  Falls in the past year? 0 0 0 0  Risk for fall due to : Medication side effect;Impaired balance/gait - Medication side effect -  Follow up Falls evaluation completed;Education provided;Falls prevention discussed - Falls prevention discussed -   Is the patient's home free of loose throw rugs in walkways, pet beds, electrical cords, etc?   yes      Grab bars in the bathroom? yes      Handrails on the stairs?   yes      Adequate lighting?   yes  Timed Get Up and Go Performed: n/a  Depression Screen PHQ 2/9 Scores 02/09/2019 08/04/2018 02/03/2018 02/03/2018  PHQ - 2 Score 1 0 4 0  PHQ- 9 Score 7 - 13 -    Cognitive Function     6CIT Screen 02/09/2019 02/03/2018  What Year? 0 points 0 points  What month? 0 points 0 points  What time? 0 points 3 points  Count back from 20 0 points 0 points  Months in reverse 2 points 0 points  Repeat phrase 0 points 0 points  Total Score 2 3    Immunization History  Administered Date(s) Administered  . Influenza,inj,Quad PF,6+ Mos 10/16/2013  . Influenza-Unspecified 10/13/2017, 11/08/2018  . Pneumococcal Conjugate-13 03/02/2018    Qualifies for Shingles Vaccine? yes  Screening Tests Health Maintenance  Topic Date Due  . TETANUS/TDAP  06/23/1968  . COLONOSCOPY  02/09/2020 (Originally 06/24/1999)  . PNA vac Low Risk Adult (2 of 2 - PPSV23) 03/03/2019  . INFLUENZA VACCINE  Completed  . Hepatitis C Screening  Completed   Cancer Screenings: Lung: Low Dose CT Chest recommended if Age 23-80 years, 30 pack-year currently smoking OR have quit w/in 15years. Patient does not qualify. Colorectal: had cologuard  Additional Screenings:  Hepatitis C Screening:02/03/2018      Plan:    Patient wants to lose 5 pounds and avoid catching covid.  I have personally reviewed and noted the following in the patient's chart:   . Medical and social history . Use of alcohol, tobacco or illicit drugs  . Current  medications and supplements . Functional ability and status . Nutritional status . Physical activity . Advanced directives . List of other physicians . Hospitalizations, surgeries, and ER visits in previous 68  months . Vitals . Screenings to include cognitive, depression, and falls . Referrals and appointments  In addition, I have reviewed and discussed with patient certain preventive protocols, quality metrics, and best practice recommendations. A written personalized care plan for preventive services as well as general preventive health recommendations were provided to patient.     Kellie Simmering, LPN  [phone]

## 2019-02-09 NOTE — Patient Instructions (Signed)
Raynaud Phenomenon ° °Raynaud phenomenon is a condition that affects the blood vessels (arteries) that carry blood to your fingers and toes. The arteries that supply blood to your ears, lips, nipples, or the tip of your nose might also be affected. Raynaud phenomenon causes the arteries to become narrow temporarily (spasm). As a result, the flow of blood to the affected areas is temporarily decreased. This usually occurs in response to cold temperatures or stress. During an attack, the skin in the affected areas turns white, then blue, and finally red. You may also feel tingling or numbness in those areas. °Attacks usually last for only a brief period, and then the blood flow to the area returns to normal. In most cases, Raynaud phenomenon does not cause serious health problems. °What are the causes? °In many cases, the cause of this condition is not known. The condition may occur on its own (primary Raynaud phenomenon) or may be associated with other diseases or factors (secondary Raynaud phenomenon). °Possible causes may include: °· Diseases or medical conditions that damage the arteries. °· Injuries and repetitive actions that hurt the hands or feet. °· Being exposed to certain chemicals. °· Taking medicines that narrow the arteries. °· Other medical conditions, such as lupus, scleroderma, rheumatoid arthritis, thyroid problems, blood disorders, Sjogren syndrome, or atherosclerosis. °What increases the risk? °The following factors may make you more likely to develop this condition: °· Being 20-40 years old. °· Being male. °· Having a family history of Raynaud phenomenon. °· Living in a cold climate. °· Smoking. °What are the signs or symptoms? °Symptoms of this condition usually occur when you are exposed to cold temperatures or when you have emotional stress. The symptoms may last for a few minutes or up to several hours. They usually affect your fingers but may also affect your toes, nipples, lips, ears, or  the tip of your nose. Symptoms may include: °· Changes in skin color. The skin in the affected areas will turn pale or white. The skin may then change from white to bluish to red as normal blood flow returns to the area. °· Numbness, tingling, or pain in the affected areas. °In severe cases, symptoms may include: °· Skin sores. °· Tissues decaying and dying (gangrene). °How is this diagnosed? °This condition may be diagnosed based on: °· Your symptoms and medical history. °· A physical exam. During the exam, you may be asked to put your hands in cold water to check for a reaction to cold temperature. °· Tests, such as: °? Blood tests to check for other diseases or conditions. °? A test to check the movement of blood through your arteries and veins (vascular ultrasound). °? A test in which the skin at the base of your fingernail is examined under a microscope (nailfold capillaroscopy). °How is this treated? °Treatment for this condition often involves making lifestyle changes and taking steps to control your exposure to cold temperatures. For more severe cases, medicine (calcium channel blockers) may be used to improve blood flow. Surgery is sometimes done to block the nerves that control the affected arteries, but this is rare. °Follow these instructions at home: °Avoiding cold temperatures °Take these steps to avoid exposure to cold: °· If possible, stay indoors during cold weather. °· When you go outside during cold weather, dress in layers and wear mittens, a hat, a scarf, and warm footwear. °· Wear mittens or gloves when handling ice or frozen food. °· Use holders for glasses or cans containing cold drinks. °·   Let warm water run for a while before taking a shower or bath. °· Warm up the car before driving in cold weather. °Lifestyle ° °· If possible, avoid stressful and emotional situations. Try to find ways to manage your stress, such as: °? Exercise. °? Yoga. °? Meditation. °? Biofeedback. °· Do not use any  products that contain nicotine or tobacco, such as cigarettes and e-cigarettes. If you need help quitting, ask your health care provider. °· Avoid secondhand smoke. °· Limit your use of caffeine. °? Switch to decaffeinated coffee, tea, and soda. °? Avoid chocolate. °· Avoid vibrating tools and machinery. °General instructions °· Protect your hands and feet from injuries, cuts, or bruises. °· Avoid wearing tight rings or wristbands. °· Wear loose fitting socks and comfortable, roomy shoes. °· Take over-the-counter and prescription medicines only as told by your health care provider. °Contact a health care provider if: °· Your discomfort becomes worse despite lifestyle changes. °· You develop sores on your fingers or toes that do not heal. °· Your fingers or toes turn black. °· You have breaks in the skin on your fingers or toes. °· You have a fever. °· You have pain or swelling in your joints. °· You have a rash. °· Your symptoms occur on only one side of your body. °Summary °· Raynaud phenomenon is a condition that affects the arteries that carry blood to your fingers, toes, ears, lips, nipples, or the tip of your nose. °· In many cases, the cause of this condition is not known. °· Symptoms of this condition include changes in skin color, and numbness and tingling of the affected area. °· Treatment for this condition includes lifestyle changes, reducing exposure to cold temperatures, and using medicines for severe cases of the condition. °· Contact your health care provider if your condition worsens despite treatment. °This information is not intended to replace advice given to you by your health care provider. Make sure you discuss any questions you have with your health care provider. °Document Revised: 01/15/2017 Document Reviewed: 02/24/2016 °Elsevier Patient Education © 2020 [employer] ° °

## 2019-02-10 LAB — CMP14+EGFR
ALT: 20 IU/L (ref 0–44)
AST: 32 IU/L (ref 0–40)
Albumin/Globulin Ratio: 1.8 (ref 1.2–2.2)
Albumin: 4.5 g/dL (ref 3.8–4.8)
Alkaline Phosphatase: 108 IU/L (ref 39–117)
BUN/Creatinine Ratio: 16 (ref 10–24)
BUN: 23 mg/dL (ref 8–27)
Bilirubin Total: 0.4 mg/dL (ref 0.0–1.2)
CO2: 23 mmol/L (ref 20–29)
Calcium: 9.6 mg/dL (ref 8.6–10.2)
Chloride: 99 mmol/L (ref 96–106)
Creatinine, Ser: 1.48 mg/dL — ABNORMAL HIGH (ref 0.76–1.27)
GFR calc Af Amer: 55 mL/min/{1.73_m2} — ABNORMAL LOW (ref >59)
GFR calc non Af Amer: 48 mL/min/{1.73_m2} — ABNORMAL LOW (ref >59)
Globulin, Total: 2.5 g/dL (ref 1.5–4.5)
Glucose: 100 mg/dL — ABNORMAL HIGH (ref 65–99)
Potassium: 4 mmol/L (ref 3.5–5.2)
Sodium: 139 mmol/L (ref 134–144)
Total Protein: 7 g/dL (ref 6.0–8.5)

## 2019-02-10 LAB — LIPID PANEL
Chol/HDL Ratio: 3 ratio (ref 0.0–5.0)
Cholesterol, Total: 159 mg/dL (ref 100–199)
HDL: 53 mg/dL (ref >39)
LDL Chol Calc (NIH): 94 mg/dL (ref 0–99)
Triglycerides: 59 mg/dL (ref 0–149)
VLDL Cholesterol Cal: 12 mg/dL (ref 5–40)

## 2019-02-10 LAB — CBC
Hematocrit: 45.7 % (ref 37.5–51.0)
Hemoglobin: 15.7 g/dL (ref 13.0–17.7)
MCH: 29.3 pg (ref 26.6–33.0)
MCHC: 34.4 g/dL (ref 31.5–35.7)
MCV: 85 fL (ref 79–97)
Platelets: 205 10*3/uL (ref 150–450)
RBC: 5.36 x10E6/uL (ref 4.14–5.80)
RDW: 14.1 % (ref 11.6–15.4)
WBC: 5.5 10*3/uL (ref 3.4–10.8)

## 2019-02-10 LAB — HEMOGLOBIN A1C
Est. average glucose Bld gHb Est-mCnc: 114 mg/dL
Hgb A1c MFr Bld: 5.6 % (ref 4.8–5.6)

## 2019-02-10 LAB — PSA: Prostate Specific Ag, Serum: 3 ng/mL (ref 0.0–4.0)

## 2019-03-13 ENCOUNTER — Ambulatory Visit (INDEPENDENT_AMBULATORY_CARE_PROVIDER_SITE_OTHER): Payer: Medicare HMO | Admitting: Nurse Practitioner

## 2019-03-13 ENCOUNTER — Encounter: Payer: Self-pay | Admitting: Nurse Practitioner

## 2019-03-13 ENCOUNTER — Other Ambulatory Visit: Payer: Self-pay

## 2019-03-13 VITALS — BP 118/80 | HR 97 | Temp 98.7°F | Ht 68.8 in | Wt 226.0 lb

## 2019-03-13 DIAGNOSIS — R0609 Other forms of dyspnea: Secondary | ICD-10-CM

## 2019-03-13 DIAGNOSIS — R0600 Dyspnea, unspecified: Secondary | ICD-10-CM

## 2019-03-13 MED ORDER — PREDNISONE 10 MG (21) PO TBPK: ORAL_TABLET | ORAL | 0 refills | Status: DC

## 2019-03-13 NOTE — Patient Instructions (Signed)
   Increase the symbicort to 2 puffs twice a day  Will call pulmonology to see about your referral

## 2019-03-13 NOTE — Progress Notes (Signed)
Subjective:     Patient ID: Aaron Snyder , male    DOB: 12/08/1950 , 69 y.o.   MRN: [id number]   Chief Complaint  Patient presents with  . Shortness of Breath    patient presents today for a med check he is currently taking the symbicort and ventolin. he stated he is unsure if it is cause of the mask    HPI  He is planning to have the covid vaccine had to be on hold due to the weather. He has not heard about his referral to pulmology he would like to wait until after receiving his covid vaccine.   He is using the albuterol inhaler 3 times a day in addition to the Symbicort once a day.    Shortness of Breath This is a recurrent problem. The current episode started more than 1 year ago. The problem occurs intermittently. Pertinent negatives include no ear pain, fever or leg swelling. He has tried steroid inhalers for the symptoms. The treatment provided no relief. There is no history of allergies.     Past Medical History:  Diagnosis Date  . Coronary artery disease   . Dysphagia   . Head injury   . Hypertension   . Pacemaker generator end of life 12/18/2011   [employer]  . Presence of permanent cardiac pacemaker 02/01/2003   [employer]  . Syncope      Family History  Problem Relation Age of Onset  . Diabetes Father   . Hypertension Father      Current Outpatient Medications:  .  albuterol (VENTOLIN HFA) 108 (90 Base) MCG/ACT inhaler, Inhale 2 puffs into the lungs every 6 (six) hours as needed for wheezing or shortness of breath., Disp: 18 g, Rfl: 1 .  amLODipine (NORVASC) 10 MG tablet, Take 1 tablet (10 mg total) by mouth daily., Disp: 90 tablet, Rfl: 1 .  aspirin EC 81 MG tablet, Take 81 mg by mouth daily., Disp: , Rfl:  .  budesonide-formoterol (SYMBICORT) 80-4.5 MCG/ACT inhaler, Inhale 2 puffs into the lungs daily., Disp: 3 Inhaler, Rfl: 3 .  Candesartan Cilexetil-HCTZ 32-25 MG TABS, Take 1 tablet by mouth daily., Disp: 90 tablet, Rfl: 1 .  cholecalciferol (VITAMIN D3)  25 MCG (1000 UNIT) tablet, Take 1,000 Units by mouth daily., Disp: , Rfl:  .  loratadine (CLARITIN) 10 MG tablet, Take 10 mg by mouth daily., Disp: , Rfl:  .  Multiple Vitamins-Minerals (MULTIVITAMIN WITH MINERALS) tablet, Take 1 tablet by mouth daily., Disp: , Rfl:  .  prednisoLONE acetate (PRED FORTE) 1 % ophthalmic suspension, Place 1 drop into the left eye 4 (four) times daily., Disp: 5 mL, Rfl: 1 .  vitamin C (ASCORBIC ACID) 250 MG tablet, Take 250 mg by mouth daily., Disp: , Rfl:  .  Zinc 50 MG CAPS, Take 1 capsule by mouth daily., Disp: , Rfl:  .  glucosamine-chondroitin 500-400 MG tablet, Take 1 tablet by mouth daily., Disp: , Rfl:    Allergies  Allergen Reactions  . Apple Anaphylaxis  . Carrot [Daucus Carota] Anaphylaxis  . Iodinated Diagnostic Agents Shortness Of Breath and Swelling    PER PATIENT BREAK THROUGH REACTION WITH PRE MEDS-2016-----Can have contrast as long as he has a 13 prep  . Iohexol Shortness Of Breath and Swelling    Pt requires 13hr premeds.  . Oat Anaphylaxis  . Soy Allergy Anaphylaxis  . Wheat Anaphylaxis  . Almond Meal Itching  . Cow's Milk [Lac Bovis]     Hives, and  upset stomach      Review of Systems  Constitutional: Positive for fatigue. Negative for fever.  HENT: Negative.  Negative for ear pain.   Eyes: Negative.   Respiratory: Positive for shortness of breath.   Cardiovascular: Negative.  Negative for leg swelling.  Gastrointestinal: Negative.   Endocrine: Negative for polydipsia, polyphagia and polyuria.  Genitourinary: Negative.   Musculoskeletal: Negative.   Skin: Negative.   Allergic/Immunologic: Negative.   Neurological: Negative for dizziness and syncope.  Hematological: Negative.   Psychiatric/Behavioral: Negative.      Today's Vitals   03/13/19 0842  BP: 118/80  Pulse: 97  Temp: 98.7 F (37.1 C)  TempSrc: Oral  Weight: 226 lb (102.5 kg)  Height: 5' 8.8" (1.748 m)  PainSc: 7   PainLoc: Back   Body mass index is 33.57  kg/m.   Objective:  Physical Exam Vitals reviewed.  Constitutional:      General: He is not in acute distress.    Appearance: Normal appearance. He is not ill-appearing.  HENT:     Head: Normocephalic and atraumatic.     Right Ear: External ear normal.     Left Ear: External ear normal.     Nose: Nose normal. No congestion.     Mouth/Throat:     Mouth: Mucous membranes are moist.  Eyes:     Extraocular Movements: Extraocular movements intact.     Conjunctiva/sclera: Conjunctivae normal.     Pupils: Pupils are equal, round, and reactive to light.  Cardiovascular:     Rate and Rhythm: Normal rate and regular rhythm.     Pulses: Normal pulses.     Heart sounds: Normal heart sounds.  Pulmonary:     Effort: Pulmonary effort is normal.     Breath sounds: Normal breath sounds.  Abdominal:     General: Abdomen is flat.     Palpations: Abdomen is soft.  Musculoskeletal:        General: Normal range of motion.     Cervical back: Normal range of motion and neck supple. No muscular tenderness.  Skin:    General: Skin is warm and dry.     Capillary Refill: Capillary refill takes less than 2 seconds.  Neurological:     General: No focal deficit present.     Mental Status: He is alert.  Psychiatric:        Mood and Affect: Mood normal.        Behavior: Behavior normal.        Thought Content: Thought content normal.        Judgment: Judgment normal.         Assessment And Plan:     1. Dyspnea on exertion  He reports mild improvement however continues to use Albuterol at least 3 times a day, will increase his symbicort to two times a day   I have also encouraged him to schedule with pulmonology to get on the books and not wait until after he has his covid vaccine in the event there is a delay with the vaccine - predniSONE (STERAPRED UNI-PAK 21 TAB) 10 MG (21) TBPK tablet; Take as directed  Dispense: 21 tablet; Refill: 0          [name], FNP

## 2019-03-21 ENCOUNTER — Institutional Professional Consult (permissible substitution): Payer: Medicare HMO | Admitting: Pulmonary Disease

## 2019-04-04 ENCOUNTER — Ambulatory Visit (INDEPENDENT_AMBULATORY_CARE_PROVIDER_SITE_OTHER): Payer: Medicare HMO

## 2019-04-04 ENCOUNTER — Ambulatory Visit: Payer: Medicare HMO | Admitting: Pulmonary Disease

## 2019-04-04 ENCOUNTER — Encounter: Payer: Self-pay | Admitting: Pulmonary Disease

## 2019-04-04 ENCOUNTER — Other Ambulatory Visit: Payer: Self-pay

## 2019-04-04 DIAGNOSIS — R0600 Dyspnea, unspecified: Secondary | ICD-10-CM

## 2019-04-04 DIAGNOSIS — I712 Thoracic aortic aneurysm, without rupture, unspecified: Secondary | ICD-10-CM

## 2019-04-04 DIAGNOSIS — R0609 Other forms of dyspnea: Secondary | ICD-10-CM

## 2019-04-04 DIAGNOSIS — R0602 Shortness of breath: Secondary | ICD-10-CM | POA: Diagnosis not present

## 2019-04-04 HISTORY — DX: Other forms of dyspnea: R06.09

## 2019-04-04 NOTE — Patient Instructions (Signed)
Schedule PFTs. He will require Covid testing prior to this This will enable [place] to check for emphysema and asthma

## 2019-04-04 NOTE — Progress Notes (Signed)
Subjective:    Patient ID: Aaron Snyder, male    DOB: [birth year], 69 y.o.   MRN: [id number]  HPI  69 year old remote smoker presents for evaluation of dyspnea on exertion. He had sick sinus syndrome with syncope and underwent PPM placement in 2005 and has undergone a battery change around 2013.  He was admitted for shortness of breath in 2016 for a "pneumonia" and since then he reports worsening exercise tolerance.  He has a very active lifestyle and does cardio workouts for about 2 hours daily-including treadmill, elliptical and bike but now he has to rest after 30 minutes.  I have reviewed his extensive cardiology history and prior consultations, PPM was adjusted for chronotropic incompetence based on treadmill stress test in 2017 and he felt somewhat improved. He had a cold last about a year ago for about 2 weeks, denies recurrent chest colds. He has had an allergic diathesis and required allergy shots by his PCP, was on prednisone for almost a year for "burning and itching" of his skin and this seemed to help his breathing somewhat.  He was given a trial of Symbicort and albuterol by his PCP in 03/13/2019 and this did not help with the prednisone taper seem to help somewhat He denies frequent wheezing or childhood history of asthma.  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  He smoked starting at age 89 until he quit in 1998, about a pack a day, less than 30 pack years   Significant tests/ events reviewed  CT chest without contrast 07/2017 -sclerotic focus in left seventh rib, stable since 2015, borderline aneurysmal distal arch and proximal descending thoracic aorta 39 mm   Echo 11/2013 normal LV function 10/2015 treadmill stress test Cardiac cath 11/2014  N PSG 12/2011 -, MSLT negative  Past Medical History:  Diagnosis Date  . Coronary artery disease   . Dysphagia   .  Head injury   . Hypertension   . Pacemaker generator end of life 12/18/2011   [employer]  . Presence of permanent cardiac pacemaker 02/01/2003   [employer]  . Syncope    Past Surgical History:  Procedure Laterality Date  . CARDIAC CATHETERIZATION N/A 12/26/2014   Procedure: Left Heart Cath and Coronary Angiography;  Surgeon: [name], MD;  Location: [facility];  Service: Cardiovascular;  Laterality: N/A;  . ESOPHAGOGASTRODUODENOSCOPY     [place]  . LEFT AND RIGHT HEART CATHETERIZATION WITH CORONARY ANGIOGRAM N/A 12/17/2011   Procedure: LEFT AND RIGHT HEART CATHETERIZATION WITH CORONARY ANGIOGRAM;  Surgeon: [name], MD;  Location[facility];  Service: Cardiovascular;  Laterality: N/A;  . LOOP RECORDER IMPLANT/EXPLANT  2005  . NM MYOCAR PERF WALL MOTION  01/2011   Negative  . PACEMAKER GENERATOR CHANGE  12/18/2011   [employer]  . PACEMAKER INSERTION  2005   [employer]  . PERMANENT PACEMAKER GENERATOR CHANGE N/A 12/18/2011   Procedure: PERMANENT PACEMAKER GENERATOR CHANGE;  Surgeon: [name], MD;  Location: [facility] CATH LAB;  Service: Cardiovascular;  Laterality: N/A;  . TOTAL SHOULDER ARTHROPLASTY      Allergies  Allergen Reactions  . Apple Anaphylaxis  . Carrot [Daucus Carota] Anaphylaxis  . Iodinated Diagnostic Agents Shortness Of Breath and Swelling    PER PATIENT BREAK THROUGH REACTION WITH PRE MEDS-2016-----Can have contrast as long as he has a 13 prep  . Iohexol Shortness Of Breath and Swelling  Pt requires 13hr premeds.  . Oat Anaphylaxis  . Soy Allergy Anaphylaxis  . Wheat Anaphylaxis  . Almond Meal Itching  . Cow's Milk [Lac Bovis]     Hives, and upset stomach     Social History   Socioeconomic History  . Marital status: Widowed    Spouse name: Not on file  . Number of children: Not on file  . Years of education: Not on file  . Highest education level: Not on file  Occupational History  . Occupation: Disabled    Employer: UNEMPLOYED    Tobacco Use  . Smoking status: Former Smoker    Packs/day: 1.00    Years: 15.00    Pack years: 15.00    Types: Cigarettes    Quit date: 03/15/1996    Years since quitting: 23.0  . Smokeless tobacco: Never Used  Substance and Sexual Activity  . Alcohol use: No  . Drug use: Yes    Types: Marijuana  . Sexual activity: Not Currently  Other Topics Concern  . Not on file  Social History Narrative  . Not on file   Social Determinants of Health   Financial Resource Strain: Low Risk   . Difficulty of Paying Living Expenses: Not hard at all  Food Insecurity: No Food Insecurity  . Worried About Charity fundraiser in the Last Year: Never true  . Ran Out of Food in the Last Year: Never true  Transportation Needs: No Transportation Needs  . Lack of Transportation (Medical): No  . Lack of Transportation (Non-Medical): No  Physical Activity: Sufficiently Active  . Days of Exercise per Week: 7 days  . Minutes of Exercise per Session: 90 min  Stress: No Stress Concern Present  . Feeling of Stress : Only a little  Social Connections:   . Frequency of Communication with Friends and Family: Not on file  . Frequency of Social Gatherings with Friends and Family: Not on file  . Attends Religious Services: Not on file  . Active Member of Clubs or Organizations: Not on file  . Attends Archivist Meetings: Not on file  . Marital Status: Not on file  Intimate Partner Violence:   . Fear of Current or Ex-Partner: Not on file  . Emotionally Abused: Not on file  . Physically Abused: Not on file  . Sexually Abused: Not on file     Family History  Problem Relation Age of Onset  . Diabetes Father   . Hypertension Father        Review of Systems Constitutional: negative for anorexia, fevers and sweats  Eyes: negative for irritation, redness and visual disturbance  Ears, nose, mouth, throat, and face: negative for earaches, epistaxis, nasal congestion and sore throat   Respiratory: negative for cough, , sputum and wheezing  Cardiovascular: negative for chest pain,lower extremity edema, orthopnea, palpitations and syncope  Gastrointestinal: negative for abdominal pain, constipation, diarrhea, melena, nausea and vomiting  Genitourinary:negative for dysuria, frequency and hematuria  Hematologic/lymphatic: negative for bleeding, easy bruising and lymphadenopathy  Musculoskeletal:negative for arthralgias, muscle weakness and stiff joints  Neurological: negative for coordination problems, gait problems, headaches and weakness  Endocrine: negative for diabetic symptoms including polydipsia, polyuria and weight loss     Objective:   Physical Exam  Gen. Pleasant, well-nourished, in no distress, normal affect ENT - no pallor,icterus, no post nasal drip Neck: No JVD, no thyromegaly, no carotid bruits Lungs: no use of accessory muscles, no dullness to percussion, clear without rales or rhonchi  Cardiovascular: Rhythm regular, heart sounds  normal, no murmurs or gallops, no peripheral edema Abdomen: soft and non-tender, no hepatosplenomegaly, BS normal. Musculoskeletal: No deformities, no cyanosis or clubbing Neuro:  alert, non focal       Assessment & Plan:

## 2019-04-04 NOTE — Progress Notes (Signed)
Thanks, Davonna Belling. Hope something shows up on your w/u. I doubt the borderline aortic aneurysm is responsible for the dyspnea. Faviola Klare

## 2019-04-04 NOTE — Assessment & Plan Note (Signed)
If PFTs normal then may need second look at TAA with a TEE He has contrast allergy and CKD and MRI may not be possible due to PPM

## 2019-04-04 NOTE — Assessment & Plan Note (Addendum)
No clear etiology at present for his decreased exercise tolerance, his pacemaker has been adjusted for chronotropic incompetence. Emphysema is very mild on CT scan, quit smoking in 1998.  No evidence of fibrosis or pulmonary hypertension He does not clearly give a history of asthma but does seem to have an allergic diathesis requiring allergy shots in the past, he did not improve much with albuterol or Symbicort trial but did have some subjective improvement with prednisone. Lastly he does have aneurysm of distal aortic arch and proximal descending aorta-which may need a second look if we cannot find any other etiology  Schedule chest x-ray today and PFTs including postbronchodilator response -if he does have airway obstruction then will push different steroid/LABA combination

## 2019-04-20 ENCOUNTER — Ambulatory Visit (INDEPENDENT_AMBULATORY_CARE_PROVIDER_SITE_OTHER): Payer: Medicare HMO | Admitting: *Deleted

## 2019-04-20 DIAGNOSIS — I495 Sick sinus syndrome: Secondary | ICD-10-CM | POA: Diagnosis not present

## 2019-04-20 DIAGNOSIS — I498 Other specified cardiac arrhythmias: Secondary | ICD-10-CM

## 2019-04-21 LAB — CUP PACEART REMOTE DEVICE CHECK
Battery Impedance: 630 Ohm
Battery Remaining Longevity: 89 mo
Battery Voltage: 2.77 V
Brady Statistic AP VP Percent: 0 %
Brady Statistic AP VS Percent: 93 %
Brady Statistic AS VP Percent: 0 %
Brady Statistic AS VS Percent: 7 %
Date Time Interrogation Session: 20210326131432
Implantable Lead Implant Date: 20050106
Implantable Lead Implant Date: 20131122
Implantable Lead Location: 753859
Implantable Lead Location: 753860
Implantable Lead Model: 5092
Implantable Lead Model: 5594
Implantable Pulse Generator Implant Date: 20131122
Lead Channel Impedance Value: 449 Ohm
Lead Channel Impedance Value: 734 Ohm
Lead Channel Pacing Threshold Amplitude: 0.5 V
Lead Channel Pacing Threshold Amplitude: 0.625 V
Lead Channel Pacing Threshold Pulse Width: 0.4 ms
Lead Channel Pacing Threshold Pulse Width: 0.4 ms
Lead Channel Setting Pacing Amplitude: 1.5 V
Lead Channel Setting Pacing Amplitude: 2 V
Lead Channel Setting Pacing Pulse Width: 0.4 ms
Lead Channel Setting Sensing Sensitivity: 4 mV

## 2019-05-12 DIAGNOSIS — I129 Hypertensive chronic kidney disease with stage 1 through stage 4 chronic kidney disease, or unspecified chronic kidney disease: Secondary | ICD-10-CM | POA: Diagnosis not present

## 2019-05-12 DIAGNOSIS — R5383 Other fatigue: Secondary | ICD-10-CM | POA: Diagnosis not present

## 2019-05-12 DIAGNOSIS — Z6830 Body mass index (BMI) 30.0-30.9, adult: Secondary | ICD-10-CM | POA: Diagnosis not present

## 2019-05-12 DIAGNOSIS — N183 Chronic kidney disease, stage 3 unspecified: Secondary | ICD-10-CM | POA: Diagnosis not present

## 2019-06-01 DIAGNOSIS — R69 Illness, unspecified: Secondary | ICD-10-CM | POA: Diagnosis not present

## 2019-06-08 ENCOUNTER — Encounter: Payer: Self-pay | Admitting: Nurse Practitioner

## 2019-06-17 ENCOUNTER — Other Ambulatory Visit (HOSPITAL_COMMUNITY)
Admission: RE | Admit: 2019-06-17 | Discharge: 2019-06-17 | Disposition: A | Discharge status: Home / Self Care | Payer: Medicare HMO | Source: Ambulatory Visit | Attending: Pulmonary Disease | Admitting: Pulmonary Disease

## 2019-06-17 DIAGNOSIS — Z01812 Encounter for preprocedural laboratory examination: Secondary | ICD-10-CM | POA: Insufficient documentation

## 2019-06-17 DIAGNOSIS — Z20822 Contact with and (suspected) exposure to covid-19: Secondary | ICD-10-CM | POA: Insufficient documentation

## 2019-06-17 LAB — SARS CORONAVIRUS 2 (TAT 6-24 HRS): SARS Coronavirus 2: NEGATIVE

## 2019-06-21 ENCOUNTER — Other Ambulatory Visit: Payer: Self-pay

## 2019-06-21 ENCOUNTER — Ambulatory Visit (INDEPENDENT_AMBULATORY_CARE_PROVIDER_SITE_OTHER): Payer: Medicare HMO | Admitting: Pulmonary Disease

## 2019-06-21 DIAGNOSIS — R0609 Other forms of dyspnea: Secondary | ICD-10-CM

## 2019-06-21 DIAGNOSIS — R0600 Dyspnea, unspecified: Secondary | ICD-10-CM | POA: Diagnosis not present

## 2019-06-21 LAB — PULMONARY FUNCTION TEST
DL/VA % pred: 112 %
DL/VA: 4.54 ml/min/mmHg/L
DLCO cor % pred: 90 %
DLCO cor: 25.05 ml/min/mmHg
DLCO unc % pred: 90 %
DLCO unc: 25.05 ml/min/mmHg
FEF 25-75 Post: 2.58 L/sec
FEF 25-75 Pre: 2.82 L/sec
FEF2575-%Change-Post: -8 %
FEF2575-%Pred-Post: 94 %
FEF2575-%Pred-Pre: 103 %
FEV1-%Change-Post: -1 %
FEV1-%Pred-Post: 82 %
FEV1-%Pred-Pre: 83 %
FEV1-Post: 2.59 L
FEV1-Pre: 2.63 L
FEV1FVC-%Change-Post: 4 %
FEV1FVC-%Pred-Pre: 106 %
FEV6-%Change-Post: -4 %
FEV6-%Pred-Post: 76 %
FEV6-%Pred-Pre: 80 %
FEV6-Post: 3.05 L
FEV6-Pre: 3.2 L
FEV6FVC-%Change-Post: 0 %
FEV6FVC-%Pred-Post: 104 %
FEV6FVC-%Pred-Pre: 104 %
FVC-%Change-Post: -5 %
FVC-%Pred-Post: 73 %
FVC-%Pred-Pre: 77 %
FVC-Post: 3.05 L
FVC-Pre: 3.21 L
Post FEV1/FVC ratio: 85 %
Post FEV6/FVC ratio: 100 %
Pre FEV1/FVC ratio: 82 %
Pre FEV6/FVC Ratio: 100 %
RV % pred: 94 %
RV: 2.39 L
TLC % pred: 78 %
TLC: 5.82 L

## 2019-06-21 NOTE — Progress Notes (Signed)
Full PFT performed today. °

## 2019-07-10 ENCOUNTER — Telehealth: Payer: Self-pay | Admitting: Pulmonary Disease

## 2019-07-10 NOTE — Telephone Encounter (Signed)
Spoke with the pt and he is asking for his PFT results from 06/21/19 Please advise thanks!

## 2019-07-10 NOTE — Telephone Encounter (Signed)
ATC pt, no answer. Left message for pt to call back.  

## 2019-07-10 NOTE — Telephone Encounter (Signed)
PFTs did not show any evidence of airway obstruction Very mild restriction which may be weight related, do not think that inhalers will be helpful in this situation. I will let his cardiologist know

## 2019-07-10 NOTE — Telephone Encounter (Signed)
Thanks for follow up.

## 2019-07-11 NOTE — Telephone Encounter (Signed)
Patient called and results of recent PFT reviewed.  Per Dr. [name], PFTs did not show any evidence of airway obstruction. Very mild restriction which may be weight related, do not think that inhalers will be helpful in this situation. I will let his cardiologist know.  Information forwarded to cardiology. Patient verbalized understanding of results and Dr. Bari Mantis recommendations for weight loss.

## 2019-07-20 ENCOUNTER — Ambulatory Visit (INDEPENDENT_AMBULATORY_CARE_PROVIDER_SITE_OTHER): Payer: Medicare HMO | Admitting: *Deleted

## 2019-07-20 DIAGNOSIS — I495 Sick sinus syndrome: Secondary | ICD-10-CM | POA: Diagnosis not present

## 2019-07-20 LAB — CUP PACEART REMOTE DEVICE CHECK
Battery Impedance: 653 Ohm
Battery Remaining Longevity: 87 mo
Battery Voltage: 2.77 V
Brady Statistic AP VP Percent: 0 %
Brady Statistic AP VS Percent: 93 %
Brady Statistic AS VP Percent: 0 %
Brady Statistic AS VS Percent: 6 %
Date Time Interrogation Session: 20210624115613
Implantable Lead Implant Date: 20050106
Implantable Lead Implant Date: 20131122
Implantable Lead Location: 753859
Implantable Lead Location: 753860
Implantable Lead Model: 5092
Implantable Lead Model: 5594
Implantable Pulse Generator Implant Date: 20131122
Lead Channel Impedance Value: 442 Ohm
Lead Channel Impedance Value: 698 Ohm
Lead Channel Pacing Threshold Amplitude: 0.5 V
Lead Channel Pacing Threshold Amplitude: 0.625 V
Lead Channel Pacing Threshold Pulse Width: 0.4 ms
Lead Channel Pacing Threshold Pulse Width: 0.4 ms
Lead Channel Setting Pacing Amplitude: 1.5 V
Lead Channel Setting Pacing Amplitude: 2 V
Lead Channel Setting Pacing Pulse Width: 0.4 ms
Lead Channel Setting Sensing Sensitivity: 4 mV

## 2019-07-24 NOTE — Progress Notes (Signed)
Remote pacemaker transmission.   

## 2019-08-09 ENCOUNTER — Other Ambulatory Visit: Payer: Self-pay

## 2019-08-09 ENCOUNTER — Ambulatory Visit (INDEPENDENT_AMBULATORY_CARE_PROVIDER_SITE_OTHER): Payer: Medicare HMO | Admitting: Nurse Practitioner

## 2019-08-09 ENCOUNTER — Encounter: Payer: Self-pay | Admitting: Nurse Practitioner

## 2019-08-09 VITALS — BP 120/78 | HR 77 | Temp 98.1°F | Ht 68.8 in | Wt 221.6 lb

## 2019-08-09 DIAGNOSIS — R5382 Chronic fatigue, unspecified: Secondary | ICD-10-CM

## 2019-08-09 DIAGNOSIS — R0600 Dyspnea, unspecified: Secondary | ICD-10-CM | POA: Diagnosis not present

## 2019-08-09 DIAGNOSIS — R7309 Other abnormal glucose: Secondary | ICD-10-CM

## 2019-08-09 DIAGNOSIS — R0609 Other forms of dyspnea: Secondary | ICD-10-CM

## 2019-08-09 DIAGNOSIS — I10 Essential (primary) hypertension: Secondary | ICD-10-CM | POA: Diagnosis not present

## 2019-08-09 MED ORDER — IPRATROPIUM BROMIDE 0.06 % NA SOLN
2.0000 | Freq: Three times a day (TID) | NASAL | 2 refills | Status: DC
Start: 2019-08-09 — End: 2021-08-21

## 2019-08-09 MED ORDER — CANDESARTAN CILEXETIL-HCTZ 32-25 MG PO TABS: 1.0000 | ORAL_TABLET | Freq: Every day | ORAL | 1 refills | Status: DC

## 2019-08-09 MED ORDER — AMLODIPINE BESYLATE 10 MG PO TABS: 10.0000 mg | ORAL_TABLET | Freq: Every day | ORAL | 1 refills | Status: DC

## 2019-08-09 NOTE — Progress Notes (Signed)
Established Patient Office Visit  Subjective:  Patient ID: Aaron Snyder, male    DOB: [birth year]  Age: 69 y.o. MRN: [id number]  CC:  Chief Complaint  Patient presents with  . Hypertension    HPI Aaron Snyder presents for 6 month HTN follow up. He is doing okay but states that he has chronic back pain that has been bothersome. He had a PHT by Dr. [name] he states that his breathing problem may be related to weight and he may need to follow up with Dr. [name]. He is able to exercise in the gym but can only tolerate the bike 60 minutes each time. He is doing strength training daily. He is watching his diet since COVID and is still trying to lose the COVID weight. He was released from the nephrologist because his kidney function is stable, she mentioned if the levels went up to 1.85 to come back to the office. He is no longer using the inhalers per Dr. [name]. He use to see a counselor in the 90's and has been having increased anxiety and some insomnia.  He is considering going back to see [name].    He does relate similar symptoms when he was being treated with allergy injections.   Past Medical History:  Diagnosis Date  . Coronary artery disease   . Dysphagia   . Head injury   . Hypertension   . Pacemaker generator end of life 12/18/2011   Medtronic  . Presence of permanent cardiac pacemaker 02/01/2003   Medtronic  . Syncope     Past Surgical History:  Procedure Laterality Date  . CARDIAC CATHETERIZATION N/A 12/26/2014   Procedure: Left Heart Cath and Coronary Angiography;  Surgeon: [name], MD;  Location: [facility];  Service: Cardiovascular;  Laterality: N/A;  . ESOPHAGOGASTRODUODENOSCOPY     [place]  . LEFT AND RIGHT HEART CATHETERIZATION WITH CORONARY ANGIOGRAM N/A 12/17/2011   Procedure: LEFT AND RIGHT HEART CATHETERIZATION WITH CORONARY ANGIOGRAM;  Surgeon: [name], MD;  Location: [facility] CATH [facility];  Service: Cardiovascular;  Laterality: N/A;  . LOOP  RECORDER IMPLANT/EXPLANT  2005  . NM MYOCAR PERF WALL MOTION  01/2011   Negative  . PACEMAKER GENERATOR CHANGE  12/18/2011   Medtronic  . PACEMAKER INSERTION  2005   Medtronic  . PERMANENT PACEMAKER GENERATOR CHANGE N/A 12/18/2011   Procedure: PERMANENT PACEMAKER GENERATOR CHANGE;  Surgeon: [name], MD;  Location: [facility] CATH LAB;  Service: Cardiovascular;  Laterality: N/A;  . TOTAL SHOULDER ARTHROPLASTY      Family History  Problem Relation Age of Onset  . Diabetes Father   . Hypertension Father     Social History   Socioeconomic History  . Marital status: Widowed    Spouse name: Not on file  . Number of children: Not on file  . Years of education: Not on file  . Highest education level: Not on file  Occupational History  . Occupation: Disabled    Employer: UNEMPLOYED  Tobacco Use  . Smoking status: Former Smoker    Packs/day: 1.00    Years: 15.00    Pack years: 15.00    Types: Cigarettes    Quit date: 03/15/1996    Years since quitting: 23.4  . Smokeless tobacco: Never Used  Vaping Use  . Vaping Use: Never used  Substance and Sexual Activity  . Alcohol use: No  . Drug use: Yes    Types: Marijuana  . Sexual activity: Not  Currently  Other Topics Concern  . Not on file  Social History Narrative  . Not on file   Social Determinants of Health   Financial Resource Strain: Low Risk   . Difficulty of Paying Living Expenses: Not hard at all  Food Insecurity: No Food Insecurity  . Worried About Charity fundraiser in the Last Year: Never true  . Ran Out of Food in the Last Year: Never true  Transportation Needs: No Transportation Needs  . Lack of Transportation (Medical): No  . Lack of Transportation (Non-Medical): No  Physical Activity: Sufficiently Active  . Days of Exercise per Week: 7 days  . Minutes of Exercise per Session: 90 min  Stress: No Stress Concern Present  . Feeling of Stress : Only a little  Social Connections:   . Frequency of Communication  with Friends and Family:   . Frequency of Social Gatherings with Friends and Family:   . Attends Religious Services:   . Active Member of Clubs or Organizations:   . Attends Archivist Meetings:   [name] Marital Status:   Intimate Partner Violence:   . Fear of Current or Ex-Partner:   . Emotionally Abused:   [name] Physically Abused:   . Sexually Abused:     Outpatient Medications Prior to Visit  Medication Sig Dispense Refill  . aspirin EC 81 MG tablet Take 81 mg by mouth daily.    . cholecalciferol (VITAMIN D3) 25 MCG (1000 UNIT) tablet Take 1,000 Units by mouth daily.    [name] loratadine (CLARITIN) 10 MG tablet Take 10 mg by mouth daily.    . Multiple Vitamins-Minerals (MULTIVITAMIN WITH MINERALS) tablet Take 1 tablet by mouth daily.    . prednisoLONE acetate (PRED FORTE) 1 % ophthalmic suspension Place 1 drop into the left eye 4 (four) times daily. 5 mL 1  . vitamin C (ASCORBIC ACID) 250 MG tablet Take 250 mg by mouth daily.    . Zinc 50 MG CAPS Take 1 capsule by mouth daily.    [name] amLODipine (NORVASC) 10 MG tablet Take 1 tablet (10 mg total) by mouth daily. 90 tablet 1  . Candesartan Cilexetil-HCTZ 32-25 MG TABS Take 1 tablet by mouth daily. 90 tablet 1  . glucosamine-chondroitin 500-400 MG tablet Take 1 tablet by mouth daily. (Patient not taking: Reported on 08/09/2019)     No facility-administered medications prior to visit.    Allergies  Allergen Reactions  . Apple Anaphylaxis  . Carrot [Daucus Carota] Anaphylaxis  . Iodinated Diagnostic Agents Shortness Of Breath and Swelling    PER PATIENT BREAK THROUGH REACTION WITH PRE MEDS-2016-----Can have contrast as long as he has a 13 prep  . Iohexol Shortness Of Breath and Swelling    Pt requires 13hr premeds.  . Oat Anaphylaxis  . Soy Allergy Anaphylaxis  . Wheat Anaphylaxis  . Almond Meal Itching  . Cow's Milk [Lac Bovis]     Hives, and upset stomach     ROS Review of Systems  Constitutional: Negative.   HENT: Negative.    Eyes: Negative.   Respiratory: Negative for wheezing and stridor.   Cardiovascular: Negative.  Negative for palpitations.  Gastrointestinal: Negative.   Endocrine: Negative.   Genitourinary: Negative.   Musculoskeletal: Negative.  Negative for back pain.  Skin: Negative.   Allergic/Immunologic: Negative.   Neurological: Negative.   Psychiatric/Behavioral: Positive for behavioral problems.       He is anxious and is having some insomnia nightly  Objective:    Physical Exam Constitutional:      General: He is not in acute distress.    Appearance: Normal appearance.  HENT:     Head: Normocephalic and atraumatic.  Cardiovascular:     Rate and Rhythm: Normal rate.     Pulses: Normal pulses.     Heart sounds: Normal heart sounds.     Comments: Paced Pulmonary:     Effort: Pulmonary effort is normal. No respiratory distress.     Breath sounds: Normal breath sounds. No stridor. No wheezing.  Musculoskeletal:        General: Normal range of motion.     Cervical back: Normal range of motion.  Skin:    General: Skin is warm and dry.     Capillary Refill: Capillary refill takes less than 2 seconds.  Neurological:     Mental Status: He is alert.  Psychiatric:        Mood and Affect: Mood normal.        Behavior: Behavior normal.        Thought Content: Thought content normal.        Judgment: Judgment normal.     Comments: Some complaints of anxiety and insomnia     BP 120/78 (BP Location: Left Arm, Patient Position: Sitting, Cuff Size: Small)   Pulse 77   Temp 98.1 F (36.7 C) (Oral)   Ht 5' 8.8" (1.748 m)   Wt 221 lb 9.6 oz (100.5 kg)   BMI 32.92 kg/m  Wt Readings from Last 3 Encounters:  08/09/19 221 lb 9.6 oz (100.5 kg)  04/04/19 226 lb (102.5 kg)  03/13/19 226 lb (102.5 kg)     Health Maintenance Due  Topic Date Due  . TETANUS/TDAP  Never done    There are no preventive care reminders to display for this patient.  Lab Results  Component Value Date     TSH 1.230 02/03/2018   Lab Results  Component Value Date   WBC 5.5 02/09/2019   HGB 15.7 02/09/2019   HCT 45.7 02/09/2019   MCV 85 02/09/2019   PLT 205 02/09/2019   Lab Results  Component Value Date   NA 139 02/09/2019   K 4.0 02/09/2019   CO2 23 02/09/2019   GLUCOSE 100 (H) 02/09/2019   BUN 23 02/09/2019   CREATININE 1.48 (H) 02/09/2019   BILITOT 0.4 02/09/2019   ALKPHOS 108 02/09/2019   AST 32 02/09/2019   ALT 20 02/09/2019   PROT 7.0 02/09/2019   ALBUMIN 4.5 02/09/2019   CALCIUM 9.6 02/09/2019   ANIONGAP 8 12/26/2014   Lab Results  Component Value Date   CHOL 159 02/09/2019   Lab Results  Component Value Date   HDL 53 02/09/2019   Lab Results  Component Value Date   LDLCALC 94 02/09/2019   Lab Results  Component Value Date   TRIG 59 02/09/2019   Lab Results  Component Value Date   CHOLHDL 3.0 02/09/2019   Lab Results  Component Value Date   HGBA1C 5.6 02/09/2019      Assessment & Plan:   Problem List Items Addressed This Visit      Cardiovascular and Mediastinum   Essential hypertension - Primary   Relevant Medications   amLODipine (NORVASC) 10 MG tablet   Candesartan Cilexetil-HCTZ 32-25 MG TABS   Other Relevant Orders   CMP14+EGFR   Lipid panel     Other   Chronic fatigue   Relevant Orders   VITAMIN D 25  Hydroxy (Vit-D Deficiency, Fractures)   Dyspnea on exertion   Relevant Orders   Allergens(96) Foods   Allergens, Zone 3   Abnormal glucose   Relevant Orders   Hemoglobin A1c     1. Essential hypertension . B/P is controlled.  . CMP ordered to check renal function.  . continue regular exercise and dietary modification was stressed to the patient.  - CMP14+EGFR - Lipid panel - amLODipine (NORVASC) 10 MG tablet; Take 1 tablet (10 mg total) by mouth daily.  Dispense: 90 tablet; Refill: 1 - Candesartan Cilexetil-HCTZ 32-25 MG TABS; Take 1 tablet by mouth daily.  Dispense: 90 tablet; Refill: 1  2. Abnormal glucose  Chronic,  stable.  No current medications, diet and exercise controlled - Hemoglobin A1c  3. Dyspnea on exertion  He has seen Pulmonary and cardiology with no findings  He does have a history of allergies so I will treat him with ipratropium nasal spray to see if this is effective - Allergens(96) Foods - Allergens, Zone 3  4. Chronic fatigue  - VITAMIN D 25 Hydroxy (Vit-D Deficiency, Fractures)   Meds ordered this encounter  Medications  . amLODipine (NORVASC) 10 MG tablet    Sig: Take 1 tablet (10 mg total) by mouth daily.    Dispense:  90 tablet    Refill:  1  . Candesartan Cilexetil-HCTZ 32-25 MG TABS    Sig: Take 1 tablet by mouth daily.    Dispense:  90 tablet    Refill:  1  . ipratropium (ATROVENT) 0.06 % nasal spray    Sig: Place 2 sprays into the nose 3 (three) times daily.    Dispense:  15 mL    Refill:  2    Follow-up: Return for 6 month follow up.    [name], FNP

## 2019-08-12 LAB — ALLERGENS, ZONE 3
Alternaria Alternata IgE: <0.1 kU/L
Aspergillus Fumigatus IgE: <0.1 kU/L
Bahia Grass IgE: 0.17 kU/L — AB
Bermuda Grass IgE: 0.15 kU/L — AB
Cat Dander IgE: <0.1 kU/L
Cedar, Mountain IgE: <0.1 kU/L
Cladosporium Herbarum IgE: <0.1 kU/L
Cockroach, American IgE: <0.1 kU/L
Common Silver Birch IgE: 8.22 kU/L — AB
D Farinae IgE: <0.1 kU/L
D Pteronyssinus IgE: <0.1 kU/L
Dog Dander IgE: <0.1 kU/L
Elm, American IgE: 0.14 kU/L — AB
Hickory, White IgE: 0.39 kU/L — AB
Johnson Grass IgE: <0.1 kU/L
Kentucky Bluegrass IgE: 0.14 kU/L — AB
Maple/Box Elder IgE: 0.15 kU/L — AB
Mucor Racemosus IgE: <0.1 kU/L
Nettle IgE: 0.12 kU/L — AB
Oak, White IgE: 7.71 kU/L — AB
Penicillium Chrysogen IgE: <0.1 kU/L
Pigweed, Rough IgE: 0.11 kU/L — AB
Plantain, English IgE: <0.1 kU/L
Ragweed, Short IgE: 0.11 kU/L — AB
Stemphylium Herbarum IgE: <0.1 kU/L
White Mulberry IgE: <0.1 kU/L

## 2019-08-12 LAB — ALLERGENS(96) FOODS
Allergen Apple, IgE: 1.99 kU/L — AB
Allergen Banana IgE: <0.1 kU/L
Allergen Barley IgE: <0.1 kU/L
Allergen Black Pepper IgE: <0.1 kU/L
Allergen Blueberry IgE: <0.1 kU/L
Allergen Broccoli: <0.1 kU/L
Allergen Cabbage IgE: <0.1 kU/L
Allergen Carrot IgE: 1.61 kU/L — AB
Allergen Cauliflower IgE: <0.1 kU/L
Allergen Celery IgE: 1.58 kU/L — AB
Allergen Cinnamon IgE: <0.1 kU/L
Allergen Coconut IgE: <0.1 kU/L
Allergen Corn, IgE: <0.1 kU/L
Allergen Cucumber IgE: <0.1 kU/L
Allergen Garlic IgE: <0.1 kU/L
Allergen Ginger IgE: <0.1 kU/L
Allergen Gluten IgE: <0.1 kU/L
Allergen Grape IgE: <0.1 kU/L
Allergen Grapefruit IgE: 0.12 kU/L — AB
Allergen Green Bean IgE: <0.1 kU/L
Allergen Green Bell Pepper IgE: <0.1 kU/L
Allergen Green Pea IgE: <0.1 kU/L
Allergen Lamb IgE: <0.1 kU/L
Allergen Lettuce IgE: <0.1 kU/L
Allergen Lime IgE: <0.1 kU/L
Allergen Melon IgE: <0.1 kU/L
Allergen Oat IgE: <0.1 kU/L
Allergen Onion IgE: <0.1 kU/L
Allergen Pear IgE: 1.63 kU/L — AB
Allergen Potato, White IgE: <0.1 kU/L
Allergen Rice IgE: <0.1 kU/L
Allergen Salmon IgE: <0.1 kU/L
Allergen Strawberry IgE: <0.1 kU/L
Allergen Sweet Potato IgE: <0.1 kU/L
Allergen Tomato, IgE: <0.1 kU/L
Allergen Turkey IgE: <0.1 kU/L
Allergen Watermelon IgE: <0.1 kU/L
Allergen, Peach f95: 1.09 kU/L — AB
Basil: <0.1 kU/L
Beef IgE: <0.1 kU/L
C074-IgE Gelatin: <0.1 kU/L
Chicken IgE: <0.1 kU/L
Chocolate/Cacao IgE: <0.1 kU/L
Clam IgE: <0.1 kU/L
Codfish IgE: <0.1 kU/L
Coffee: <0.1 kU/L
Cranberry IgE: <0.1 kU/L
Egg White IgE: <0.1 kU/L
F020-IgE Almond: 0.25 kU/L — AB
F023-IgE Crab: <0.1 kU/L
F045-IgE Yeast: <0.1 kU/L
F076-IgE Alpha Lactalbumin: <0.1 kU/L
F077-IgE Beta Lactoglobulin: <0.1 kU/L
F078-IgE Casein: <0.1 kU/L
F080-IgE Lobster: <0.1 kU/L
F081-IgE Cheese, Cheddar Type: <0.1 kU/L
F089-IgE Mustard: <0.1 kU/L
F096-IgE Avocado: <0.1 kU/L
F202-IgE Cashew Nut: <0.1 kU/L
F214-IgE Spinach: <0.1 kU/L
F222-IgE Tea: <0.1 kU/L
F242-IgE Bing Cherry: 0.18 kU/L — AB
F247-IgE Honey: <0.1 kU/L
F261-IgE Asparagus: <0.1 kU/L
F262-IgE Eggplant: <0.1 kU/L
F265-IgE Cumin: <0.1 kU/L
F278-IgE Bayleaf (Laurel): <0.1 kU/L
F279-IgE Chili Pepper: <0.1 kU/L
F283-IgE Oregano: <0.1 kU/L
F300-IgE Goat's Milk: <0.1 kU/L
F342-IgE Olive, Black: <0.1 kU/L
F343-IgE Raspberry: <0.1 kU/L
Hops: <0.1 kU/L
IgE Egg (Yolk): <0.1 kU/L
Kidney Bean IgE: <0.1 kU/L
Lemon: 0.34 kU/L — AB
Lima Bean IgE: <0.1 kU/L
Malt: <0.1 kU/L
Mushroom IgE: <0.1 kU/L
Orange: <0.1 kU/L
Peanut IgE: <0.1 kU/L
Pineapple IgE: <0.1 kU/L
Pork IgE: <0.1 kU/L
Pumpkin IgE: <0.1 kU/L
Red Beet: <0.1 kU/L
Rye IgE: <0.1 kU/L
Scallop IgE: <0.1 kU/L
Sesame Seed IgE: <0.1 kU/L
Shrimp IgE: <0.1 kU/L
Soybean IgE: <0.1 kU/L
Tuna: <0.1 kU/L
Vanilla: <0.1 kU/L
Walnut IgE: <0.1 kU/L
Wheat IgE: <0.1 kU/L
Whey: <0.1 kU/L
White Bean IgE: <0.1 kU/L

## 2019-08-12 LAB — CMP14+EGFR
ALT: 15 IU/L (ref 0–44)
AST: 27 IU/L (ref 0–40)
Albumin/Globulin Ratio: 2 (ref 1.2–2.2)
Albumin: 4.6 g/dL (ref 3.8–4.8)
Alkaline Phosphatase: 102 IU/L (ref 48–121)
BUN/Creatinine Ratio: 13 (ref 10–24)
BUN: 18 mg/dL (ref 8–27)
Bilirubin Total: 0.4 mg/dL (ref 0.0–1.2)
CO2: 25 mmol/L (ref 20–29)
Calcium: 9.8 mg/dL (ref 8.6–10.2)
Chloride: 101 mmol/L (ref 96–106)
Creatinine, Ser: 1.39 mg/dL — ABNORMAL HIGH (ref 0.76–1.27)
GFR calc Af Amer: 59 mL/min/{1.73_m2} — ABNORMAL LOW (ref >59)
GFR calc non Af Amer: 51 mL/min/{1.73_m2} — ABNORMAL LOW (ref >59)
Globulin, Total: 2.3 g/dL (ref 1.5–4.5)
Glucose: 108 mg/dL — ABNORMAL HIGH (ref 65–99)
Potassium: 4.1 mmol/L (ref 3.5–5.2)
Sodium: 139 mmol/L (ref 134–144)
Total Protein: 6.9 g/dL (ref 6.0–8.5)

## 2019-08-12 LAB — LIPID PANEL
Chol/HDL Ratio: 3 ratio (ref 0.0–5.0)
Cholesterol, Total: 163 mg/dL (ref 100–199)
HDL: 55 mg/dL (ref >39)
LDL Chol Calc (NIH): 96 mg/dL (ref 0–99)
Triglycerides: 61 mg/dL (ref 0–149)
VLDL Cholesterol Cal: 12 mg/dL (ref 5–40)

## 2019-08-12 LAB — HEMOGLOBIN A1C
Est. average glucose Bld gHb Est-mCnc: 117 mg/dL
Hgb A1c MFr Bld: 5.7 % — ABNORMAL HIGH (ref 4.8–5.6)

## 2019-08-12 LAB — VITAMIN D 25 HYDROXY (VIT D DEFICIENCY, FRACTURES): Vit D, 25-Hydroxy: 28.7 ng/mL — ABNORMAL LOW (ref 30.0–100.0)

## 2019-08-14 ENCOUNTER — Other Ambulatory Visit: Payer: Self-pay | Admitting: Nurse Practitioner

## 2019-08-14 ENCOUNTER — Other Ambulatory Visit: Payer: Self-pay

## 2019-08-14 DIAGNOSIS — Z9109 Other allergy status, other than to drugs and biological substances: Secondary | ICD-10-CM

## 2019-08-14 DIAGNOSIS — Z91018 Allergy to other foods: Secondary | ICD-10-CM

## 2019-08-14 DIAGNOSIS — R0600 Dyspnea, unspecified: Secondary | ICD-10-CM

## 2019-08-14 DIAGNOSIS — R0609 Other forms of dyspnea: Secondary | ICD-10-CM

## 2019-08-14 MED ORDER — VITAMIN D (ERGOCALCIFEROL) 1.25 MG (50000 UNIT) PO CAPS
50000.0000 [IU] | ORAL_CAPSULE | ORAL | 0 refills | Status: DC
Start: 2019-08-14 — End: 2019-10-30

## 2019-09-16 ENCOUNTER — Emergency Department (HOSPITAL_COMMUNITY)
Admission: EM | Admit: 2019-09-16 | Discharge: 2019-09-16 | Disposition: A | Discharge status: Home / Self Care | Payer: Medicare HMO | Attending: Emergency Medicine | Admitting: Emergency Medicine

## 2019-09-16 ENCOUNTER — Other Ambulatory Visit: Payer: Self-pay

## 2019-09-16 ENCOUNTER — Emergency Department (HOSPITAL_COMMUNITY): Payer: Medicare HMO

## 2019-09-16 ENCOUNTER — Encounter (HOSPITAL_COMMUNITY): Payer: Self-pay | Admitting: Emergency Medicine

## 2019-09-16 DIAGNOSIS — I2510 Atherosclerotic heart disease of native coronary artery without angina pectoris: Secondary | ICD-10-CM | POA: Diagnosis not present

## 2019-09-16 DIAGNOSIS — R42 Dizziness and giddiness: Secondary | ICD-10-CM | POA: Diagnosis not present

## 2019-09-16 DIAGNOSIS — I10 Essential (primary) hypertension: Secondary | ICD-10-CM | POA: Insufficient documentation

## 2019-09-16 DIAGNOSIS — H81399 Other peripheral vertigo, unspecified ear: Secondary | ICD-10-CM | POA: Diagnosis not present

## 2019-09-16 DIAGNOSIS — Z79899 Other long term (current) drug therapy: Secondary | ICD-10-CM | POA: Diagnosis not present

## 2019-09-16 DIAGNOSIS — Z950 Presence of cardiac pacemaker: Secondary | ICD-10-CM | POA: Diagnosis not present

## 2019-09-16 DIAGNOSIS — G319 Degenerative disease of nervous system, unspecified: Secondary | ICD-10-CM | POA: Insufficient documentation

## 2019-09-16 DIAGNOSIS — H5500 Unspecified nystagmus: Secondary | ICD-10-CM | POA: Diagnosis not present

## 2019-09-16 LAB — BASIC METABOLIC PANEL
Anion gap: 13 (ref 5–15)
BUN: 17 mg/dL (ref 8–23)
CO2: 25 mmol/L (ref 22–32)
Calcium: 9.3 mg/dL (ref 8.9–10.3)
Chloride: 101 mmol/L (ref 98–111)
Creatinine, Ser: 1.34 mg/dL — ABNORMAL HIGH (ref 0.61–1.24)
GFR calc Af Amer: >60 mL/min (ref >60)
GFR calc non Af Amer: 53 mL/min — ABNORMAL LOW (ref >60)
Glucose, Bld: 102 mg/dL — ABNORMAL HIGH (ref 70–99)
Potassium: 3.1 mmol/L — ABNORMAL LOW (ref 3.5–5.1)
Sodium: 139 mmol/L (ref 135–145)

## 2019-09-16 LAB — CBC
HCT: 45.8 % (ref 39.0–52.0)
Hemoglobin: 15 g/dL (ref 13.0–17.0)
MCH: 28.6 pg (ref 26.0–34.0)
MCHC: 32.8 g/dL (ref 30.0–36.0)
MCV: 87.2 fL (ref 80.0–100.0)
Platelets: 209 10*3/uL (ref 150–400)
RBC: 5.25 MIL/uL (ref 4.22–5.81)
RDW: 14.5 % (ref 11.5–15.5)
WBC: 5.6 10*3/uL (ref 4.0–10.5)
nRBC: 0 % (ref 0.0–0.2)

## 2019-09-16 MED ORDER — SODIUM CHLORIDE 0.9% FLUSH: 3.0000 mL | Freq: Once | INTRAVENOUS | Status: DC

## 2019-09-16 MED ORDER — MECLIZINE HCL 12.5 MG PO TABS: 12.5000 mg | ORAL_TABLET | Freq: Three times a day (TID) | ORAL | 0 refills | Status: DC | PRN

## 2019-09-16 NOTE — ED Provider Notes (Signed)
[facility] EMERGENCY DEPARTMENT Provider Note   CSN: [id number] Arrival date & time: 09/16/19  1109     History Chief Complaint  Patient presents with  . Dizziness    Aaron Snyder is a 69 y.o. male.  HPI   Patient presents ED for evaluation of dizziness.  Patient states symptoms started several days ago.  He has been noticing episodes of dizziness that get worse with head positions and movements.  Patient feels better when he tries to avoid moving his head quickly.  Patient does have history of prior traumatic brain injury as well as is a central vertigo in the past however this episode is lasting longer than he is experienced in the past.  He does feel like he is a bit unsteady as if he was intoxicated but is not specifically having difficulties with his gait, numbness or weakness.  He is not having trouble with his speech.  Patient went to an urgent care.  While he was there they did a Dix-Hallpike maneuver.  Patient's symptoms became severely worse.  They were concerned and felt he needed to come to the ED for further evaluation  Past Medical History:  Diagnosis Date  . Coronary artery disease   . Dysphagia   . Head injury   . Hypertension   . Pacemaker generator end of life 12/18/2011   Medtronic  . Presence of permanent cardiac pacemaker 02/01/2003   Medtronic  . Syncope     Patient Active Problem List   Diagnosis Date Noted  . Abnormal glucose 08/09/2019  . Dyspnea on exertion 04/04/2019  . Chronic fatigue 02/03/2018  . Thoracic aortic aneurysm ([place]) 08/19/2017  . Chronotropic incompetence with sinus node dysfunction ([facility]) 02/22/2015  . Abnormal nuclear stress test   . Atypical chest pain 11/24/2013  . Pleuritic chest pain 10/15/2013  . LUQ pain 10/15/2013  . Abnormal computed tomography of duodenum 10/14/2013  . Abdominal pain 10/14/2013  . Essential hypertension 12/07/2012  . S/P cardiac pacemaker procedure, 12/18/11, new generator  12/18/2011  . Syncope X 3 per the pt. 12/18/2011  . Dysphagia 12/16/2011  . Unstable angina, admitted with SSCP and arm pain 12/14/11, neg MI, no obstructive CAD by cath 12/17/11 12/16/2011  . Abnormal echocardiogram, EF 40-45% 12/15/11 (new c/w Sep 2013) 12/16/2011  . Contrast media allergy 12/16/2011  . Positive D dimer, VQ low risk 12/15/2011  . Chest pain 12/14/2011  . Pacemaker - dual chamber Medtronic 2004, new generator 2013 12/14/2011  . Coronary artery disease nonobstructive cath 2005 and 2016, false positive Myoview Nov 2016.   [name] Hypertension     Past Surgical History:  Procedure Laterality Date  . CARDIAC CATHETERIZATION N/A 12/26/2014   Procedure: Left Heart Cath and Coronary Angiography;  Surgeon: [name], MD;  Location: [facility];  Service: Cardiovascular;  Laterality: N/A;  . ESOPHAGOGASTRODUODENOSCOPY     [place]  . LEFT AND RIGHT HEART CATHETERIZATION WITH CORONARY ANGIOGRAM N/A 12/17/2011   Procedure: LEFT AND RIGHT HEART CATHETERIZATION WITH CORONARY ANGIOGRAM;  Surgeon: [name], MD;  Location: [facility] CATH LAB;  Service: Cardiovascular;  Laterality: N/A;  . LOOP RECORDER IMPLANT/EXPLANT  2005  . NM MYOCAR PERF WALL MOTION  01/2011   Negative  . PACEMAKER GENERATOR CHANGE  12/18/2011   Medtronic  . PACEMAKER INSERTION  2005   Medtronic  . PERMANENT PACEMAKER GENERATOR CHANGE N/A 12/18/2011   Procedure: PERMANENT PACEMAKER GENERATOR CHANGE;  Surgeon: [name], MD;  Location: [facility]  CATH LAB;  Service: Cardiovascular;  Laterality: N/A;  . TOTAL SHOULDER ARTHROPLASTY         Family History  Problem Relation Age of Onset  . Diabetes Father   . Hypertension Father     Social History   Tobacco Use  . Smoking status: Former Smoker    Packs/day: 1.00    Years: 15.00    Pack years: 15.00    Types: Cigarettes    Quit date: 03/15/1996    Years since quitting: 23.5  . Smokeless tobacco: Never Used  Vaping Use  . Vaping Use: Never used    Substance Use Topics  . Alcohol use: No  . Drug use: Yes    Types: Marijuana    Home Medications Prior to Admission medications   Medication Sig Start Date End Date Taking? Authorizing Provider  amLODipine (NORVASC) 10 MG tablet Take 1 tablet (10 mg total) by mouth daily. 08/09/19   [name], FNP  aspirin EC 81 MG tablet Take 81 mg by mouth daily.    [provider]  Candesartan Cilexetil-HCTZ 32-25 MG TABS Take 1 tablet by mouth daily. 08/09/19   [name], FNP  cholecalciferol (VITAMIN D3) 25 MCG (1000 UNIT) tablet Take 1,000 Units by mouth daily.    [provider]  ipratropium (ATROVENT) 0.06 % nasal spray Place 2 sprays into the nose 3 (three) times daily. 08/09/19 08/08/20  [name], FNP  loratadine (CLARITIN) 10 MG tablet Take 10 mg by mouth daily.    [provider]  meclizine (ANTIVERT) 12.5 MG tablet Take 1 tablet (12.5 mg total) by mouth 3 (three) times daily as needed for dizziness. 09/16/19   Dorie Rank, MD  Multiple Vitamins-Minerals (MULTIVITAMIN WITH MINERALS) tablet Take 1 tablet by mouth daily.    [provider]  prednisoLONE acetate (PRED FORTE) 1 % ophthalmic suspension Place 1 drop into the left eye 4 (four) times daily. 08/29/17   Raylene Everts, MD  vitamin C (ASCORBIC ACID) 250 MG tablet Take 250 mg by mouth daily.    [provider]  Vitamin D, Ergocalciferol, (DRISDOL) 1.25 MG (50000 UNIT) CAPS capsule Take 1 capsule (50,000 Units total) by mouth every 7 (seven) days. 08/14/19   [name], FNP  Zinc 50 MG CAPS Take 1 capsule by mouth daily.    [provider]    Allergies    Apple, Carrot [daucus carota], Iodinated diagnostic agents, Iohexol, Oat, Soy allergy, Wheat, Almond meal, and Cow's milk [lac bovis]  Review of Systems   Review of Systems  All other systems reviewed and are negative.   Physical Exam Updated Vital Signs BP 128/89   Pulse 79   Temp 97.9 F (36.6 C) (Oral)   Resp  13   Ht 1.803 m (5\' 11" )   Wt 95.7 kg   SpO2 97%   BMI 29.43 kg/m   Physical Exam Vitals and nursing note reviewed.  Constitutional:      General: He is not in acute distress.    Appearance: He is well-developed.  HENT:     Head: Normocephalic and atraumatic.     Right Ear: External ear normal.     Left Ear: External ear normal.  Eyes:     General: No scleral icterus.       Right eye: No discharge.        Left eye: No discharge.     Conjunctiva/sclera: Conjunctivae normal.  Neck:     Trachea: No tracheal deviation.  Cardiovascular:     Rate and Rhythm: Normal rate and regular rhythm.  Pulmonary:     Effort: Pulmonary effort is normal. No respiratory distress.     Breath sounds: Normal breath sounds. No stridor. No wheezing or rales.  Abdominal:     General: Bowel sounds are normal. There is no distension.     Palpations: Abdomen is soft.     Tenderness: There is no abdominal tenderness. There is no guarding or rebound.  Musculoskeletal:        General: No tenderness.     Cervical back: Neck supple.  Skin:    General: Skin is warm and dry.     Findings: No rash.  Neurological:     Mental Status: He is alert and oriented to person, place, and time.     Cranial Nerves: No cranial nerve deficit (No facial droop, extraocular movements intact, tongue midline ).     Sensory: No sensory deficit.     Motor: No abnormal muscle tone or seizure activity.     Coordination: Coordination normal.     Comments: No pronator drift bilateral upper extrem, able to hold both legs off bed for 5 seconds, sensation intact in all extremities, no visual field cuts, no left or right sided neglect, normal finger-nose exam bilaterally, no nystagmus noted      ED Results / Procedures / Treatments   Labs (all labs ordered are listed, but only abnormal results are displayed) Labs Reviewed  BASIC METABOLIC PANEL - Abnormal; Notable for the following components:      Result Value   Potassium 3.1  (*)    Glucose, Bld 102 (*)    Creatinine, Ser 1.34 (*)    GFR calc non Af Amer 53 (*)    All other components within normal limits  CBC    EKG EKG Interpretation  Date/Time:  Saturday September 16 2019 11:15:21 EDT Ventricular Rate:  82 PR Interval:  200 QRS Duration: 84 QT Interval:  358 QTC Calculation: 418 R Axis:   -16 Text Interpretation: Atrial-paced rhythm Abnormal ECG No significant change since last tracing Confirmed by Dorie Rank [phone]) on 09/16/2019 5:45:40 PM   Radiology CT Head Wo Contrast  Result Date: 09/16/2019 CLINICAL DATA:  Vertigo since Wednesday. EXAM: CT HEAD WITHOUT CONTRAST TECHNIQUE: Contiguous axial images were obtained from the base of the skull through the vertex without intravenous contrast. COMPARISON:  December 14, 2011 FINDINGS: Brain: No evidence of acute infarction, hemorrhage, hydrocephalus, extra-axial collection or mass lesion/mass effect. Signs of generalized atrophy. Vascular: No hyperdense vessel or unexpected calcification. Skull: Normal. Negative for fracture or focal lesion. Sinuses/Orbits: No acute finding. Other: None. IMPRESSION: 1. No acute intracranial abnormality. 2. Signs of generalized atrophy. Electronically Signed   By: Zetta Bills M.D.   On: 09/16/2019 19:38    Procedures Procedures (including critical care time)  Medications Ordered in ED Medications  sodium chloride flush (NS) 0.9 % injection 3 mL (3 mLs Intravenous Not Given 09/16/19 1921)    ED Course  I have reviewed the triage vital signs and the nursing notes.  Pertinent labs & imaging results that were available during my care of the patient were reviewed by me and considered in my medical decision making (see chart for details).  Clinical Course as of Sep 16 2007  Sat Sep 16, 2019  1820 Patient states he was sent from an urgent care for specifically for a head CT.  Patient does not have any focal neurologic deficits on  exam.  He is not a candidate for an MRI  because of his pacemaker.  Patient has been waiting for several hours.  He is understanably concerned.  We will proceed with head CT   [JK]    Clinical Course User Index [JK] Dorie Rank, MD   MDM Rules/Calculators/A&P                          Patient presented to the ED for evaluation of dizziness.  Symptoms were concerning for vertigo.  Patient was sent in from another medical facility to receive brain imaging.  On my exam fortunately has no focal neurologic deficits.  CT scan does not show any acute abnormalities.  I doubt stroke/central vertigo.  MRI is not an option as patient does have a pacemaker.  Plan on discharge home with meclizine.  Discussed Epley maneuvers.  Warning signs and precautions discussed. Final Clinical Impression(s) / ED Diagnoses Final diagnoses:  Peripheral vertigo, unspecified laterality    Rx / DC Orders ED Discharge Orders         Ordered    meclizine (ANTIVERT) 12.5 MG tablet  3 times daily PRN        09/16/19 2008           Dorie Rank, MD 09/16/19 2011

## 2019-09-16 NOTE — Discharge Instructions (Addendum)
Try doing the Epley maneuvers at home.  Take the medication as needed for the dizziness.  Follow-up with your doctor if the symptoms persist.  Return to the ED for difficulties with your balance, coordination or other concerning symptoms

## 2019-09-16 NOTE — ED Triage Notes (Signed)
Pt states he went to [facility] today because he has had positional vertigo since Wednesday.  Reports history of vertigo x 15 years. Pt reports when [facility] did exam on him and he was on exam table with head off end, the vertigo was much worse.  Sent to ED for further eval.  No arm drift.  Denies numbness/weakness.

## 2019-10-16 DIAGNOSIS — R69 Illness, unspecified: Secondary | ICD-10-CM | POA: Diagnosis not present

## 2019-10-19 ENCOUNTER — Ambulatory Visit (INDEPENDENT_AMBULATORY_CARE_PROVIDER_SITE_OTHER): Payer: Medicare HMO | Admitting: Emergency Medicine

## 2019-10-19 DIAGNOSIS — I495 Sick sinus syndrome: Secondary | ICD-10-CM | POA: Diagnosis not present

## 2019-10-19 LAB — CUP PACEART REMOTE DEVICE CHECK
Battery Impedance: 703 Ohm
Battery Remaining Longevity: 84 mo
Battery Voltage: 2.77 V
Brady Statistic AP VP Percent: 1 %
Brady Statistic AP VS Percent: 93 %
Brady Statistic AS VP Percent: 0 %
Brady Statistic AS VS Percent: 7 %
Date Time Interrogation Session: 20210923101807
Implantable Lead Implant Date: 20050106
Implantable Lead Implant Date: 20131122
Implantable Lead Location: 753859
Implantable Lead Location: 753860
Implantable Lead Model: 5092
Implantable Lead Model: 5594
Implantable Pulse Generator Implant Date: 20131122
Lead Channel Impedance Value: 443 Ohm
Lead Channel Impedance Value: 698 Ohm
Lead Channel Pacing Threshold Amplitude: 0.5 V
Lead Channel Pacing Threshold Amplitude: 0.75 V
Lead Channel Pacing Threshold Pulse Width: 0.4 ms
Lead Channel Pacing Threshold Pulse Width: 0.4 ms
Lead Channel Setting Pacing Amplitude: 1.5 V
Lead Channel Setting Pacing Amplitude: 2 V
Lead Channel Setting Pacing Pulse Width: 0.4 ms
Lead Channel Setting Sensing Sensitivity: 4 mV

## 2019-10-23 ENCOUNTER — Encounter: Payer: Self-pay | Admitting: Nurse Practitioner

## 2019-10-24 NOTE — Progress Notes (Signed)
Remote pacemaker transmission.   

## 2019-10-30 ENCOUNTER — Other Ambulatory Visit: Payer: Self-pay | Admitting: Nurse Practitioner

## 2019-12-19 ENCOUNTER — Telehealth: Payer: Self-pay

## 2019-12-19 NOTE — Telephone Encounter (Signed)
I called and left pt vm letting him know that his handicap placard is ready. YL,RMA

## 2019-12-25 ENCOUNTER — Other Ambulatory Visit: Payer: Self-pay | Admitting: Nurse Practitioner

## 2019-12-25 DIAGNOSIS — I10 Essential (primary) hypertension: Secondary | ICD-10-CM

## 2020-01-18 ENCOUNTER — Ambulatory Visit (INDEPENDENT_AMBULATORY_CARE_PROVIDER_SITE_OTHER): Payer: Medicare HMO

## 2020-01-18 DIAGNOSIS — I495 Sick sinus syndrome: Secondary | ICD-10-CM

## 2020-01-18 LAB — CUP PACEART REMOTE DEVICE CHECK
Battery Impedance: 755 Ohm
Battery Remaining Longevity: 80 mo
Battery Voltage: 2.77 V
Brady Statistic AP VP Percent: 1 %
Brady Statistic AP VS Percent: 93 %
Brady Statistic AS VP Percent: 0 %
Brady Statistic AS VS Percent: 6 %
Date Time Interrogation Session: 20211223135529
Implantable Lead Implant Date: 20050106
Implantable Lead Implant Date: 20131122
Implantable Lead Location: 753859
Implantable Lead Location: 753860
Implantable Lead Model: 5092
Implantable Lead Model: 5594
Implantable Pulse Generator Implant Date: 20131122
Lead Channel Impedance Value: 409 Ohm
Lead Channel Impedance Value: 660 Ohm
Lead Channel Pacing Threshold Amplitude: 0.5 V
Lead Channel Pacing Threshold Amplitude: 0.625 V
Lead Channel Pacing Threshold Pulse Width: 0.4 ms
Lead Channel Pacing Threshold Pulse Width: 0.4 ms
Lead Channel Setting Pacing Amplitude: 1.5 V
Lead Channel Setting Pacing Amplitude: 2 V
Lead Channel Setting Pacing Pulse Width: 0.4 ms
Lead Channel Setting Sensing Sensitivity: 2.8 mV

## 2020-01-31 NOTE — Progress Notes (Signed)
Remote pacemaker transmission.   

## 2020-02-15 ENCOUNTER — Other Ambulatory Visit: Payer: Self-pay

## 2020-02-15 ENCOUNTER — Encounter: Payer: Self-pay | Admitting: Nurse Practitioner

## 2020-02-15 ENCOUNTER — Ambulatory Visit (INDEPENDENT_AMBULATORY_CARE_PROVIDER_SITE_OTHER): Payer: Medicare HMO

## 2020-02-15 ENCOUNTER — Ambulatory Visit (INDEPENDENT_AMBULATORY_CARE_PROVIDER_SITE_OTHER): Payer: Medicare HMO | Admitting: Nurse Practitioner

## 2020-02-15 VITALS — BP 132/82 | HR 76 | Temp 97.5°F | Ht 68.8 in | Wt 213.4 lb

## 2020-02-15 DIAGNOSIS — E559 Vitamin D deficiency, unspecified: Secondary | ICD-10-CM | POA: Diagnosis not present

## 2020-02-15 DIAGNOSIS — I10 Essential (primary) hypertension: Secondary | ICD-10-CM

## 2020-02-15 DIAGNOSIS — R69 Illness, unspecified: Secondary | ICD-10-CM | POA: Diagnosis not present

## 2020-02-15 DIAGNOSIS — R7309 Other abnormal glucose: Secondary | ICD-10-CM | POA: Diagnosis not present

## 2020-02-15 DIAGNOSIS — Z0000 Encounter for general adult medical examination without abnormal findings: Secondary | ICD-10-CM

## 2020-02-15 DIAGNOSIS — Z1211 Encounter for screening for malignant neoplasm of colon: Secondary | ICD-10-CM | POA: Diagnosis not present

## 2020-02-15 DIAGNOSIS — R590 Localized enlarged lymph nodes: Secondary | ICD-10-CM | POA: Diagnosis not present

## 2020-02-15 DIAGNOSIS — F419 Anxiety disorder, unspecified: Secondary | ICD-10-CM | POA: Diagnosis not present

## 2020-02-15 DIAGNOSIS — Z950 Presence of cardiac pacemaker: Secondary | ICD-10-CM

## 2020-02-15 DIAGNOSIS — Z23 Encounter for immunization: Secondary | ICD-10-CM

## 2020-02-15 LAB — POCT URINALYSIS DIPSTICK
Bilirubin, UA: NEGATIVE
Blood, UA: NEGATIVE
Glucose, UA: NEGATIVE
Ketones, UA: NEGATIVE
Nitrite, UA: NEGATIVE
Protein, UA: NEGATIVE
Spec Grav, UA: <=1.005 — AB (ref 1.010–1.025)
Urobilinogen, UA: 0.2 E.U./dL
pH, UA: 5 (ref 5.0–8.0)

## 2020-02-15 LAB — POCT UA - MICROALBUMIN
Creatinine, POC: 50 mg/dL
Microalbumin Ur, POC: 10 mg/L

## 2020-02-15 MED ORDER — BOOSTRIX 5-2.5-18.5 LF-MCG/0.5 IM SUSP: 0.5000 mL | Freq: Once | INTRAMUSCULAR | 0 refills | Status: AC

## 2020-02-15 MED ORDER — HYDROXYZINE PAMOATE 25 MG PO CAPS: 25.0000 mg | ORAL_CAPSULE | Freq: Three times a day (TID) | ORAL | 1 refills | Status: DC | PRN

## 2020-02-15 MED ORDER — SHINGRIX 50 MCG/0.5ML IM SUSR: 0.5000 mL | Freq: Once | INTRAMUSCULAR | 0 refills | Status: AC

## 2020-02-15 MED ORDER — AMOXICILLIN 875 MG PO TABS: 875.0000 mg | ORAL_TABLET | Freq: Two times a day (BID) | ORAL | 0 refills | Status: DC

## 2020-02-15 NOTE — Addendum Note (Signed)
Addended by: Kellie Simmering on: 02/15/2020 11:24 AM   Modules accepted: Orders

## 2020-02-15 NOTE — Patient Instructions (Addendum)

## 2020-02-15 NOTE — Patient Instructions (Signed)
Aaron Snyder , Thank you for taking time to come for your Medicare Wellness Visit. I appreciate your ongoing commitment to your health goals. Please review the following plan we discussed and let me know if I can assist you in the future.   Screening recommendations/referrals: Colonoscopy: cologuard 2018, due now Recommended yearly ophthalmology/optometry visit for glaucoma screening and checkup Recommended yearly dental visit for hygiene and checkup  Vaccinations: Influenza vaccine: completed 10/16/2019 Pneumococcal vaccine: completed 06/01/2019 Tdap vaccine: sent to pharmacy Shingles vaccine: sent to pharmacy   Covid-19:  10/28/2019, 04/11/2019, 03/21/2019  Advanced directives: Advance directive discussed with you today. Even though you declined this today please call our office should you change your mind and we can give you the proper paperwork for you to fill out.  Conditions/risks identified: none  Next appointment: Follow up in one year for your annual wellness visit.   Preventive Care 64 Years and Older, Male Preventive care refers to lifestyle choices and visits with your health care provider that can promote health and wellness. What does preventive care include?  A yearly physical exam. This is also called an annual well check.  Dental exams once or twice a year.  Routine eye exams. Ask your health care provider how often you should have your eyes checked.  Personal lifestyle choices, including:  Daily care of your teeth and gums.  Regular physical activity.  Eating a healthy diet.  Avoiding tobacco and drug use.  Limiting alcohol use.  Practicing safe sex.  Taking low doses of aspirin every day.  Taking vitamin and mineral supplements as recommended by your health care provider. What happens during an annual well check? The services and screenings done by your health care provider during your annual well check will depend on your age, overall health, lifestyle  risk factors, and family history of disease. Counseling  Your health care provider may ask you questions about your:  Alcohol use.  Tobacco use.  Drug use.  Emotional well-being.  Home and relationship well-being.  Sexual activity.  Eating habits.  History of falls.  Memory and ability to understand (cognition).  Work and work Statistician. Screening  You may have the following tests or measurements:  Height, weight, and BMI.  Blood pressure.  Lipid and cholesterol levels. These may be checked every 5 years, or more frequently if you are over 63 years old.  Skin check.  Lung cancer screening. You may have this screening every year starting at age 60 if you have a 30-pack-year history of smoking and currently smoke or have quit within the past 15 years.  Fecal occult blood test (FOBT) of the stool. You may have this test every year starting at age 48.  Flexible sigmoidoscopy or colonoscopy. You may have a sigmoidoscopy every 5 years or a colonoscopy every 10 years starting at age 59.  Prostate cancer screening. Recommendations will vary depending on your family history and other risks.  Hepatitis C blood test.  Hepatitis B blood test.  Sexually transmitted disease (STD) testing.  Diabetes screening. This is done by checking your blood sugar (glucose) after you have not eaten for a while (fasting). You may have this done every 1-3 years.  Abdominal aortic aneurysm (AAA) screening. You may need this if you are a current or former smoker.  Osteoporosis. You may be screened starting at age 63 if you are at high risk. Talk with your health care provider about your test results, treatment options, and if necessary, the need for more  tests. Vaccines  Your health care provider may recommend certain vaccines, such as:  Influenza vaccine. This is recommended every year.  Tetanus, diphtheria, and acellular pertussis (Tdap, Td) vaccine. You may need a Td booster every 10  years.  Zoster vaccine. You may need this after age 66.  Pneumococcal 13-valent conjugate (PCV13) vaccine. One dose is recommended after age 62.  Pneumococcal polysaccharide (PPSV23) vaccine. One dose is recommended after age 22. Talk to your health care provider about which screenings and vaccines you need and how often you need them. This information is not intended to replace advice given to you by your health care provider. Make sure you discuss any questions you have with your health care provider. Document Released: 02/08/2015 Document Revised: 10/02/2015 Document Reviewed: 11/13/2014 Elsevier Interactive Patient Education  2017 [place] Prevention in the Home Falls can cause injuries. They can happen to people of all ages. There are many things you can do to make your home safe and to help prevent falls. What can I do on the outside of my home?  Regularly fix the edges of walkways and driveways and fix any cracks.  Remove anything that might make you trip as you walk through a door, such as a raised step or threshold.  Trim any bushes or trees on the path to your home.  Use bright outdoor lighting.  Clear any walking paths of anything that might make someone trip, such as rocks or tools.  Regularly check to see if handrails are loose or broken. Make sure that both sides of any steps have handrails.  Any raised decks and porches should have guardrails on the edges.  Have any leaves, snow, or ice cleared regularly.  Use sand or salt on walking paths during winter.  Clean up any spills in your garage right away. This includes oil or grease spills. What can I do in the bathroom?  Use night lights.  Install grab bars by the toilet and in the tub and shower. Do not use towel bars as grab bars.  Use non-skid mats or decals in the tub or shower.  If you need to sit down in the shower, use a plastic, non-slip stool.  Keep the floor dry. Clean up any water that  spills on the floor as soon as it happens.  Remove soap buildup in the tub or shower regularly.  Attach bath mats securely with double-sided non-slip rug tape.  Do not have throw rugs and other things on the floor that can make you trip. What can I do in the bedroom?  Use night lights.  Make sure that you have a light by your bed that is easy to reach.  Do not use any sheets or blankets that are too big for your bed. They should not hang down onto the floor.  Have a firm chair that has side arms. You can use this for support while you get dressed.  Do not have throw rugs and other things on the floor that can make you trip. What can I do in the kitchen?  Clean up any spills right away.  Avoid walking on wet floors.  Keep items that you use a lot in easy-to-reach places.  If you need to reach something above you, use a strong step stool that has a grab bar.  Keep electrical cords out of the way.  Do not use floor polish or wax that makes floors slippery. If you must use wax, use non-skid floor  wax.  Do not have throw rugs and other things on the floor that can make you trip. What can I do with my stairs?  Do not leave any items on the stairs.  Make sure that there are handrails on both sides of the stairs and use them. Fix handrails that are broken or loose. Make sure that handrails are as long as the stairways.  Check any carpeting to make sure that it is firmly attached to the stairs. Fix any carpet that is loose or worn.  Avoid having throw rugs at the top or bottom of the stairs. If you do have throw rugs, attach them to the floor with carpet tape.  Make sure that you have a light switch at the top of the stairs and the bottom of the stairs. If you do not have them, ask someone to add them for you. What else can I do to help prevent falls?  Wear shoes that:  Do not have high heels.  Have rubber bottoms.  Are comfortable and fit you well.  Are closed at the  toe. Do not wear sandals.  If you use a stepladder:  Make sure that it is fully opened. Do not climb a closed stepladder.  Make sure that both sides of the stepladder are locked into place.  Ask someone to hold it for you, if possible.  Clearly mark and make sure that you can see:  Any grab bars or handrails.  First and last steps.  Where the edge of each step is.  Use tools that help you move around (mobility aids) if they are needed. These include:  Canes.  Walkers.  Scooters.  Crutches.  Turn on the lights when you go into a dark area. Replace any light bulbs as soon as they burn out.  Set up your furniture so you have a clear path. Avoid moving your furniture around.  If any of your floors are uneven, fix them.  If there are any pets around you, be aware of where they are.  Review your medicines with your doctor. Some medicines can make you feel dizzy. This can increase your chance of falling. Ask your doctor what other things that you can do to help prevent falls. This information is not intended to replace advice given to you by your health care provider. Make sure you discuss any questions you have with your health care provider. Document Released: 11/08/2008 Document Revised: 06/20/2015 Document Reviewed: 02/16/2014 Elsevier Interactive Patient Education  2017 [employer].

## 2020-02-15 NOTE — Progress Notes (Signed)
I,Yamilka Roman [employer] as a Education administrator for [employer], FNP.,have documented all relevant documentation on the behalf of [name], FNP,as directed by  [name], FNP while in the presence of [name], [place]. This visit occurred during the SARS-CoV-2 public health emergency.  Safety protocols were in place, including screening questions prior to the visit, additional usage of staff PPE, and extensive cleaning of exam room while observing appropriate contact time as indicated for disinfecting solutions.  Subjective:     Patient ID: Aaron Snyder , male    DOB: 1950/10/09 , 70 y.o.   MRN: [id number]   Chief Complaint  Patient presents with   Hypertension    HPI  Patient here for a blood pressure f/u.  Overall he is doing okay.  He is having an increased stress due to being the sole caregiver of his mother.  He has minimal help.    Hypertension This is a chronic problem. The current episode started more than 1 year ago. The problem is unchanged. The problem is controlled. Associated symptoms include anxiety. Pertinent negatives include no blurred vision, headaches or shortness of breath. Risk factors for coronary artery disease include obesity and sedentary lifestyle. There is no history of angina.     Past Medical History:  Diagnosis Date   Coronary artery disease    Dysphagia    Head injury    Hypertension    Pacemaker generator end of life 12/18/2011   [employer]   Presence of permanent cardiac pacemaker 02/01/2003   [employer]   Syncope      Family History  Problem Relation Age of Onset   Diabetes Father    Hypertension Father      Current Outpatient Medications:    amoxicillin (AMOXIL) 875 MG tablet, Take 1 tablet (875 mg total) by mouth 2 (two) times daily., Disp: 14 tablet, Rfl: 0   hydrOXYzine (VISTARIL) 25 MG capsule, Take 1 capsule (25 mg total) by mouth 3 (three) times daily as needed., Disp: 30 capsule, Rfl: 1   amLODipine (NORVASC) 10 MG  tablet, TAKE 1 TABLET BY MOUTH EVERY DAY, Disp: 90 tablet, Rfl: 1   aspirin EC 81 MG tablet, Take 81 mg by mouth daily., Disp: , Rfl:    Candesartan Cilexetil-HCTZ 32-25 MG TABS, TAKE 1 TABLET BY MOUTH EVERY DAY, Disp: 90 tablet, Rfl: 1   cholecalciferol (VITAMIN D3) 25 MCG (1000 UNIT) tablet, Take 1,000 Units by mouth daily., Disp: , Rfl:    ipratropium (ATROVENT) 0.06 % nasal spray, Place 2 sprays into the nose 3 (three) times daily. (Patient not taking: Reported on 02/15/2020), Disp: 15 mL, Rfl: 2   loratadine (CLARITIN) 10 MG tablet, Take 10 mg by mouth daily., Disp: , Rfl:    meclizine (ANTIVERT) 12.5 MG tablet, Take 1 tablet (12.5 mg total) by mouth 3 (three) times daily as needed for dizziness., Disp: 30 tablet, Rfl: 0   Multiple Vitamins-Minerals (MULTIVITAMIN WITH MINERALS) tablet, Take 1 tablet by mouth daily., Disp: , Rfl:    prednisoLONE acetate (PRED FORTE) 1 % ophthalmic suspension, Place 1 drop into the left eye 4 (four) times daily. (Patient not taking: Reported on 02/15/2020), Disp: 5 mL, Rfl: 1   TURMERIC PO, Take 1 tablet by mouth daily., Disp: , Rfl:    vitamin C (ASCORBIC ACID) 250 MG tablet, Take 250 mg by mouth daily., Disp: , Rfl:    Vitamin D, Ergocalciferol, (DRISDOL) 1.25 MG (50000 UNIT) CAPS capsule, TAKE 1 CAPSULE (50,000 UNITS TOTAL) BY MOUTH EVERY  7 (SEVEN) DAYS., Disp: 12 capsule, Rfl: 0   Zinc 50 MG CAPS, Take 1 capsule by mouth daily., Disp: , Rfl:    Allergies  Allergen Reactions   Apple Anaphylaxis   Carrot [Daucus Carota] Anaphylaxis   Iodinated Diagnostic Agents Shortness Of Breath and Swelling    PER PATIENT BREAK THROUGH REACTION WITH PRE MEDS-2016-----Can have contrast as long as he has a 13 prep   Iohexol Shortness Of Breath and Swelling    Pt requires 13hr premeds.   Oat Anaphylaxis   Soy Allergy Anaphylaxis   Wheat Anaphylaxis   Almond Meal Itching   Cow's Milk [Lac Bovis]     Hives, and upset stomach      Review of Systems   Constitutional: Positive for fatigue. Negative for fever.  HENT: Negative.  Negative for ear pain.   Eyes: Negative.  Negative for blurred vision.  Respiratory: Negative for shortness of breath.   Cardiovascular: Negative.  Negative for leg swelling.  Gastrointestinal: Negative.   Endocrine: Negative for polydipsia, polyphagia and polyuria.  Genitourinary: Negative.   Musculoskeletal: Negative.   Skin: Negative.   Allergic/Immunologic: Negative.   Neurological: Negative for dizziness, syncope and headaches.  Hematological: Negative.   Psychiatric/Behavioral: Negative.      Today's Vitals   02/15/20 0941  BP: 132/82  Pulse: 76  Temp: (!) 97.5 F (36.4 C)  TempSrc: Oral  Weight: 213 lb 6.5 oz (96.8 kg)  Height: 5' 8.8" (1.748 m)  PainSc: 0-No pain   Body mass index is 31.7 kg/m.   Objective:  Physical Exam Vitals reviewed.  Constitutional:      General: He is not in acute distress.    Appearance: Normal appearance. He is not ill-appearing.  HENT:     Head: Normocephalic.  Eyes:     Extraocular Movements: Extraocular movements intact.     Conjunctiva/sclera: Conjunctivae normal.     Pupils: Pupils are equal, round, and reactive to light.  Cardiovascular:     Rate and Rhythm: Normal rate and regular rhythm.     Pulses: Normal pulses.     Heart sounds: Normal heart sounds. No murmur heard.   Pulmonary:     Effort: Pulmonary effort is normal.     Breath sounds: Normal breath sounds.  Musculoskeletal:     Cervical back: No muscular tenderness.  Skin:    General: Skin is warm and dry.     Capillary Refill: Capillary refill takes less than 2 seconds.  Neurological:     General: No focal deficit present.     Mental Status: He is alert.  Psychiatric:        Mood and Affect: Mood normal.        Behavior: Behavior normal.        Thought Content: Thought content normal.        Judgment: Judgment normal.         Assessment And Plan:     1. Essential  hypertension  B/P is well controlled.   CMP ordered to check renal function.  - CMP14+EGFR  2. Encounter for screening colonoscopy  cologuard order sent - Cologuard  3. Lymphadenopathy, submandibular Has mild lymphadenopathy to submandibular area May be parotid gland infection, will treat with amoxicillin  - amoxicillin (AMOXIL) 875 MG tablet; Take 1 tablet (875 mg total) by mouth 2 (two) times daily.  Dispense: 14 tablet; Refill: 0  4. Anxiety Will try hydroxyzine and encouraged to use resources to avoid caregiver burnout - hydrOXYzine (VISTARIL) 25  MG capsule; Take 1 capsule (25 mg total) by mouth 3 (three) times daily as needed.  Dispense: 30 capsule; Refill: 1  5. Abnormal glucose  Chronic, stable,   Diet and exercise controlled - Hemoglobin A1c  6. Vitamin D deficiency  Will check vitamin D level and supplement as needed.     Also encouraged to spend 15 minutes in the sun daily.  - VITAMIN D 25 Hydroxy (Vit-D Deficiency, Fractures)     Patient was given opportunity to ask questions. Patient verbalized understanding of the plan and was able to repeat key elements of the plan. All questions were answered to their satisfaction.  [name], FNP   I, [name], FNP, have reviewed all documentation for this visit. The documentation on 02/16/20 for the exam, diagnosis, procedures, and orders are all accurate and complete.   THE PATIENT IS ENCOURAGED TO PRACTICE SOCIAL DISTANCING DUE TO THE COVID-19 PANDEMIC.

## 2020-02-15 NOTE — Progress Notes (Signed)
This visit occurred during the SARS-CoV-2 public health emergency.  Safety protocols were in place, including screening questions prior to the visit, additional usage of staff PPE, and extensive cleaning of exam room while observing appropriate contact time as indicated for disinfecting solutions.   Subjective:   Aaron Snyder is a 70 y.o. male who presents for Medicare Annual/Subsequent preventive examination.  Review of Systems     Cardiac Risk Factors include: advanced age (>36men, >67 women);hypertension;male gender;obesity (BMI >30kg/m2)     Objective:    Today's Vitals   02/15/20 0925 02/15/20 0926  BP: 132/82   Pulse: 76   Temp: (!) 97.5 F (36.4 C)   TempSrc: Oral   Weight: 213 lb 6.4 oz (96.8 kg)   Height: 5' 8.8" (1.748 m)   PainSc:  8    Body mass index is 31.7 kg/m.  Advanced Directives 02/15/2020 02/09/2019 02/03/2018 12/26/2014 10/14/2013 12/15/2011  Does Patient Have a Medical Advance Directive? No No No No No Patient does not have advance directive;Patient would not like information  Would patient like information on creating a medical advance directive? No - Patient declined Yes (MAU/Ambulatory/Procedural Areas - Information given) Yes (MAU/Ambulatory/Procedural Areas - Information given) Yes - Educational materials given No - patient declined information -    Current Medications (verified) Outpatient Encounter Medications as of 02/15/2020  Medication Sig  . amLODipine (NORVASC) 10 MG tablet TAKE 1 TABLET BY MOUTH EVERY DAY  . aspirin EC 81 MG tablet Take 81 mg by mouth daily.  . Candesartan Cilexetil-HCTZ 32-25 MG TABS TAKE 1 TABLET BY MOUTH EVERY DAY  . cholecalciferol (VITAMIN D3) 25 MCG (1000 UNIT) tablet Take 1,000 Units by mouth daily.  [name] loratadine (CLARITIN) 10 MG tablet Take 10 mg by mouth daily.  . meclizine (ANTIVERT) 12.5 MG tablet Take 1 tablet (12.5 mg total) by mouth 3 (three) times daily as needed for dizziness.  . Multiple Vitamins-Minerals  (MULTIVITAMIN WITH MINERALS) tablet Take 1 tablet by mouth daily.  . TURMERIC PO Take 1 tablet by mouth daily.  . vitamin C (ASCORBIC ACID) 250 MG tablet Take 250 mg by mouth daily.  . Vitamin D, Ergocalciferol, (DRISDOL) 1.25 MG (50000 UNIT) CAPS capsule TAKE 1 CAPSULE (50,000 UNITS TOTAL) BY MOUTH EVERY 7 (SEVEN) DAYS.  [name] Zinc 50 MG CAPS Take 1 capsule by mouth daily.  [name] ipratropium (ATROVENT) 0.06 % nasal spray Place 2 sprays into the nose 3 (three) times daily. (Patient not taking: Reported on 02/15/2020)  . prednisoLONE acetate (PRED FORTE) 1 % ophthalmic suspension Place 1 drop into the left eye 4 (four) times daily. (Patient not taking: Reported on 02/15/2020)   No facility-administered encounter medications on file as of 02/15/2020.    Allergies (verified) Apple, Carrot [daucus carota], Iodinated diagnostic agents, Iohexol, Oat, Soy allergy, Wheat, Almond meal, and Cow's milk [lac bovis]   History: Past Medical History:  Diagnosis Date  . Coronary artery disease   . Dysphagia   . Head injury   . Hypertension   . Pacemaker generator end of life 12/18/2011   Medtronic  . Presence of permanent cardiac pacemaker 02/01/2003   Medtronic  . Syncope    Past Surgical History:  Procedure Laterality Date  . CARDIAC CATHETERIZATION N/A 12/26/2014   Procedure: Left Heart Cath and Coronary Angiography;  Surgeon: [name], MD;  Location: [place] [facility];  Service: Cardiovascular;  Laterality: N/A;  . ESOPHAGOGASTRODUODENOSCOPY     [place]  . LEFT AND RIGHT HEART CATHETERIZATION WITH  CORONARY ANGIOGRAM N/A 12/17/2011   Procedure: LEFT AND RIGHT HEART CATHETERIZATION WITH CORONARY ANGIOGRAM;  Surgeon: [name], MD;  Location: [facility] CATH LAB;  Service: Cardiovascular;  Laterality: N/A;  . LOOP RECORDER IMPLANT/EXPLANT  2005  . NM MYOCAR PERF WALL MOTION  01/2011   Negative  . PACEMAKER GENERATOR CHANGE  12/18/2011   Medtronic  . PACEMAKER INSERTION  2005   Medtronic  . PERMANENT  PACEMAKER GENERATOR CHANGE N/A 12/18/2011   Procedure: PERMANENT PACEMAKER GENERATOR CHANGE;  Surgeon: [name], MD;  Location: [facility] CATH LAB;  Service: Cardiovascular;  Laterality: N/A;  . TOTAL SHOULDER ARTHROPLASTY     Family History  Problem Relation Age of Onset  . Diabetes Father   . Hypertension Father    Social History   Socioeconomic History  . Marital status: Widowed    Spouse name: Not on file  . Number of children: Not on file  . Years of education: Not on file  . Highest education level: Not on file  Occupational History  . Occupation: Disabled    Employer: UNEMPLOYED  Tobacco Use  . Smoking status: Former Smoker    Packs/day: 1.00    Years: 15.00    Pack years: 15.00    Types: Cigarettes    Quit date: 03/15/1996    Years since quitting: 23.9  . Smokeless tobacco: Never Used  Vaping Use  . Vaping Use: Never used  Substance and Sexual Activity  . Alcohol use: No  . Drug use: Yes    Types: Marijuana  . Sexual activity: Not Currently  Other Topics Concern  . Not on file  Social History Narrative  . Not on file   Social Determinants of Health   Financial Resource Strain: Low Risk   . Difficulty of Paying Living Expenses: Not hard at all  Food Insecurity: No Food Insecurity  . Worried About Charity fundraiser in the Last Year: Never true  . Ran Out of Food in the Last Year: Never true  Transportation Needs: No Transportation Needs  . Lack of Transportation (Medical): No  . Lack of Transportation (Non-Medical): No  Physical Activity: Sufficiently Active  . Days of Exercise per Week: 5 days  . Minutes of Exercise per Session: 120 min  Stress: Stress Concern Present  . Feeling of Stress : Rather much  Social Connections: Not on file    Tobacco Counseling Counseling given: Not Answered   Clinical Intake:  Pre-visit preparation completed: Yes  Pain : 0-10 Pain Score: 8  Pain Type: Chronic pain Pain Location: Back Pain Orientation:  Lower Pain Descriptors / Indicators: Aching Pain Onset: More than a month ago Pain Frequency: Constant     Nutritional Status: BMI > 30  Obese Nutritional Risks: None Diabetes: No  How often do you need to have someone help you when you read instructions, pamphlets, or other written materials from your doctor or pharmacy?: 1 - Never  Diabetic? no  Interpreter Needed?: No  Information entered by :: NAllen LPN   Activities of Daily Living In your present state of health, do you have any difficulty performing the following activities: 02/15/2020  Hearing? N  Vision? Y  Comment blurry, burning and watery at times  Difficulty concentrating or making decisions? N  Walking or climbing stairs? Y  Comment due to back  Dressing or bathing? N  Doing errands, shopping? N  Preparing Food and eating ? N  Using the Toilet? N  In the past six months,  have you accidently leaked urine? N  Do you have problems with loss of bowel control? N  Managing your Medications? N  Managing your Finances? N  Some recent data might be hidden    Patient Care Team: [name], FNP as PCP - General (General Practice) Warden Fillers, MD as Consulting Physician (Ophthalmology)  Indicate any recent Medical Services you may have received from other than [facility] providers in the past year (date may be approximate).     Assessment:   This is a routine wellness examination for [place].  Hearing/Vision screen  Hearing Screening   125Hz  250Hz  500Hz  1000Hz  2000Hz  3000Hz  4000Hz  6000Hz  8000Hz   Right ear:           Left ear:           Vision Screening Comments: No regular eye exams, [facility]  Dietary issues and exercise activities discussed: Current Exercise Habits: Home exercise routine, Type of exercise: stretching;strength training/weights;Other - see comments (stationary bike), Time (Minutes): > 60, Frequency (Times/Week): 5, Weekly Exercise (Minutes/Week): 0  Goals    .  Patient Stated       Wants to be pain free     .  Patient Stated      02/09/2019, not catch covid and lose 5 pounds    .  Patient Stated      02/15/2020, wants to weigh 215 pounds and improve mobility    .  Weight (lb) < 200 lb (90.7 kg) (pt-stated)      Wants to lower BMI      Depression Screen PHQ 2/9 Scores 02/15/2020 03/13/2019 02/09/2019 08/04/2018 02/03/2018 02/03/2018  PHQ - 2 Score 0 0 1 0 4 0  PHQ- 9 Score - - 7 - 13 -    Fall Risk Fall Risk  02/15/2020 03/13/2019 02/09/2019 08/04/2018 02/03/2018  Falls in the past year? 1 0 0 0 0  Comment got dizzy - - - -  Risk for fall due to : Medication side effect - Medication side effect;Impaired balance/gait - Medication side effect  Follow up Falls evaluation completed;Education provided;Falls prevention discussed - Falls evaluation completed;Education provided;Falls prevention discussed - Falls prevention discussed    FALL RISK PREVENTION PERTAINING TO THE HOME:  Any stairs in or around the home? Yes  If so, are there any without handrails? No  Home free of loose throw rugs in walkways, pet beds, electrical cords, etc? Yes  Adequate lighting in your home to reduce risk of falls? Yes   ASSISTIVE DEVICES UTILIZED TO PREVENT FALLS:  Life alert? No  Use of a cane, walker or w/c? No  Grab bars in the bathroom? No  Shower chair or bench in shower? No  Elevated toilet seat or a handicapped toilet? No   TIMED UP AND GO:  Was the test performed? No .   Gait steady and fast without use of assistive device  Cognitive Function:     6CIT Screen 02/15/2020 02/09/2019 02/03/2018  What Year? 0 points 0 points 0 points  What month? 0 points 0 points 0 points  What time? 0 points 0 points 3 points  Count back from 20 0 points 0 points 0 points  Months in reverse 0 points 2 points 0 points  Repeat phrase 0 points 0 points 0 points  Total Score 0 2 3    Immunizations Immunization History  Administered Date(s) Administered  . Influenza, High Dose Seasonal PF  11/08/2018, 10/16/2019  . Influenza,inj,Quad PF,6+ Mos 10/16/2013  . Influenza-Unspecified  10/13/2017, 10/16/2019  . PFIZER(Purple Top)SARS-COV-2 Vaccination 03/21/2019, 04/11/2019, 10/28/2019  . Pneumococcal Conjugate-13 03/02/2018  . Pneumococcal Polysaccharide-23 06/01/2019    TDAP status: Due, Education has been provided regarding the importance of this vaccine. Advised may receive this vaccine at local pharmacy or Health Dept. Aware to provide a copy of the vaccination record if obtained from local pharmacy or Health Dept. Verbalized acceptance and understanding.  Flu Vaccine status: Up to date  Pneumococcal vaccine status: Up to date  Covid-19 vaccine status: Completed vaccines  Qualifies for Shingles Vaccine? Yes   Zostavax completed No   Shingrix Completed?: No.    Education has been provided regarding the importance of this vaccine. Patient has been advised to call insurance company to determine out of pocket expense if they have not yet received this vaccine. Advised may also receive vaccine at local pharmacy or Health Dept. Verbalized acceptance and understanding.  Screening Tests Health Maintenance  Topic Date Due  . TETANUS/TDAP  Never done  . COLONOSCOPY (Pts 45-47yrs Insurance coverage will need to be confirmed)  Never done  . INFLUENZA VACCINE  Completed  . COVID-19 Vaccine  Completed  . Hepatitis C Screening  Completed  . PNA vac Low Risk Adult  Completed    Health Maintenance  Health Maintenance Due  Topic Date Due  . TETANUS/TDAP  Never done  . COLONOSCOPY (Pts 45-9yrs Insurance coverage will need to be confirmed)  Never done    Colorectal cancer screening: Type of screening: Cologuard. Completed 2018. Repeat every 3 years  Lung Cancer Screening: (Low Dose CT Chest recommended if Age 105-80 years, 30 pack-year currently smoking OR have quit w/in 15years.) does not qualify.   Lung Cancer Screening Referral: no  Additional Screening:  Hepatitis C  Screening: does qualify; Completed 02/03/2018  Vision Screening: Recommended annual ophthalmology exams for early detection of glaucoma and other disorders of the eye. Is the patient up to date with their annual eye exam?  Yes  Who is the provider or what is the name of the office in which the patient attends annual eye exams? [facility] If pt is not established with a provider, would they like to be referred to a provider to establish care? No .   Dental Screening: Recommended annual dental exams for proper oral hygiene  Community Resource Referral / Chronic Care Management: CRR required this visit?  No   CCM required this visit?  No      Plan:     I have personally reviewed and noted the following in the patient's chart:   . Medical and social history . Use of alcohol, tobacco or illicit drugs  . Current medications and supplements . Functional ability and status . Nutritional status . Physical activity . Advanced directives . List of other physicians . Hospitalizations, surgeries, and ER visits in previous 12 months . Vitals . Screenings to include cognitive, depression, and falls . Referrals and appointments  In addition, I have reviewed and discussed with patient certain preventive protocols, quality metrics, and best practice recommendations. A written personalized care plan for preventive services as well as general preventive health recommendations were provided to patient.     Kellie Simmering, LPN   [id number]   Nurse Notes:

## 2020-03-05 DIAGNOSIS — Z1211 Encounter for screening for malignant neoplasm of colon: Secondary | ICD-10-CM | POA: Diagnosis not present

## 2020-03-05 LAB — COLOGUARD: Cologuard: NEGATIVE

## 2020-03-13 ENCOUNTER — Encounter: Payer: Self-pay | Admitting: Nurse Practitioner

## 2020-03-13 LAB — EXTERNAL GENERIC LAB PROCEDURE: COLOGUARD: NEGATIVE

## 2020-03-13 LAB — COLOGUARD: COLOGUARD: NEGATIVE

## 2020-03-14 ENCOUNTER — Telehealth: Payer: Self-pay

## 2020-03-14 NOTE — Telephone Encounter (Signed)
Patient notified that his cologuard is negative. YL,RMA

## 2020-04-08 ENCOUNTER — Ambulatory Visit (INDEPENDENT_AMBULATORY_CARE_PROVIDER_SITE_OTHER): Payer: Medicare HMO | Admitting: Nurse Practitioner

## 2020-04-08 ENCOUNTER — Encounter: Payer: Self-pay | Admitting: Nurse Practitioner

## 2020-04-08 ENCOUNTER — Other Ambulatory Visit: Payer: Self-pay

## 2020-04-08 VITALS — BP 132/70 | HR 104 | Temp 97.5°F | Ht 68.8 in | Wt 215.2 lb

## 2020-04-08 DIAGNOSIS — F419 Anxiety disorder, unspecified: Secondary | ICD-10-CM | POA: Diagnosis not present

## 2020-04-08 DIAGNOSIS — R69 Illness, unspecified: Secondary | ICD-10-CM | POA: Diagnosis not present

## 2020-04-08 DIAGNOSIS — I10 Essential (primary) hypertension: Secondary | ICD-10-CM

## 2020-04-08 MED ORDER — HYDROXYZINE PAMOATE 25 MG PO CAPS: 25.0000 mg | ORAL_CAPSULE | Freq: Three times a day (TID) | ORAL | 2 refills | Status: DC | PRN

## 2020-04-08 MED ORDER — AMLODIPINE BESYLATE 10 MG PO TABS: 10.0000 mg | ORAL_TABLET | Freq: Every day | ORAL | 1 refills | Status: DC

## 2020-04-08 MED ORDER — AMLODIPINE BESYLATE 10 MG PO TABS: 10.0000 mg | ORAL_TABLET | Freq: Every day | ORAL | 3 refills | Status: DC

## 2020-04-08 NOTE — Progress Notes (Signed)
I,Yamilka Roman [employer] as a Education administrator for [employer], FNP.,have documented all relevant documentation on the behalf of [name], FNP,as directed by  [name], FNP while in the presence of [name], [place]. This visit occurred during the SARS-CoV-2 public health emergency.  Safety protocols were in place, including screening questions prior to the visit, additional usage of staff PPE, and extensive cleaning of exam room while observing appropriate contact time as indicated for disinfecting solutions.  Subjective:     Patient ID: Aaron Snyder , male    DOB: 02-Dec-1950 , 70 y.o.   MRN: [id number]   Chief Complaint  Patient presents with  . Anxiety    HPI  Patient here for a f/u on his anxiety. He stated he is only taking the hydroxyzine as needed because it makes him go to sleep. The medication has helped him to rest and he does feel it works quickly.  He is using a little less than once a day.  He does feel like it serves its purpose.  He feels like he had a panic attack yesterday. He had been seeing [name].  He reports having an episode with his left eye was cloudy for about 20 minutes, he has an appt scheduled with the ophthalmologist in 2 months.     Anxiety Presents for follow-up visit. Symptoms include nervous/anxious behavior. Patient reports no chest pain, palpitations or shortness of breath.       Past Medical History:  Diagnosis Date  . Coronary artery disease   . Dysphagia   . Head injury   . Hypertension   . Pacemaker generator end of life 12/18/2011   Medtronic  . Presence of permanent cardiac pacemaker 02/01/2003   Medtronic  . Syncope      Family History  Problem Relation Age of Onset  . Diabetes Father   . Hypertension Father      Current Outpatient Medications:  .  aspirin EC 81 MG tablet, Take 81 mg by mouth daily., Disp: , Rfl:  .  Candesartan Cilexetil-HCTZ 32-25 MG TABS, TAKE 1 TABLET BY MOUTH EVERY DAY, Disp: 90 tablet, Rfl: 1 .   cholecalciferol (VITAMIN D3) 25 MCG (1000 UNIT) tablet, Take 1,000 Units by mouth daily., Disp: , Rfl:  .  loratadine (CLARITIN) 10 MG tablet, Take 10 mg by mouth daily., Disp: , Rfl:  .  meclizine (ANTIVERT) 12.5 MG tablet, Take 1 tablet (12.5 mg total) by mouth 3 (three) times daily as needed for dizziness., Disp: 30 tablet, Rfl: 0 .  Multiple Vitamins-Minerals (MULTIVITAMIN WITH MINERALS) tablet, Take 1 tablet by mouth daily., Disp: , Rfl:  .  TURMERIC PO, Take 1 tablet by mouth daily., Disp: , Rfl:  .  vitamin C (ASCORBIC ACID) 250 MG tablet, Take 250 mg by mouth daily., Disp: , Rfl:  .  Vitamin D, Ergocalciferol, (DRISDOL) 1.25 MG (50000 UNIT) CAPS capsule, TAKE 1 CAPSULE (50,000 UNITS TOTAL) BY MOUTH EVERY 7 (SEVEN) DAYS., Disp: 12 capsule, Rfl: 0 .  Zinc 50 MG CAPS, Take 1 capsule by mouth daily., Disp: , Rfl:  .  amLODipine (NORVASC) 10 MG tablet, Take 1 tablet (10 mg total) by mouth daily., Disp: 90 tablet, Rfl: 1 .  hydrOXYzine (VISTARIL) 25 MG capsule, Take 1 capsule (25 mg total) by mouth 3 (three) times daily as needed., Disp: 30 capsule, Rfl: 2 .  ipratropium (ATROVENT) 0.06 % nasal spray, Place 2 sprays into the nose 3 (three) times daily. (Patient not taking: No sig reported),  Disp: 15 mL, Rfl: 2 .  prednisoLONE acetate (PRED FORTE) 1 % ophthalmic suspension, Place 1 drop into the left eye 4 (four) times daily. (Patient not taking: No sig reported), Disp: 5 mL, Rfl: 1   Allergies  Allergen Reactions  . Apple Anaphylaxis  . Carrot [Daucus Carota] Anaphylaxis  . Iodinated Diagnostic Agents Shortness Of Breath and Swelling    PER PATIENT BREAK THROUGH REACTION WITH PRE MEDS-2016-----Can have contrast as long as he has a 13 prep  . Iohexol Shortness Of Breath and Swelling    Pt requires 13hr premeds.  . Oat Anaphylaxis  . Soy Allergy Anaphylaxis  . Wheat Anaphylaxis  . Almond Meal Itching  . Cow's Milk [Lac Bovis]     Hives, and upset stomach      Review of Systems   Constitutional: Negative.   Respiratory: Negative.  Negative for shortness of breath.   Cardiovascular: Negative.  Negative for chest pain, palpitations and leg swelling.  Psychiatric/Behavioral: The patient is nervous/anxious.      Today's Vitals   04/08/20 0924  BP: 132/70  Pulse: (!) 104  Temp: (!) 97.5 F (36.4 C)  TempSrc: Oral  Weight: 215 lb 3.2 oz (97.6 kg)  Height: 5' 8.8" (1.748 m)  PainSc: 0-No pain   Body mass index is 31.96 kg/m.   Objective:  Physical Exam Constitutional:      General: He is not in acute distress.    Appearance: Normal appearance. He is obese.  Cardiovascular:     Rate and Rhythm: Normal rate and regular rhythm.     Pulses: Normal pulses.     Heart sounds: Normal heart sounds.  Pulmonary:     Effort: Pulmonary effort is normal.     Breath sounds: Normal breath sounds.  Abdominal:     General: Abdomen is flat. Bowel sounds are normal.     Palpations: Abdomen is soft.  Musculoskeletal:        General: Normal range of motion.     Cervical back: Normal range of motion and neck supple.  Skin:    General: Skin is warm and dry.     Capillary Refill: Capillary refill takes less than 2 seconds.  Neurological:     General: No focal deficit present.     Mental Status: He is alert and oriented to person, place, and time.  Psychiatric:        Mood and Affect: Mood normal.        Behavior: Behavior normal.        Thought Content: Thought content normal.        Judgment: Judgment normal.         Assessment And Plan:     1. Anxiety  Hydroxyzine is working well, he is considering taking at least 3 times a day  I have also discussed with him to contact [name] for counseling - hydrOXYzine (VISTARIL) 25 MG capsule; Take 1 capsule (25 mg total) by mouth 3 (three) times daily as needed.  Dispense: 30 capsule; Refill: 2  2. Essential hypertension  Chronic, fair control  Continue with current medications, refill sent to pharmacy -  amLODipine (NORVASC) 10 MG tablet; Take 1 tablet (10 mg total) by mouth daily.  Dispense: 90 tablet; Refill: 1     Patient was given opportunity to ask questions. Patient verbalized understanding of the plan and was able to repeat key elements of the plan. All questions were answered to their satisfaction.  [name], FNP  Teola Bradley, FNP, have reviewed all documentation for this visit. The documentation on 04/08/20 for the exam, diagnosis, procedures, and orders are all accurate and complete.   IF YOU HAVE BEEN REFERRED TO A SPECIALIST, IT MAY TAKE 1-2 WEEKS TO SCHEDULE/PROCESS THE REFERRAL. IF YOU HAVE NOT HEARD FROM US/SPECIALIST IN TWO WEEKS, PLEASE GIVE [place] A CALL AT [phone].   THE PATIENT IS ENCOURAGED TO PRACTICE SOCIAL DISTANCING DUE TO THE COVID-19 PANDEMIC.

## 2020-04-18 ENCOUNTER — Ambulatory Visit (INDEPENDENT_AMBULATORY_CARE_PROVIDER_SITE_OTHER): Payer: Medicare HMO

## 2020-04-18 DIAGNOSIS — I495 Sick sinus syndrome: Secondary | ICD-10-CM

## 2020-04-19 LAB — CUP PACEART REMOTE DEVICE CHECK
Battery Impedance: 832 Ohm
Battery Remaining Longevity: 77 mo
Battery Voltage: 2.76 V
Brady Statistic AP VP Percent: 1 %
Brady Statistic AP VS Percent: 94 %
Brady Statistic AS VP Percent: 0 %
Brady Statistic AS VS Percent: 6 %
Date Time Interrogation Session: 20220324125752
Implantable Lead Implant Date: 20050106
Implantable Lead Implant Date: 20131122
Implantable Lead Location: 753859
Implantable Lead Location: 753860
Implantable Lead Model: 5092
Implantable Lead Model: 5594
Implantable Pulse Generator Implant Date: 20131122
Lead Channel Impedance Value: 442 Ohm
Lead Channel Impedance Value: 730 Ohm
Lead Channel Pacing Threshold Amplitude: 0.625 V
Lead Channel Pacing Threshold Amplitude: 0.625 V
Lead Channel Pacing Threshold Pulse Width: 0.4 ms
Lead Channel Pacing Threshold Pulse Width: 0.4 ms
Lead Channel Setting Pacing Amplitude: 1.5 V
Lead Channel Setting Pacing Amplitude: 2 V
Lead Channel Setting Pacing Pulse Width: 0.4 ms
Lead Channel Setting Sensing Sensitivity: 4 mV

## 2020-04-30 NOTE — Progress Notes (Signed)
Remote pacemaker transmission.   

## 2020-05-07 DIAGNOSIS — R5383 Other fatigue: Secondary | ICD-10-CM | POA: Diagnosis not present

## 2020-05-07 DIAGNOSIS — I129 Hypertensive chronic kidney disease with stage 1 through stage 4 chronic kidney disease, or unspecified chronic kidney disease: Secondary | ICD-10-CM | POA: Diagnosis not present

## 2020-05-07 DIAGNOSIS — N183 Chronic kidney disease, stage 3 unspecified: Secondary | ICD-10-CM | POA: Diagnosis not present

## 2020-05-07 DIAGNOSIS — D509 Iron deficiency anemia, unspecified: Secondary | ICD-10-CM | POA: Diagnosis not present

## 2020-05-07 DIAGNOSIS — Z6830 Body mass index (BMI) 30.0-30.9, adult: Secondary | ICD-10-CM | POA: Diagnosis not present

## 2020-07-18 ENCOUNTER — Ambulatory Visit (INDEPENDENT_AMBULATORY_CARE_PROVIDER_SITE_OTHER): Payer: Medicare HMO

## 2020-07-18 DIAGNOSIS — I495 Sick sinus syndrome: Secondary | ICD-10-CM

## 2020-07-18 DIAGNOSIS — H43813 Vitreous degeneration, bilateral: Secondary | ICD-10-CM | POA: Diagnosis not present

## 2020-07-18 DIAGNOSIS — H43393 Other vitreous opacities, bilateral: Secondary | ICD-10-CM | POA: Diagnosis not present

## 2020-07-19 LAB — CUP PACEART REMOTE DEVICE CHECK
Battery Impedance: 884 Ohm
Battery Remaining Longevity: 74 mo
Battery Voltage: 2.77 V
Brady Statistic AP VP Percent: 1 %
Brady Statistic AP VS Percent: 94 %
Brady Statistic AS VP Percent: 0 %
Brady Statistic AS VS Percent: 5 %
Date Time Interrogation Session: 20220623075342
Implantable Lead Implant Date: 20050106
Implantable Lead Implant Date: 20131122
Implantable Lead Location: 753859
Implantable Lead Location: 753860
Implantable Lead Model: 5092
Implantable Lead Model: 5594
Implantable Pulse Generator Implant Date: 20131122
Lead Channel Impedance Value: 456 Ohm
Lead Channel Impedance Value: 623 Ohm
Lead Channel Pacing Threshold Amplitude: 0.5 V
Lead Channel Pacing Threshold Amplitude: 0.5 V
Lead Channel Pacing Threshold Pulse Width: 0.4 ms
Lead Channel Pacing Threshold Pulse Width: 0.4 ms
Lead Channel Setting Pacing Amplitude: 1.5 V
Lead Channel Setting Pacing Amplitude: 2 V
Lead Channel Setting Pacing Pulse Width: 0.4 ms
Lead Channel Setting Sensing Sensitivity: 4 mV

## 2020-08-07 NOTE — Progress Notes (Signed)
Remote pacemaker transmission.   

## 2020-08-15 ENCOUNTER — Other Ambulatory Visit: Payer: Self-pay

## 2020-08-15 ENCOUNTER — Ambulatory Visit (INDEPENDENT_AMBULATORY_CARE_PROVIDER_SITE_OTHER): Payer: Medicare HMO | Admitting: Nurse Practitioner

## 2020-08-15 ENCOUNTER — Encounter: Payer: Self-pay | Admitting: Nurse Practitioner

## 2020-08-15 VITALS — BP 122/80 | HR 69 | Temp 98.4°F | Ht 69.4 in | Wt 211.8 lb

## 2020-08-15 DIAGNOSIS — Z79899 Other long term (current) drug therapy: Secondary | ICD-10-CM | POA: Diagnosis not present

## 2020-08-15 DIAGNOSIS — I10 Essential (primary) hypertension: Secondary | ICD-10-CM

## 2020-08-15 DIAGNOSIS — G8929 Other chronic pain: Secondary | ICD-10-CM

## 2020-08-15 DIAGNOSIS — R5382 Chronic fatigue, unspecified: Secondary | ICD-10-CM | POA: Diagnosis not present

## 2020-08-15 DIAGNOSIS — K5909 Other constipation: Secondary | ICD-10-CM

## 2020-08-15 DIAGNOSIS — M545 Low back pain, unspecified: Secondary | ICD-10-CM | POA: Diagnosis not present

## 2020-08-15 DIAGNOSIS — M47816 Spondylosis without myelopathy or radiculopathy, lumbar region: Secondary | ICD-10-CM

## 2020-08-15 DIAGNOSIS — R413 Other amnesia: Secondary | ICD-10-CM | POA: Diagnosis not present

## 2020-08-15 DIAGNOSIS — Z23 Encounter for immunization: Secondary | ICD-10-CM | POA: Diagnosis not present

## 2020-08-15 DIAGNOSIS — Z0000 Encounter for general adult medical examination without abnormal findings: Secondary | ICD-10-CM

## 2020-08-15 DIAGNOSIS — R7309 Other abnormal glucose: Secondary | ICD-10-CM | POA: Diagnosis not present

## 2020-08-15 DIAGNOSIS — R39198 Other difficulties with micturition: Secondary | ICD-10-CM

## 2020-08-15 DIAGNOSIS — E6609 Other obesity due to excess calories: Secondary | ICD-10-CM

## 2020-08-15 DIAGNOSIS — Z6830 Body mass index (BMI) 30.0-30.9, adult: Secondary | ICD-10-CM

## 2020-08-15 DIAGNOSIS — E559 Vitamin D deficiency, unspecified: Secondary | ICD-10-CM | POA: Diagnosis not present

## 2020-08-15 DIAGNOSIS — R1031 Right lower quadrant pain: Secondary | ICD-10-CM

## 2020-08-15 DIAGNOSIS — Z950 Presence of cardiac pacemaker: Secondary | ICD-10-CM

## 2020-08-15 DIAGNOSIS — H6122 Impacted cerumen, left ear: Secondary | ICD-10-CM

## 2020-08-15 MED ORDER — TETANUS-DIPHTH-ACELL PERTUSSIS 5-2.5-18.5 LF-MCG/0.5 IM SUSP: 0.5000 mL | Freq: Once | INTRAMUSCULAR | 0 refills | Status: AC

## 2020-08-15 MED ORDER — CANDESARTAN CILEXETIL-HCTZ 32-25 MG PO TABS: 1.0000 | ORAL_TABLET | Freq: Every day | ORAL | 1 refills | Status: DC

## 2020-08-15 MED ORDER — AMLODIPINE BESYLATE 10 MG PO TABS: 10.0000 mg | ORAL_TABLET | Freq: Every day | ORAL | 1 refills | Status: DC

## 2020-08-15 NOTE — Progress Notes (Signed)
I,Yamilka Roman [employer] as a Education administrator for [employer], FNP.,have documented all relevant documentation on the behalf of [name], FNP,as directed by  [name], FNP while in the presence of [name], [place].  This visit occurred during the SARS-CoV-2 public health emergency.  Safety protocols were in place, including screening questions prior to the visit, additional usage of staff PPE, and extensive cleaning of exam room while observing appropriate contact time as indicated for disinfecting solutions.  Subjective:     Patient ID: Aaron Snyder , male    DOB: 21-Aug-1950 , 70 y.o.   MRN: [id number]   Chief Complaint  Patient presents with   Annual Exam    HPI  Patient here for hm.  Overall he is doing well. He has seen his Retina Specialist - thought he had a torn retina, having a blind spot in his left eye. He has an appt with his Ophthalmologist in August.   Reports he has been on Nuvigil in the past with a pain provider - 2007 - from a pain provider.   Wt Readings from Last 3 Encounters: 08/15/20 : 211 lb 12.8 oz (96.1 kg) 04/08/20 : 215 lb 3.2 oz (97.6 kg) 02/15/20 : 213 lb 6.5 oz (96.8 kg)      Past Medical History:  Diagnosis Date   Coronary artery disease    Dysphagia    Head injury    Hypertension    Pacemaker generator end of life 12/18/2011   [employer]   Presence of permanent cardiac pacemaker 02/01/2003   [employer]   Syncope      Family History  Problem Relation Age of Onset   Diabetes Father    Hypertension Father      Current Outpatient Medications:    aspirin EC 81 MG tablet, Take 81 mg by mouth daily., Disp: , Rfl:    cholecalciferol (VITAMIN D3) 25 MCG (1000 UNIT) tablet, Take 1,000 Units by mouth daily., Disp: , Rfl:    hydrOXYzine (VISTARIL) 25 MG capsule, Take 1 capsule (25 mg total) by mouth 3 (three) times daily as needed., Disp: 30 capsule, Rfl: 2   loratadine (CLARITIN) 10 MG tablet, Take 10 mg by mouth daily., Disp: , Rfl:     meclizine (ANTIVERT) 12.5 MG tablet, Take 1 tablet (12.5 mg total) by mouth 3 (three) times daily as needed for dizziness., Disp: 30 tablet, Rfl: 0   Multiple Vitamins-Minerals (MULTIVITAMIN WITH MINERALS) tablet, Take 1 tablet by mouth daily., Disp: , Rfl:    TURMERIC PO, Take 1 tablet by mouth daily., Disp: , Rfl:    vitamin C (ASCORBIC ACID) 250 MG tablet, Take 250 mg by mouth daily., Disp: , Rfl:    Vitamin D, Ergocalciferol, (DRISDOL) 1.25 MG (50000 UNIT) CAPS capsule, TAKE 1 CAPSULE (50,000 UNITS TOTAL) BY MOUTH EVERY 7 (SEVEN) DAYS., Disp: 12 capsule, Rfl: 0   Zinc 50 MG CAPS, Take 1 capsule by mouth daily., Disp: , Rfl:    amLODipine (NORVASC) 10 MG tablet, Take 1 tablet (10 mg total) by mouth daily., Disp: 90 tablet, Rfl: 1   Candesartan Cilexetil-HCTZ 32-25 MG TABS, Take 1 tablet by mouth daily., Disp: 90 tablet, Rfl: 1   ipratropium (ATROVENT) 0.06 % nasal spray, Place 2 sprays into the nose 3 (three) times daily. (Patient not taking: No sig reported), Disp: 15 mL, Rfl: 2   prednisoLONE acetate (PRED FORTE) 1 % ophthalmic suspension, Place 1 drop into the left eye 4 (four) times daily. (Patient not taking: No sig  reported), Disp: 5 mL, Rfl: 1   Allergies  Allergen Reactions   Apple Anaphylaxis   Carrot [Daucus Carota] Anaphylaxis   Iodinated Diagnostic Agents Shortness Of Breath and Swelling    PER PATIENT BREAK THROUGH REACTION WITH PRE MEDS-2016-----Can have contrast as long as he has a 13 prep   Iohexol Shortness Of Breath and Swelling    Pt requires 13hr premeds.   Oat Anaphylaxis   Soy Allergy Anaphylaxis   Wheat Anaphylaxis   Almond Meal Itching   Cow's Milk [Lac Bovis]     Hives, and upset stomach      Men's preventive visit. Patient Health Questionnaire (PHQ-2) is  Flowsheet Row Clinical Support from 02/15/2020 in [facility]  PHQ-2 Total Score 0     Patient is on a regular diet, cutting out carbs and eating more salads. His first goal was to  lose the weight he put with covid.  Exercising - with walking daily but not as much due to back pain. He will start to bend over when he gets to a certain position.    Marital status: Widowed. Relevant history for alcohol use is:  Social History   Substance and Sexual Activity  Alcohol Use No   Relevant history for tobacco use is:  Social History   Tobacco Use  Smoking Status Former   Packs/day: 1.00   Years: 15.00   Pack years: 15.00   Types: Cigarettes   Quit date: 03/15/1996   Years since quitting: 24.4  Smokeless Tobacco Never  .   Review of Systems  Constitutional: Negative.   HENT: Negative.    Eyes: Negative.   Respiratory: Negative.    Cardiovascular: Negative.  Negative for chest pain, palpitations and leg swelling.  Gastrointestinal: Negative.   Endocrine: Negative.   Genitourinary: Negative.   Musculoskeletal:  Positive for back pain (had seen [facility] - from 2011-2017. He is interested in going to a Neurosurgeon.).  Skin: Negative.   Neurological: Negative.   Hematological: Negative.   Psychiatric/Behavioral: Negative.      Today's Vitals   08/15/20 0842  BP: 122/80  Pulse: 69  Temp: 98.4 F (36.9 C)  Weight: 211 lb 12.8 oz (96.1 kg)  Height: 5' 9.4" (1.763 m)  PainSc: 9   PainLoc: Back   Body mass index is 30.92 kg/m.   Objective:  Physical Exam Vitals reviewed.  Constitutional:      General: He is not in acute distress.    Appearance: Normal appearance. He is not ill-appearing.  HENT:     Head: Normocephalic and atraumatic.     Right Ear: Tympanic membrane, ear canal and external ear normal. There is no impacted cerumen.     Left Ear: Tympanic membrane, ear canal and external ear normal. There is no impacted cerumen.     Nose:     Comments: Deferred - masked    Mouth/Throat:     Comments: Deferred - masked Eyes:     Extraocular Movements: Extraocular movements intact.     Conjunctiva/sclera: Conjunctivae normal.     Pupils:  Pupils are equal, round, and reactive to light.  Cardiovascular:     Rate and Rhythm: Normal rate and regular rhythm.     Pulses: Normal pulses.     Heart sounds: Normal heart sounds.  Pulmonary:     Effort: Pulmonary effort is normal.     Breath sounds: Normal breath sounds.  Abdominal:     General: Abdomen is flat. Bowel sounds are  normal. There is no distension.     Palpations: Abdomen is soft.     Tenderness: There is no abdominal tenderness.  Musculoskeletal:        General: Normal range of motion.     Cervical back: Normal range of motion and neck supple. No muscular tenderness.  Skin:    General: Skin is warm and dry.     Capillary Refill: Capillary refill takes less than 2 seconds.  Neurological:     General: No focal deficit present.     Mental Status: He is alert and oriented to person, place, and time.     Cranial Nerves: No cranial nerve deficit.     Motor: No weakness.  Psychiatric:        Mood and Affect: Mood normal.        Behavior: Behavior normal.        Thought Content: Thought content normal.        Judgment: Judgment normal.        Assessment And Plan:    1. Encounter for general adult medical examination w/o abnormal findings Behavior modifications discussed and diet history reviewed.   Pt will continue to exercise regularly and modify diet with low GI, plant based foods and decrease intake of processed foods.  Recommend intake of daily multivitamin, Vitamin D, and calcium.  Recommend colonoscopy for preventive screenings, as well as recommend immunizations that include influenza, TDAP, and Shingles  2. Encounter for immunization Rx sent to pharmacy. TDAP will be administered to adults 24-48 years old every 10 years. - Tdap (BOOSTRIX) 5-2.5-18.5 LF-MCG/0.5 injection; Inject 0.5 mLs into the muscle once for 1 dose.  Dispense: 0.5 mL; Refill: 0  3. Class 1 obesity due to excess calories with body mass index (BMI) of 30.0 to 30.9 in adult, unspecified  whether serious comorbidity present Chronic Discussed healthy diet and regular exercise options  Encouraged to exercise at least 150 minutes per week with 2 days of strength training as tolerated  4. Essential hypertension Comments: Well controlled, continue current medications - EKG 12-Lead - CMP14+EGFR - Candesartan Cilexetil-HCTZ 32-25 MG TABS; Take 1 tablet by mouth daily.  Dispense: 90 tablet; Refill: 1 - amLODipine (NORVASC) 10 MG tablet; Take 1 tablet (10 mg total) by mouth daily.  Dispense: 90 tablet; Refill: 1  5. Abnormal glucose Comments: Stable, no current medications - Hemoglobin A1c  6. Vitamin D deficiency Will check vitamin D level and supplement as needed.    Also encouraged to spend 15 minutes in the sun daily.  - Vitamin D (25 hydroxy)  7. Chronic bilateral low back pain without sciatica Comments: Will refer to neurosurgery due to having worsening back pain  8. Decreased urine stream Will check PSA - PSA  9. Other long term (current) drug therapy - CBC no Diff  10. Spondylosis of lumbar spine Previous history and was recommended to see Neurosurgery - Ambulatory referral to Neurosurgery  11. Chronic fatigue Comments: Has seen Cardiology no cause Will check metabolic cause, this has been ongoing  - TSH - T4 - T3, free - Iron, TIBC and Ferritin Panel - Vitamin B12  12. Other constipation Comments: Increase fiber intake and water intake may need to consider Linzess  13. Right lower quadrant abdominal pain Comments: Will check for possible hernia  14. Impacted cerumen of left ear Comments: Water lavage done with good results  15. Pacemaker Comments: Continue follow up with Cardiology   Pending results will send back to neurology for evaluation  of chronic fatigue.   Patient was given opportunity to ask questions. Patient verbalized understanding of the plan and was able to repeat key elements of the plan. All questions were answered to their  satisfaction.   [name], FNP   I, [name], FNP, have reviewed all documentation for this visit. The documentation on 09/05/20 for the exam, diagnosis, procedures, and orders are all accurate and complete.  THE PATIENT IS ENCOURAGED TO PRACTICE SOCIAL DISTANCING DUE TO THE COVID-19 PANDEMIC.

## 2020-08-15 NOTE — Patient Instructions (Signed)
Health Maintenance, Male Adopting a healthy lifestyle and getting preventive care are important in promoting health and wellness. Ask your health care provider about: The right schedule for you to have regular tests and exams. Things you can do on your own to prevent diseases and keep yourself healthy. What should I know about diet, weight, and exercise? Eat a healthy diet  Eat a diet that includes plenty of vegetables, fruits, low-fat dairy products, and lean protein. Do not eat a lot of foods that are high in solid fats, added sugars, or sodium.  Maintain a healthy weight Body mass index (BMI) is a measurement that can be used to identify possible weight problems. It estimates body fat based on height and weight. Your health care provider can help determine your BMI and help you achieve or maintain ahealthy weight. Get regular exercise Get regular exercise. This is one of the most important things you can do for your health. Most adults should: Exercise for at least 150 minutes each week. The exercise should increase your heart rate and make you sweat (moderate-intensity exercise). Do strengthening exercises at least twice a week. This is in addition to the moderate-intensity exercise. Spend less time sitting. Even light physical activity can be beneficial. Watch cholesterol and blood lipids Have your blood tested for lipids and cholesterol at 71 years of age, then havethis test every 5 years. You may need to have your cholesterol levels checked more often if: Your lipid or cholesterol levels are high. You are older than 71 years of age. You are at high risk for heart disease. What should I know about cancer screening? Many types of cancers can be detected early and may often be prevented. Depending on your health history and family history, you may need to have cancer screening at various ages. This may include screening for: Colorectal cancer. Prostate cancer. Skin cancer. Lung  cancer. What should I know about heart disease, diabetes, and high blood pressure? Blood pressure and heart disease High blood pressure causes heart disease and increases the risk of stroke. This is more likely to develop in people who have high blood pressure readings, are of African descent, or are overweight. Talk with your health care provider about your target blood pressure readings. Have your blood pressure checked: Every 3-5 years if you are 18-39 years of age. Every year if you are 40 years old or older. If you are between the ages of 65 and 75 and are a current or former smoker, ask your health care provider if you should have a one-time screening for abdominal aortic aneurysm (AAA). Diabetes Have regular diabetes screenings. This checks your fasting blood sugar level. Have the screening done: Once every three years after age 45 if you are at a normal weight and have a low risk for diabetes. More often and at a younger age if you are overweight or have a high risk for diabetes. What should I know about preventing infection? Hepatitis B If you have a higher risk for hepatitis B, you should be screened for this virus. Talk with your health care provider to find out if you are at risk forhepatitis B infection. Hepatitis C Blood testing is recommended for: Everyone born from 1945 through 1965. Anyone with known risk factors for hepatitis C. Sexually transmitted infections (STIs) You should be screened each year for STIs, including gonorrhea and chlamydia, if: You are sexually active and are younger than 71 years of age. You are older than 71 years of age   and your health care provider tells you that you are at risk for this type of infection. Your sexual activity has changed since you were last screened, and you are at increased risk for chlamydia or gonorrhea. Ask your health care provider if you are at risk. Ask your health care provider about whether you are at high risk for HIV.  Your health care provider may recommend a prescription medicine to help prevent HIV infection. If you choose to take medicine to prevent HIV, you should first get tested for HIV. You should then be tested every 3 months for as long as you are taking the medicine. Follow these instructions at home: Lifestyle Do not use any products that contain nicotine or tobacco, such as cigarettes, e-cigarettes, and chewing tobacco. If you need help quitting, ask your health care provider. Do not use street drugs. Do not share needles. Ask your health care provider for help if you need support or information about quitting drugs. Alcohol use Do not drink alcohol if your health care provider tells you not to drink. If you drink alcohol: Limit how much you have to 0-2 drinks a day. Be aware of how much alcohol is in your drink. In the [place] one drink equals one 12 oz bottle of beer (355 mL), one 5 oz glass of wine (148 mL), or one 1 oz glass of hard liquor (44 mL). General instructions Schedule regular health, dental, and eye exams. Stay current with your vaccines. Tell your health care provider if: You often feel depressed. You have ever been abused or do not feel safe at home. Summary Adopting a healthy lifestyle and getting preventive care are important in promoting health and wellness. Follow your health care provider's instructions about healthy diet, exercising, and getting tested or screened for diseases. Follow your health care provider's instructions on monitoring your cholesterol and blood pressure. This information is not intended to replace advice given to you by your health care provider. Make sure you discuss any questions you have with your healthcare provider. Document Revised: 01/05/2018 Document Reviewed: 01/05/2018 Elsevier Patient Education  2022 Elsevier Inc.  

## 2020-08-23 LAB — CBC
Hematocrit: 45.5 % (ref 37.5–51.0)
Hemoglobin: 15.3 g/dL (ref 13.0–17.7)
MCH: 29.7 pg (ref 26.6–33.0)
MCHC: 33.6 g/dL (ref 31.5–35.7)
MCV: 88 fL (ref 79–97)
Platelets: 190 10*3/uL (ref 150–450)
RBC: 5.15 x10E6/uL (ref 4.14–5.80)
RDW: 13.7 % (ref 11.6–15.4)
WBC: 5 10*3/uL (ref 3.4–10.8)

## 2020-08-23 LAB — VITAMIN B12: Vitamin B-12: 578 pg/mL (ref 232–1245)

## 2020-08-23 LAB — IRON,TIBC AND FERRITIN PANEL
Ferritin: 332 ng/mL (ref 30–400)
Iron Saturation: 34 % (ref 15–55)
Iron: 91 ug/dL (ref 38–169)
Total Iron Binding Capacity: 269 ug/dL (ref 250–450)
UIBC: 178 ug/dL (ref 111–343)

## 2020-08-23 LAB — CMP14+EGFR
ALT: 18 IU/L (ref 0–44)
AST: 30 IU/L (ref 0–40)
Albumin/Globulin Ratio: 2.1 (ref 1.2–2.2)
Albumin: 4.6 g/dL (ref 3.7–4.7)
Alkaline Phosphatase: 86 IU/L (ref 44–121)
BUN/Creatinine Ratio: 10 (ref 10–24)
BUN: 15 mg/dL (ref 8–27)
Bilirubin Total: 0.5 mg/dL (ref 0.0–1.2)
CO2: 23 mmol/L (ref 20–29)
Calcium: 9.7 mg/dL (ref 8.6–10.2)
Chloride: 101 mmol/L (ref 96–106)
Creatinine, Ser: 1.47 mg/dL — ABNORMAL HIGH (ref 0.76–1.27)
Globulin, Total: 2.2 g/dL (ref 1.5–4.5)
Glucose: 100 mg/dL — ABNORMAL HIGH (ref 65–99)
Potassium: 4.2 mmol/L (ref 3.5–5.2)
Sodium: 143 mmol/L (ref 134–144)
Total Protein: 6.8 g/dL (ref 6.0–8.5)
eGFR: 51 mL/min/{1.73_m2} — ABNORMAL LOW (ref >59)

## 2020-08-23 LAB — T4: T4, Total: 6.6 ug/dL (ref 4.5–12.0)

## 2020-08-23 LAB — HEMOGLOBIN A1C
Est. average glucose Bld gHb Est-mCnc: 120 mg/dL
Hgb A1c MFr Bld: 5.8 % — ABNORMAL HIGH (ref 4.8–5.6)

## 2020-08-23 LAB — VITAMIN D 25 HYDROXY (VIT D DEFICIENCY, FRACTURES): Vit D, 25-Hydroxy: 79.4 ng/mL (ref 30.0–100.0)

## 2020-08-23 LAB — PSA: Prostate Specific Ag, Serum: 3.2 ng/mL (ref 0.0–4.0)

## 2020-08-23 LAB — TSH: TSH: 0.89 u[IU]/mL (ref 0.450–4.500)

## 2020-08-23 LAB — T3, FREE: T3, Free: 3.8 pg/mL (ref 2.0–4.4)

## 2020-08-27 ENCOUNTER — Other Ambulatory Visit: Payer: Self-pay | Admitting: Nurse Practitioner

## 2020-08-27 DIAGNOSIS — R1031 Right lower quadrant pain: Secondary | ICD-10-CM

## 2020-08-29 ENCOUNTER — Other Ambulatory Visit: Payer: Medicare HMO

## 2020-08-29 ENCOUNTER — Ambulatory Visit
Admission: RE | Admit: 2020-08-29 | Discharge: 2020-08-29 | Disposition: A | Discharge status: Home / Self Care | Payer: Medicare HMO | Source: Ambulatory Visit | Attending: Nurse Practitioner | Admitting: Nurse Practitioner

## 2020-08-29 DIAGNOSIS — R1031 Right lower quadrant pain: Secondary | ICD-10-CM | POA: Diagnosis not present

## 2020-09-04 ENCOUNTER — Other Ambulatory Visit: Payer: Self-pay | Admitting: Nurse Practitioner

## 2020-09-04 ENCOUNTER — Other Ambulatory Visit: Payer: Medicare HMO

## 2020-09-04 ENCOUNTER — Other Ambulatory Visit: Payer: Self-pay

## 2020-09-04 DIAGNOSIS — N289 Disorder of kidney and ureter, unspecified: Secondary | ICD-10-CM

## 2020-09-04 DIAGNOSIS — N1831 Chronic kidney disease, stage 3a: Secondary | ICD-10-CM

## 2020-09-04 NOTE — Addendum Note (Signed)
Addended by: [name] F on: 09/04/2020 08:57 AM   Modules accepted: Orders

## 2020-09-05 LAB — PTH, INTACT AND CALCIUM
Calcium: 9.3 mg/dL (ref 8.6–10.2)
PTH: 61 pg/mL (ref 15–65)

## 2020-09-07 LAB — MULTIPLE MYELOMA PANEL, SERUM
Albumin SerPl Elph-Mcnc: 3.9 g/dL (ref 2.9–4.4)
Albumin/Glob SerPl: 1.6 (ref 0.7–1.7)
Alpha 1: 0.2 g/dL (ref 0.0–0.4)
Alpha2 Glob SerPl Elph-Mcnc: 0.7 g/dL (ref 0.4–1.0)
B-Globulin SerPl Elph-Mcnc: 0.9 g/dL (ref 0.7–1.3)
Gamma Glob SerPl Elph-Mcnc: 0.9 g/dL (ref 0.4–1.8)
Globulin, Total: 2.6 g/dL (ref 2.2–3.9)
IgA/Immunoglobulin A, Serum: 128 mg/dL (ref 61–437)
IgG (Immunoglobin G), Serum: 940 mg/dL (ref 603–1613)
IgM (Immunoglobulin M), Srm: 55 mg/dL (ref 15–143)
Total Protein: 6.5 g/dL (ref 6.0–8.5)

## 2020-10-15 DIAGNOSIS — G9519 Other vascular myelopathies: Secondary | ICD-10-CM | POA: Diagnosis not present

## 2020-10-15 DIAGNOSIS — Z6830 Body mass index (BMI) 30.0-30.9, adult: Secondary | ICD-10-CM | POA: Diagnosis not present

## 2020-10-17 ENCOUNTER — Ambulatory Visit (INDEPENDENT_AMBULATORY_CARE_PROVIDER_SITE_OTHER): Payer: Medicare HMO

## 2020-10-17 DIAGNOSIS — I495 Sick sinus syndrome: Secondary | ICD-10-CM

## 2020-10-17 LAB — CUP PACEART REMOTE DEVICE CHECK
Battery Impedance: 1017 Ohm
Battery Remaining Longevity: 68 mo
Battery Voltage: 2.76 V
Brady Statistic AP VP Percent: 1 %
Brady Statistic AP VS Percent: 94 %
Brady Statistic AS VP Percent: 0 %
Brady Statistic AS VS Percent: 5 %
Date Time Interrogation Session: 20220922092824
Implantable Lead Implant Date: 20050106
Implantable Lead Implant Date: 20131122
Implantable Lead Location: 753859
Implantable Lead Location: 753860
Implantable Lead Model: 5092
Implantable Lead Model: 5594
Implantable Pulse Generator Implant Date: 20131122
Lead Channel Impedance Value: 425 Ohm
Lead Channel Impedance Value: 570 Ohm
Lead Channel Pacing Threshold Amplitude: 0.5 V
Lead Channel Pacing Threshold Amplitude: 0.625 V
Lead Channel Pacing Threshold Pulse Width: 0.4 ms
Lead Channel Pacing Threshold Pulse Width: 0.4 ms
Lead Channel Setting Pacing Amplitude: 1.5 V
Lead Channel Setting Pacing Amplitude: 2 V
Lead Channel Setting Pacing Pulse Width: 0.4 ms
Lead Channel Setting Sensing Sensitivity: 2.8 mV

## 2020-10-23 ENCOUNTER — Other Ambulatory Visit: Payer: Self-pay | Admitting: Neurosurgery

## 2020-10-23 DIAGNOSIS — G9519 Other vascular myelopathies: Secondary | ICD-10-CM

## 2020-10-24 NOTE — Progress Notes (Signed)
Remote pacemaker transmission.   

## 2020-11-09 ENCOUNTER — Ambulatory Visit
Admission: RE | Admit: 2020-11-09 | Discharge: 2020-11-09 | Disposition: A | Discharge status: Home / Self Care | Payer: Medicare HMO | Source: Ambulatory Visit | Attending: Neurosurgery | Admitting: Neurosurgery

## 2020-11-09 ENCOUNTER — Other Ambulatory Visit: Payer: Self-pay

## 2020-11-09 DIAGNOSIS — M47815 Spondylosis without myelopathy or radiculopathy, thoracolumbar region: Secondary | ICD-10-CM | POA: Diagnosis not present

## 2020-11-09 DIAGNOSIS — M48061 Spinal stenosis, lumbar region without neurogenic claudication: Secondary | ICD-10-CM | POA: Diagnosis not present

## 2020-11-09 DIAGNOSIS — G9519 Other vascular myelopathies: Secondary | ICD-10-CM

## 2020-11-09 DIAGNOSIS — M47816 Spondylosis without myelopathy or radiculopathy, lumbar region: Secondary | ICD-10-CM | POA: Diagnosis not present

## 2020-11-09 DIAGNOSIS — M5126 Other intervertebral disc displacement, lumbar region: Secondary | ICD-10-CM | POA: Diagnosis not present

## 2020-11-19 ENCOUNTER — Other Ambulatory Visit: Payer: Self-pay

## 2020-11-19 ENCOUNTER — Encounter: Payer: Self-pay | Admitting: Nurse Practitioner

## 2020-11-19 ENCOUNTER — Ambulatory Visit (INDEPENDENT_AMBULATORY_CARE_PROVIDER_SITE_OTHER): Payer: Medicare HMO | Admitting: Nurse Practitioner

## 2020-11-19 VITALS — BP 110/80 | HR 72 | Temp 97.9°F | Ht 69.0 in | Wt 210.6 lb

## 2020-11-19 DIAGNOSIS — R7309 Other abnormal glucose: Secondary | ICD-10-CM | POA: Diagnosis not present

## 2020-11-19 DIAGNOSIS — M545 Low back pain, unspecified: Secondary | ICD-10-CM | POA: Diagnosis not present

## 2020-11-19 DIAGNOSIS — I129 Hypertensive chronic kidney disease with stage 1 through stage 4 chronic kidney disease, or unspecified chronic kidney disease: Secondary | ICD-10-CM

## 2020-11-19 DIAGNOSIS — N183 Chronic kidney disease, stage 3 unspecified: Secondary | ICD-10-CM

## 2020-11-19 DIAGNOSIS — G8929 Other chronic pain: Secondary | ICD-10-CM | POA: Diagnosis not present

## 2020-11-19 NOTE — Patient Instructions (Signed)
Chronic Kidney Disease, Adult Chronic kidney disease (CKD) occurs when the kidneys are slowly and permanently damaged over a long period of time. The kidneys are a pair of organs that do many important jobs in the body, including: Removing waste and extra fluid from the blood to make urine. Making hormones that maintain the amount of fluid in tissues and blood vessels. Maintaining the right amount of fluids and chemicals in the body. A small amount of kidney damage may not cause problems, but a large amount of damage may make it hard or impossible for the kidneys to work right. Steps must be taken to slow kidney damage or to stop it from getting worse. If steps are not taken, the kidneys may stop working permanently (end-stage renal disease, or ESRD). Most of the time, CKD does not go away, but it can often becontrolled. People who have CKD are usually able to live full lives. What are the causes? The most common causes of this condition are diabetes and high blood pressure (hypertension). Other causes include: Cardiovascular diseases. These affect the heart and blood vessels. Kidney diseases. These include: Glomerulonephritis, or inflammation of the tiny filters in the kidneys. Interstitial nephritis. This is swelling of the small tubes of the kidneys and of the surrounding structures. Polycystic kidney disease, in which clusters of fluid-filled sacs form within the kidneys. Renal vascular disease. This includes disorders that affect the arteries and veins of the kidneys. Diseases that affect the body's defense system (immune system). A problem with urine flow. This may be caused by: Kidney stones. Cancer. An enlarged prostate, in males. A kidney infection or urinary tract infection (UTI) that keeps coming back. Vasculitis. This is swelling or inflammation of the blood vessels. What increases the risk? Your chances of having kidney disease increase with age. The following factors may make  you more likely to develop this condition: A family history of kidney disease or kidney failure. Kidney failure means the kidneys can no longer work right. Certain genetic diseases. Taking medicines often that are damaging to the kidneys. Being around or being in contact with toxic substances. Obesity. A history of tobacco use. What are the signs or symptoms? Symptoms of this condition include: Feeling very tired (lethargic) and having less energy. Swelling, or edema, of the face, legs, ankles, or feet. Nausea or vomiting, or loss of appetite. Confusion or trouble concentrating. Muscle twitches and cramps, especially in the legs. Dry, itchy skin. A metallic taste in the mouth. Producing less urine, or producing more urine (especially at night). Shortness of breath. Trouble sleeping. CKD may also result in not having enough red blood cells or hemoglobin in the blood (anemia) or having weak bones (bone disease). Symptoms develop slowly and may not be obvious until the kidney damage becomessevere. It is possible to have kidney disease for years without having symptoms. How is this diagnosed? This condition may be diagnosed based on: Blood tests. Urine tests. Imaging tests, such as an ultrasound or a CT scan. A kidney biopsy. This involves removing a sample of kidney tissue to be looked at under a microscope. Results from these tests will help to determine how serious the CKD is. How is this treated? There is no cure for most cases of this condition, but treatment usually relieves symptoms and prevents or slows the worsening of the disease. Treatment may include: Diet changes, which may require you to avoid alcohol and foods that are high in salt, potassium, phosphorous, and protein. Medicines. These may: Lower blood   pressure. Control blood sugar (glucose). Relieve anemia. Relieve swelling. Protect your bones. Improve the balance of salts and minerals in your blood  (electrolytes). Dialysis, which is a type of treatment that removes toxic waste from the body. It may be needed if you have kidney failure. Managing any other conditions that are causing your CKD or making it worse. Follow these instructions at home: Medicines Take over-the-counter and prescription medicines only as told by your health care provider. The amount of some medicines that you take may need to be changed. Do not take any new medicines unless approved by your health care provider. Many medicines can make kidney damage worse. Do not take any vitamin and mineral supplements unless approved by your health care provider. Many nutritional supplements can make kidney damage worse. Lifestyle  Do not use any products that contain nicotine or tobacco, such as cigarettes, e-cigarettes, and chewing tobacco. If you need help quitting, ask your health care provider. If you drink alcohol: Limit how much you use to: 0-1 drink a day for women who are not pregnant. 0-2 drinks a day for men. Know how much alcohol is in your drink. In the [place] one drink equals one 12 oz bottle of beer (355 mL), one 5 oz glass of wine (148 mL), or one 1 oz glass of hard liquor (44 mL). Maintain a healthy weight. If you need help, ask your health care provider.  General instructions  Follow instructions from your health care provider about eating or drinking restrictions, including any prescribed diet. Track your blood pressure at home. Report changes in your blood pressure as told. If you are being treated for diabetes, track your blood glucose levels as told. Start or continue an exercise plan. Exercise at least 30 minutes a day, 5 days a week. Keep your immunizations up to date as told. Keep all follow-up visits. This is important.  Where to find more information American Association of Kidney Patients: www.aakp.org National Kidney Foundation: www.kidney.org American Kidney Fund: www.akfinc.org Life Options:  www.lifeoptions.org Kidney School: www.kidneyschool.org Contact a health care provider if: Your symptoms get worse. You develop new symptoms. Get help right away if: You develop symptoms of ESRD. These include: Headaches. Numbness in your hands or feet. Easy bruising. Frequent hiccups. Chest pain. Shortness of breath. Lack of menstrual periods, in women. You have a fever. You are producing less urine than usual. You have pain or bleeding when you urinate or when you have a bowel movement. These symptoms may represent a serious problem that is an emergency. Do not wait to see if the symptoms will go away. Get medical help right away. Call your local emergency services (911 in the [place] Do not drive yourself to the hospital. Summary Chronic kidney disease (CKD) occurs when the kidneys become damaged slowly over a long period of time. The most common causes of this condition are diabetes and high blood pressure (hypertension). There is no cure for most cases of CKD, but treatment usually relieves symptoms and prevents or slows the worsening of the disease. Treatment may include a combination of lifestyle changes, medicines, and dialysis. This information is not intended to replace advice given to you by your health care provider. Make sure you discuss any questions you have with your healthcare provider. Document Revised: 04/19/2019 Document Reviewed: 04/19/2019 Elsevier Patient Education  2022 Elsevier Inc.  

## 2020-11-19 NOTE — Progress Notes (Signed)
I, Azalee Course, acting as a Education administrator for [employer], FNP.,have documented all relevant documentation on the behalf of [name], FNP,as directed by  [name], FNP while in the presence of [name], [place].   This visit occurred during the SARS-CoV-2 public health emergency.  Safety protocols were in place, including screening questions prior to the visit, additional usage of staff PPE, and extensive cleaning of exam room while observing appropriate contact time as indicated for disinfecting solutions.  Subjective:     Patient ID: Aaron Snyder , male    DOB: [birth year] , 70 y.o.   MRN: [id number]   Chief Complaint  Patient presents with   Diabetes     HPI  Pt here for DM/HTN f/u.  He has seen Dr. Christella Noa for a CT scan of his back and he is to follow up.  He continues to see Dr Lyda Kalata (Nephrology).  His eGFR in 2015 was 58 and eGFR was 1.43.  He reports seeing a Nephrologist since 2015.    Diabetes He presents for his follow-up diabetic visit. Pertinent negatives for hypoglycemia include no headaches. Pertinent negatives for diabetes include no blurred vision. There are no hypoglycemic complications. There are no diabetic complications. Current diabetic treatment includes diet.  Hypertension This is a chronic problem. The current episode started more than 1 year ago. The problem is unchanged. The problem is controlled. Associated symptoms include anxiety. Pertinent negatives include no blurred vision, headaches or shortness of breath. Risk factors for coronary artery disease include obesity and sedentary lifestyle. There is no history of angina.    Past Medical History:  Diagnosis Date   Coronary artery disease    Dysphagia    Head injury    Hypertension    Pacemaker generator end of life 12/18/2011   [employer]   Presence of permanent cardiac pacemaker 02/01/2003   [employer]   Syncope      Family History  Problem Relation Age of Onset   Diabetes Father     Hypertension Father      Current Outpatient Medications:    amLODipine (NORVASC) 10 MG tablet, Take 1 tablet (10 mg total) by mouth daily., Disp: 90 tablet, Rfl: 1   aspirin EC 81 MG tablet, Take 81 mg by mouth daily., Disp: , Rfl:    Candesartan Cilexetil-HCTZ 32-25 MG TABS, Take 1 tablet by mouth daily., Disp: 90 tablet, Rfl: 1   cholecalciferol (VITAMIN D3) 25 MCG (1000 UNIT) tablet, Take 1,000 Units by mouth daily., Disp: , Rfl:    loratadine (CLARITIN) 10 MG tablet, Take 10 mg by mouth daily., Disp: , Rfl:    Multiple Vitamins-Minerals (MULTIVITAMIN WITH MINERALS) tablet, Take 1 tablet by mouth daily., Disp: , Rfl:    TURMERIC PO, Take 1 tablet by mouth daily., Disp: , Rfl:    vitamin C (ASCORBIC ACID) 250 MG tablet, Take 250 mg by mouth daily., Disp: , Rfl:    Zinc 50 MG CAPS, Take 1 capsule by mouth daily., Disp: , Rfl:    hydrOXYzine (VISTARIL) 25 MG capsule, Take 1 capsule (25 mg total) by mouth 3 (three) times daily as needed., Disp: 30 capsule, Rfl: 2   ipratropium (ATROVENT) 0.06 % nasal spray, Place 2 sprays into the nose 3 (three) times daily. (Patient not taking: No sig reported), Disp: 15 mL, Rfl: 2   meclizine (ANTIVERT) 12.5 MG tablet, Take 1 tablet (12.5 mg total) by mouth 3 (three) times daily as needed for dizziness. (Patient not taking: Reported on 11/19/2020),  Disp: 30 tablet, Rfl: 0   prednisoLONE acetate (PRED FORTE) 1 % ophthalmic suspension, Place 1 drop into the left eye 4 (four) times daily. (Patient not taking: No sig reported), Disp: 5 mL, Rfl: 1   Vitamin D, Ergocalciferol, (DRISDOL) 1.25 MG (50000 UNIT) CAPS capsule, TAKE 1 CAPSULE (50,000 UNITS TOTAL) BY MOUTH EVERY 7 (SEVEN) DAYS. (Patient not taking: Reported on 11/19/2020), Disp: 12 capsule, Rfl: 0   Allergies  Allergen Reactions   Apple Anaphylaxis   Carrot [Daucus Carota] Anaphylaxis   Iodinated Diagnostic Agents Shortness Of Breath and Swelling    PER PATIENT BREAK THROUGH REACTION WITH PRE  MEDS-2016-----Can have contrast as long as he has a 13 prep   Iohexol Shortness Of Breath and Swelling    Pt requires 13hr premeds.   Oat Anaphylaxis   Soy Allergy Anaphylaxis   Wheat Anaphylaxis   Almond Meal Itching   Cow's Milk [Lac Bovis]     Hives, and upset stomach      Review of Systems  Constitutional: Negative.   HENT: Negative.    Eyes: Negative.  Negative for blurred vision.  Respiratory: Negative.  Negative for shortness of breath and wheezing.   Cardiovascular: Negative.   Gastrointestinal: Negative.   Musculoskeletal: Negative.   Skin: Negative.   Allergic/Immunologic: Negative.   Neurological: Negative.  Negative for headaches.  Hematological: Negative.   Psychiatric/Behavioral: Negative.      Today's Vitals   11/19/20 0834  BP: 110/80  Pulse: 72  Temp: 97.9 F (36.6 C)  Weight: 210 lb 9.6 oz (95.5 kg)  Height: '5\' 9"'  (1.753 m)  PainSc: 8    Body mass index is 31.1 kg/m.  Wt Readings from Last 3 Encounters:  11/19/20 210 lb 9.6 oz (95.5 kg)  08/15/20 211 lb 12.8 oz (96.1 kg)  04/08/20 215 lb 3.2 oz (97.6 kg)    Objective:  Physical Exam Vitals reviewed.  Constitutional:      General: He is not in acute distress.    Appearance: Normal appearance. He is not ill-appearing.  Eyes:     Conjunctiva/sclera: Conjunctivae normal.     Pupils: Pupils are equal, round, and reactive to light.  Cardiovascular:     Rate and Rhythm: Normal rate and regular rhythm.     Pulses: Normal pulses.     Heart sounds: Normal heart sounds. No murmur heard. Pulmonary:     Effort: Pulmonary effort is normal. No respiratory distress.     Breath sounds: Normal breath sounds. No wheezing.  Musculoskeletal:     Cervical back: No muscular tenderness.  Skin:    General: Skin is warm and dry.     Capillary Refill: Capillary refill takes less than 2 seconds.  Neurological:     General: No focal deficit present.     Mental Status: He is alert and oriented to person, place,  and time.     Cranial Nerves: No cranial nerve deficit.     Motor: No weakness.  Psychiatric:        Mood and Affect: Mood normal.        Behavior: Behavior normal.        Thought Content: Thought content normal.        Judgment: Judgment normal.        Assessment And Plan:     1. Benign hypertension with CKD (chronic kidney disease) stage III (HCC) Comments: Well controlled, he will no longer go to Dr. Lyda Kalata (Nephrology) unless his kidney functions decline. Kidney functions  are similar to 2015.   2. Abnormal glucose Comments: Stable, no current medications.  No labs this visit will check in Jan 2023  3. Chronic bilateral low back pain without sciatica Comments: Continue follow up with Dr. Christella Noa, advised to call and schedule f/u appt.      Patient was given opportunity to ask questions. Patient verbalized understanding of the plan and was able to repeat key elements of the plan. All questions were answered to their satisfaction.  [name], FNP   I, [name], FNP, have reviewed all documentation for this visit. The documentation on 11/19/20 for the exam, diagnosis, procedures, and orders are all accurate and complete.   IF YOU HAVE BEEN REFERRED TO A SPECIALIST, IT MAY TAKE 1-2 WEEKS TO SCHEDULE/PROCESS THE REFERRAL. IF YOU HAVE NOT HEARD FROM US/SPECIALIST IN TWO WEEKS, PLEASE GIVE [place] A CALL AT [phone].   THE PATIENT IS ENCOURAGED TO PRACTICE SOCIAL DISTANCING DUE TO THE COVID-19 PANDEMIC.

## 2021-01-06 DIAGNOSIS — M48062 Spinal stenosis, lumbar region with neurogenic claudication: Secondary | ICD-10-CM | POA: Diagnosis not present

## 2021-01-06 DIAGNOSIS — Z6830 Body mass index (BMI) 30.0-30.9, adult: Secondary | ICD-10-CM | POA: Diagnosis not present

## 2021-01-06 DIAGNOSIS — G9519 Other vascular myelopathies: Secondary | ICD-10-CM | POA: Diagnosis not present

## 2021-01-16 ENCOUNTER — Ambulatory Visit (INDEPENDENT_AMBULATORY_CARE_PROVIDER_SITE_OTHER): Payer: Medicare HMO

## 2021-01-16 DIAGNOSIS — I495 Sick sinus syndrome: Secondary | ICD-10-CM

## 2021-01-17 LAB — CUP PACEART REMOTE DEVICE CHECK
Battery Impedance: 1068 Ohm
Battery Remaining Longevity: 66 mo
Battery Voltage: 2.77 V
Brady Statistic AP VP Percent: 1 %
Brady Statistic AP VS Percent: 94 %
Brady Statistic AS VP Percent: 0 %
Brady Statistic AS VS Percent: 5 %
Date Time Interrogation Session: 20221223104115
Implantable Lead Implant Date: 20050106
Implantable Lead Implant Date: 20131122
Implantable Lead Location: 753859
Implantable Lead Location: 753860
Implantable Lead Model: 5092
Implantable Lead Model: 5594
Implantable Pulse Generator Implant Date: 20131122
Lead Channel Impedance Value: 425 Ohm
Lead Channel Impedance Value: 531 Ohm
Lead Channel Pacing Threshold Amplitude: 0.5 V
Lead Channel Pacing Threshold Amplitude: 0.625 V
Lead Channel Pacing Threshold Pulse Width: 0.4 ms
Lead Channel Pacing Threshold Pulse Width: 0.4 ms
Lead Channel Setting Pacing Amplitude: 1.5 V
Lead Channel Setting Pacing Amplitude: 2 V
Lead Channel Setting Pacing Pulse Width: 0.4 ms
Lead Channel Setting Sensing Sensitivity: 2.8 mV

## 2021-01-28 NOTE — Progress Notes (Signed)
Remote pacemaker transmission.   

## 2021-02-05 ENCOUNTER — Other Ambulatory Visit: Payer: Self-pay | Admitting: Nurse Practitioner

## 2021-02-05 DIAGNOSIS — I10 Essential (primary) hypertension: Secondary | ICD-10-CM

## 2021-02-15 DIAGNOSIS — K1120 Sialoadenitis, unspecified: Secondary | ICD-10-CM | POA: Diagnosis not present

## 2021-02-20 ENCOUNTER — Other Ambulatory Visit: Payer: Self-pay

## 2021-02-20 ENCOUNTER — Ambulatory Visit (INDEPENDENT_AMBULATORY_CARE_PROVIDER_SITE_OTHER): Payer: Medicare HMO | Admitting: Nurse Practitioner

## 2021-02-20 ENCOUNTER — Encounter: Payer: Self-pay | Admitting: Nurse Practitioner

## 2021-02-20 ENCOUNTER — Ambulatory Visit (INDEPENDENT_AMBULATORY_CARE_PROVIDER_SITE_OTHER): Payer: Medicare HMO

## 2021-02-20 VITALS — BP 130/80 | HR 99 | Temp 97.8°F | Ht 69.0 in | Wt 212.2 lb

## 2021-02-20 VITALS — BP 130/80 | HR 99 | Temp 97.8°F | Ht 69.0 in | Wt 212.0 lb

## 2021-02-20 DIAGNOSIS — I129 Hypertensive chronic kidney disease with stage 1 through stage 4 chronic kidney disease, or unspecified chronic kidney disease: Secondary | ICD-10-CM

## 2021-02-20 DIAGNOSIS — Z23 Encounter for immunization: Secondary | ICD-10-CM | POA: Diagnosis not present

## 2021-02-20 DIAGNOSIS — N183 Chronic kidney disease, stage 3 unspecified: Secondary | ICD-10-CM

## 2021-02-20 DIAGNOSIS — Z8616 Personal history of COVID-19: Secondary | ICD-10-CM

## 2021-02-20 DIAGNOSIS — Z0000 Encounter for general adult medical examination without abnormal findings: Secondary | ICD-10-CM

## 2021-02-20 DIAGNOSIS — Z950 Presence of cardiac pacemaker: Secondary | ICD-10-CM | POA: Diagnosis not present

## 2021-02-20 DIAGNOSIS — G8929 Other chronic pain: Secondary | ICD-10-CM

## 2021-02-20 DIAGNOSIS — I10 Essential (primary) hypertension: Secondary | ICD-10-CM

## 2021-02-20 DIAGNOSIS — E559 Vitamin D deficiency, unspecified: Secondary | ICD-10-CM

## 2021-02-20 DIAGNOSIS — R7309 Other abnormal glucose: Secondary | ICD-10-CM

## 2021-02-20 DIAGNOSIS — M545 Low back pain, unspecified: Secondary | ICD-10-CM | POA: Diagnosis not present

## 2021-02-20 LAB — POCT URINALYSIS DIPSTICK
Bilirubin, UA: NEGATIVE
Blood, UA: NEGATIVE
Glucose, UA: NEGATIVE
Ketones, UA: NEGATIVE
Nitrite, UA: NEGATIVE
Protein, UA: NEGATIVE
Spec Grav, UA: 1.02 (ref 1.010–1.025)
Urobilinogen, UA: 0.2 E.U./dL
pH, UA: 7 (ref 5.0–8.0)

## 2021-02-20 LAB — POCT UA - MICROALBUMIN
Albumin/Creatinine Ratio, Urine, POC: <30
Creatinine, POC: 200 mg/dL
Microalbumin Ur, POC: 30 mg/L

## 2021-02-20 MED ORDER — BOOSTRIX 5-2.5-18.5 LF-MCG/0.5 IM SUSP: 0.5000 mL | Freq: Once | INTRAMUSCULAR | 0 refills | Status: AC

## 2021-02-20 NOTE — Patient Instructions (Signed)

## 2021-02-20 NOTE — Patient Instructions (Signed)
Aaron Snyder , Thank you for taking time to come for your Medicare Wellness Visit. I appreciate your ongoing commitment to your health goals. Please review the following plan we discussed and let me know if I can assist you in the future.   Screening recommendations/referrals: Colonoscopy: cologuard 03/05/2020, due 03/06/2023 Recommended yearly ophthalmology/optometry visit for glaucoma screening and checkup Recommended yearly dental visit for hygiene and checkup  Vaccinations: Influenza vaccine: completed 11/05/2020 Pneumococcal vaccine: completed 06/01/2019 Tdap vaccine: due Shingles vaccine: completed   Covid-19:  11/21/2020, 06/14/2020, 10/28/2019, 04/11/2019, 03/21/2019  Advanced directives: Advance directive discussed with you today. Even though you declined this today please call our office should you change your mind and we can give you the proper paperwork for you to fill out.  Conditions/risks identified: none  Next appointment: Follow up in one year for your annual wellness visit.   Preventive Care 19 Years and Older, Male Preventive care refers to lifestyle choices and visits with your health care provider that can promote health and wellness. What does preventive care include? A yearly physical exam. This is also called an annual well check. Dental exams once or twice a year. Routine eye exams. Ask your health care provider how often you should have your eyes checked. Personal lifestyle choices, including: Daily care of your teeth and gums. Regular physical activity. Eating a healthy diet. Avoiding tobacco and drug use. Limiting alcohol use. Practicing safe sex. Taking low doses of aspirin every day. Taking vitamin and mineral supplements as recommended by your health care provider. What happens during an annual well check? The services and screenings done by your health care provider during your annual well check will depend on your age, overall health, lifestyle risk factors,  and family history of disease. Counseling  Your health care provider may ask you questions about your: Alcohol use. Tobacco use. Drug use. Emotional well-being. Home and relationship well-being. Sexual activity. Eating habits. History of falls. Memory and ability to understand (cognition). Work and work Statistician. Screening  You may have the following tests or measurements: Height, weight, and BMI. Blood pressure. Lipid and cholesterol levels. These may be checked every 5 years, or more frequently if you are over 23 years old. Skin check. Lung cancer screening. You may have this screening every year starting at age 65 if you have a 30-pack-year history of smoking and currently smoke or have quit within the past 15 years. Fecal occult blood test (FOBT) of the stool. You may have this test every year starting at age 27. Flexible sigmoidoscopy or colonoscopy. You may have a sigmoidoscopy every 5 years or a colonoscopy every 10 years starting at age 66. Prostate cancer screening. Recommendations will vary depending on your family history and other risks. Hepatitis C blood test. Hepatitis B blood test. Sexually transmitted disease (STD) testing. Diabetes screening. This is done by checking your blood sugar (glucose) after you have not eaten for a while (fasting). You may have this done every 1-3 years. Abdominal aortic aneurysm (AAA) screening. You may need this if you are a current or former smoker. Osteoporosis. You may be screened starting at age 86 if you are at high risk. Talk with your health care provider about your test results, treatment options, and if necessary, the need for more tests. Vaccines  Your health care provider may recommend certain vaccines, such as: Influenza vaccine. This is recommended every year. Tetanus, diphtheria, and acellular pertussis (Tdap, Td) vaccine. You may need a Td booster every 10 years. Zoster  vaccine. You may need this after age  14. Pneumococcal 13-valent conjugate (PCV13) vaccine. One dose is recommended after age 39. Pneumococcal polysaccharide (PPSV23) vaccine. One dose is recommended after age 9. Talk to your health care provider about which screenings and vaccines you need and how often you need them. This information is not intended to replace advice given to you by your health care provider. Make sure you discuss any questions you have with your health care provider. Document Released: 02/08/2015 Document Revised: 10/02/2015 Document Reviewed: 11/13/2014 Elsevier Interactive Patient Education  2017 [place] Prevention in the Home Falls can cause injuries. They can happen to people of all ages. There are many things you can do to make your home safe and to help prevent falls. What can I do on the outside of my home? Regularly fix the edges of walkways and driveways and fix any cracks. Remove anything that might make you trip as you walk through a door, such as a raised step or threshold. Trim any bushes or trees on the path to your home. Use bright outdoor lighting. Clear any walking paths of anything that might make someone trip, such as rocks or tools. Regularly check to see if handrails are loose or broken. Make sure that both sides of any steps have handrails. Any raised decks and porches should have guardrails on the edges. Have any leaves, snow, or ice cleared regularly. Use sand or salt on walking paths during winter. Clean up any spills in your garage right away. This includes oil or grease spills. What can I do in the bathroom? Use night lights. Install grab bars by the toilet and in the tub and shower. Do not use towel bars as grab bars. Use non-skid mats or decals in the tub or shower. If you need to sit down in the shower, use a plastic, non-slip stool. Keep the floor dry. Clean up any water that spills on the floor as soon as it happens. Remove soap buildup in the tub or shower  regularly. Attach bath mats securely with double-sided non-slip rug tape. Do not have throw rugs and other things on the floor that can make you trip. What can I do in the bedroom? Use night lights. Make sure that you have a light by your bed that is easy to reach. Do not use any sheets or blankets that are too big for your bed. They should not hang down onto the floor. Have a firm chair that has side arms. You can use this for support while you get dressed. Do not have throw rugs and other things on the floor that can make you trip. What can I do in the kitchen? Clean up any spills right away. Avoid walking on wet floors. Keep items that you use a lot in easy-to-reach places. If you need to reach something above you, use a strong step stool that has a grab bar. Keep electrical cords out of the way. Do not use floor polish or wax that makes floors slippery. If you must use wax, use non-skid floor wax. Do not have throw rugs and other things on the floor that can make you trip. What can I do with my stairs? Do not leave any items on the stairs. Make sure that there are handrails on both sides of the stairs and use them. Fix handrails that are broken or loose. Make sure that handrails are as long as the stairways. Check any carpeting to make sure that it  is firmly attached to the stairs. Fix any carpet that is loose or worn. Avoid having throw rugs at the top or bottom of the stairs. If you do have throw rugs, attach them to the floor with carpet tape. Make sure that you have a light switch at the top of the stairs and the bottom of the stairs. If you do not have them, ask someone to add them for you. What else can I do to help prevent falls? Wear shoes that: Do not have high heels. Have rubber bottoms. Are comfortable and fit you well. Are closed at the toe. Do not wear sandals. If you use a stepladder: Make sure that it is fully opened. Do not climb a closed stepladder. Make sure that  both sides of the stepladder are locked into place. Ask someone to hold it for you, if possible. Clearly mark and make sure that you can see: Any grab bars or handrails. First and last steps. Where the edge of each step is. Use tools that help you move around (mobility aids) if they are needed. These include: Canes. Walkers. Scooters. Crutches. Turn on the lights when you go into a dark area. Replace any light bulbs as soon as they burn out. Set up your furniture so you have a clear path. Avoid moving your furniture around. If any of your floors are uneven, fix them. If there are any pets around you, be aware of where they are. Review your medicines with your doctor. Some medicines can make you feel dizzy. This can increase your chance of falling. Ask your doctor what other things that you can do to help prevent falls. This information is not intended to replace advice given to you by your health care provider. Make sure you discuss any questions you have with your health care provider. Document Released: 11/08/2008 Document Revised: 06/20/2015 Document Reviewed: 02/16/2014 Elsevier Interactive Patient Education  2017 [employer].

## 2021-02-20 NOTE — Addendum Note (Signed)
Addended by: Glenna Durand E on: 02/20/2021 12:14 PM   Modules accepted: Orders

## 2021-02-20 NOTE — Progress Notes (Signed)
This visit occurred during the SARS-CoV-2 public health emergency.  Safety protocols were in place, including screening questions prior to the visit, additional usage of staff PPE, and extensive cleaning of exam room while observing appropriate contact time as indicated for disinfecting solutions.  Subjective:   Aaron Snyder is a 71 y.o. male who presents for Medicare Annual/Subsequent preventive examination.  Review of Systems     Cardiac Risk Factors include: advanced age (>73men, >70 women);hypertension;male gender;obesity (BMI >30kg/m2)     Objective:    Today's Vitals   02/20/21 0939 02/20/21 0945  BP: 130/80   Pulse: 99   Temp: 97.8 F (36.6 C)   TempSrc: Oral   SpO2: 95%   Weight: 212 lb 3.2 oz (96.3 kg)   Height: 5\' 9"  (1.753 m)   PainSc:  8    Body mass index is 31.34 kg/m.  Advanced Directives 02/20/2021 02/15/2020 02/09/2019 02/03/2018 12/26/2014 10/14/2013 12/15/2011  Does Patient Have a Medical Advance Directive? No No No No No No Patient does not have advance directive;Patient would not like information  Would patient like information on creating a medical advance directive? No - Patient declined No - Patient declined Yes (MAU/Ambulatory/Procedural Areas - Information given) Yes (MAU/Ambulatory/Procedural Areas - Information given) Yes - Educational materials given No - patient declined information -    Current Medications (verified) Outpatient Encounter Medications as of 02/20/2021  Medication Sig   amLODipine (NORVASC) 10 MG tablet Take 1 tablet (10 mg total) by mouth daily.   aspirin EC 81 MG tablet Take 81 mg by mouth daily.   Candesartan Cilexetil-HCTZ 32-25 MG TABS TAKE 1 TABLET BY MOUTH EVERY DAY   cholecalciferol (VITAMIN D3) 25 MCG (1000 UNIT) tablet Take 1,000 Units by mouth daily.   hydrOXYzine (VISTARIL) 25 MG capsule Take 1 capsule (25 mg total) by mouth 3 (three) times daily as needed.   loratadine (CLARITIN) 10 MG tablet Take 10 mg by mouth daily.    meclizine (ANTIVERT) 12.5 MG tablet Take 1 tablet (12.5 mg total) by mouth 3 (three) times daily as needed for dizziness.   Multiple Vitamins-Minerals (MULTIVITAMIN WITH MINERALS) tablet Take 1 tablet by mouth daily.   Tdap (BOOSTRIX) 5-2.5-18.5 LF-MCG/0.5 injection Inject 0.5 mLs into the muscle once for 1 dose.   TURMERIC PO Take 1 tablet by mouth daily.   vitamin C (ASCORBIC ACID) 250 MG tablet Take 250 mg by mouth daily.   Zinc 50 MG CAPS Take 1 capsule by mouth daily.   ipratropium (ATROVENT) 0.06 % nasal spray Place 2 sprays into the nose 3 (three) times daily. (Patient not taking: No sig reported)   prednisoLONE acetate (PRED FORTE) 1 % ophthalmic suspension Place 1 drop into the left eye 4 (four) times daily. (Patient not taking: Reported on 02/15/2020)   Vitamin D, Ergocalciferol, (DRISDOL) 1.25 MG (50000 UNIT) CAPS capsule TAKE 1 CAPSULE (50,000 UNITS TOTAL) BY MOUTH EVERY 7 (SEVEN) DAYS. (Patient not taking: Reported on 11/19/2020)   No facility-administered encounter medications on file as of 02/20/2021.    Allergies (verified) Apple, Carrot [daucus carota], Iodinated contrast media, Iohexol, Oat, Soy allergy, Wheat, Almond meal, and Cow's milk [lac bovis]   History: Past Medical History:  Diagnosis Date   Coronary artery disease    Dysphagia    Head injury    Hypertension    Pacemaker generator end of life 12/18/2011   Medtronic   Presence of permanent cardiac pacemaker 02/01/2003   Medtronic   Syncope    Past Surgical  History:  Procedure Laterality Date   CARDIAC CATHETERIZATION N/A 12/26/2014   Procedure: Left Heart Cath and Coronary Angiography;  Surgeon: [name], MD;  Location: [place] [facility];  Service: Cardiovascular;  Laterality: N/A;   ESOPHAGOGASTRODUODENOSCOPY     [place]   LEFT AND RIGHT HEART CATHETERIZATION WITH CORONARY ANGIOGRAM N/A 12/17/2011   Procedure: LEFT AND RIGHT HEART CATHETERIZATION WITH CORONARY ANGIOGRAM;  Surgeon: [name], MD;   Location: [facility] CATH [facility];  Service: Cardiovascular;  Laterality: N/A;   LOOP RECORDER IMPLANT/EXPLANT  2005   NM MYOCAR PERF WALL MOTION  01/2011   Negative   PACEMAKER GENERATOR CHANGE  12/18/2011   Medtronic   PACEMAKER INSERTION  2005   Medtronic   PERMANENT PACEMAKER GENERATOR CHANGE N/A 12/18/2011   Procedure: PERMANENT PACEMAKER GENERATOR CHANGE;  Surgeon: [name], MD;  Location: [facility] CATH LAB;  Service: Cardiovascular;  Laterality: N/A;   TOTAL SHOULDER ARTHROPLASTY     Family History  Problem Relation Age of Onset   Diabetes Father    Hypertension Father    Social History   Socioeconomic History   Marital status: Widowed    Spouse name: Not on file   Number of children: Not on file   Years of education: Not on file   Highest education level: Not on file  Occupational History   Occupation: Disabled    Employer: UNEMPLOYED  Tobacco Use   Smoking status: Former    Packs/day: 1.00    Years: 15.00    Pack years: 15.00    Types: Cigarettes    Quit date: 03/15/1996    Years since quitting: 24.9   Smokeless tobacco: Never  Vaping Use   Vaping Use: Never used  Substance and Sexual Activity   Alcohol use: No   Drug use: Not Currently    Types: Marijuana   Sexual activity: Not Currently  Other Topics Concern   Not on file  Social History Narrative   Not on file   Social Determinants of Health   Financial Resource Strain: Low Risk    Difficulty of Paying Living Expenses: Not hard at all  Food Insecurity: No Food Insecurity   Worried About Charity fundraiser in the Last Year: Never true   [name] in the Last Year: Never true  Transportation Needs: No Transportation Needs   Lack of Transportation (Medical): No   Lack of Transportation (Non-Medical): No  Physical Activity: Sufficiently Active   Days of Exercise per Week: 5 days   Minutes of Exercise per Session: 90 min  Stress: Stress Concern Present   Feeling of Stress : Rather much  Social  Connections: Moderately Isolated   Frequency of Communication with Friends and Family: More than three times a week   Frequency of Social Gatherings with Friends and Family: Not on file   Attends Religious Services: 1 to 4 times per year   Active Member of [employer] or Organizations: No   Attends Archivist Meetings: Not on file   Marital Status: Widowed    Tobacco Counseling Counseling given: Not Answered   Clinical Intake:  Pre-visit preparation completed: Yes  Pain : 0-10 Pain Score: 8  Pain Type: Chronic pain Pain Location: Back (neck at a 6) Pain Orientation: Lower Pain Descriptors / Indicators: Shooting Pain Onset: More than a month ago Pain Frequency: Constant     Nutritional Status: BMI > 30  Obese Nutritional Risks: Nausea/ vomitting/ diarrhea (some vomiting, has resolved) Diabetes:  No  How often do you need to have someone help you when you read instructions, pamphlets, or other written materials from your doctor or pharmacy?: 1 - Never  Diabetic? no  Interpreter Needed?: No  Information entered by :: NAllen LPN   Activities of Daily Living In your present state of health, do you have any difficulty performing the following activities: 02/20/2021  Hearing? N  Vision? Y  Comment some blurriness at times  Difficulty concentrating or making decisions? N  Walking or climbing stairs? Y  Dressing or bathing? N  Doing errands, shopping? N  Preparing Food and eating ? N  Using the Toilet? N  In the past six months, have you accidently leaked urine? N  Do you have problems with loss of bowel control? N  Managing your Medications? N  Managing your Finances? N  Housekeeping or managing your Housekeeping? N  Some recent data might be hidden    Patient Care Team: [name], FNP as PCP - General (General Practice) Warden Fillers, MD as Consulting Physician (Ophthalmology)  Indicate any recent Medical Services you may have received from other  than [facility] providers in the past year (date may be approximate).     Assessment:   This is a routine wellness examination for [place].  Hearing/Vision screen Vision Screening - Comments:: No regular eye exams,  Dietary issues and exercise activities discussed: Current Exercise Habits: Home exercise routine, Type of exercise: calisthenics;strength training/weights, Time (Minutes): > 60, Frequency (Times/Week): 5, Weekly Exercise (Minutes/Week): 0   Goals Addressed             This Visit's Progress    Patient Stated       02/20/2021, get back straight       Depression Screen PHQ 2/9 Scores 02/20/2021 02/15/2020 03/13/2019 02/09/2019 08/04/2018 02/03/2018 02/03/2018  PHQ - 2 Score 0 0 0 1 0 4 0  PHQ- 9 Score - - - 7 - 13 -    Fall Risk Fall Risk  02/20/2021 02/15/2020 03/13/2019 02/09/2019 08/04/2018  Falls in the past year? 0 1 0 0 0  Comment - got dizzy - - -  Risk for fall due to : Medication side effect Medication side effect - Medication side effect;Impaired balance/gait -  Follow up Falls evaluation completed;Education provided;Falls prevention discussed Falls evaluation completed;Education provided;Falls prevention discussed - Falls evaluation completed;Education provided;Falls prevention discussed -    FALL RISK PREVENTION PERTAINING TO THE HOME:  Any stairs in or around the home? Yes  If so, are there any without handrails? No  Home free of loose throw rugs in walkways, pet beds, electrical cords, etc? Yes  Adequate lighting in your home to reduce risk of falls? Yes   ASSISTIVE DEVICES UTILIZED TO PREVENT FALLS:  Life alert? No  Use of a cane, walker or w/c? No  Grab bars in the bathroom? No  Shower chair or bench in shower? No  Elevated toilet seat or a handicapped toilet? No   TIMED UP AND GO:  Was the test performed? No .  .   Gait slow and steady without use of assistive device  Cognitive Function:     6CIT Screen 02/20/2021 02/15/2020 02/09/2019 02/03/2018  What  Year? 0 points 0 points 0 points 0 points  What month? 0 points 0 points 0 points 0 points  What time? 0 points 0 points 0 points 3 points  Count back from 20 0 points 0 points 0 points 0 points  Months in reverse 0  points 0 points 2 points 0 points  Repeat phrase 0 points 0 points 0 points 0 points  Total Score 0 0 2 3    Immunizations Immunization History  Administered Date(s) Administered   Influenza, High Dose Seasonal PF 11/08/2018, 10/16/2019   Influenza,inj,Quad PF,6+ Mos 10/16/2013   Influenza-Unspecified 10/13/2017, 10/16/2019, 11/05/2020   PFIZER Comirnaty(Gray Top)Covid-19 Tri-Sucrose Vaccine 06/14/2020   PFIZER(Purple Top)SARS-COV-2 Vaccination 03/21/2019, 04/11/2019, 10/28/2019   Pfizer Covid-19 Vaccine Bivalent Booster 24yrs & up 11/21/2020   Pneumococcal Conjugate-13 03/02/2018   Pneumococcal Polysaccharide-23 06/01/2019   Zoster Recombinat (Shingrix) 05/09/2020, 11/05/2020    TDAP status: Due, Education has been provided regarding the importance of this vaccine. Advised may receive this vaccine at local pharmacy or Health Dept. Aware to provide a copy of the vaccination record if obtained from local pharmacy or Health Dept. Verbalized acceptance and understanding.  Flu Vaccine status: Up to date  Pneumococcal vaccine status: Up to date  Covid-19 vaccine status: Completed vaccines  Qualifies for Shingles Vaccine? Yes   Zostavax completed No   Shingrix Completed?: Yes  Screening Tests Health Maintenance  Topic Date Due   TETANUS/TDAP  Never done   Fecal DNA (Cologuard)  03/06/2023   Pneumonia Vaccine 57+ Years old  Completed   INFLUENZA VACCINE  Completed   COVID-19 Vaccine  Completed   Hepatitis C Screening  Completed   Zoster Vaccines- Shingrix  Completed   HPV VACCINES  Aged Out    Health Maintenance  Health Maintenance Due  Topic Date Due   TETANUS/TDAP  Never done    Colorectal cancer screening: Type of screening: Cologuard. Completed  03/05/2020. Repeat every 3 years  Lung Cancer Screening: (Low Dose CT Chest recommended if Age 77-80 years, 30 pack-year currently smoking OR have quit w/in 15years.) does not qualify.   Lung Cancer Screening Referral: no  Additional Screening:  Hepatitis C Screening: does qualify; Completed 02/03/2018  Vision Screening: Recommended annual ophthalmology exams for early detection of glaucoma and other disorders of the eye. Is the patient up to date with their annual eye exam?  No  Who is the provider or what is the name of the office in which the patient attends annual eye exams? none If pt is not established with a provider, would they like to be referred to a provider to establish care? No .   Dental Screening: Recommended annual dental exams for proper oral hygiene  Community Resource Referral / Chronic Care Management: CRR required this visit?  No   CCM required this visit?  No      Plan:     I have personally reviewed and noted the following in the patients chart:   Medical and social history Use of alcohol, tobacco or illicit drugs  Current medications and supplements including opioid prescriptions. Patient is not currently taking opioid prescriptions. Functional ability and status Nutritional status Physical activity Advanced directives List of other physicians Hospitalizations, surgeries, and ER visits in previous 12 months Vitals Screenings to include cognitive, depression, and falls Referrals and appointments  In addition, I have reviewed and discussed with patient certain preventive protocols, quality metrics, and best practice recommendations. A written personalized care plan for preventive services as well as general preventive health recommendations were provided to patient.     Kellie Simmering, LPN   6/33/3545   Nurse Notes: none

## 2021-02-20 NOTE — Progress Notes (Signed)
I,Tianna Badgett,acting as a Education administrator for [employer], FNP.,have documented all relevant documentation on the behalf of [name], FNP,as directed by  [name], FNP while in the presence of [name], [place].  This visit occurred during the SARS-CoV-2 public health emergency.  Safety protocols were in place, including screening questions prior to the visit, additional usage of staff PPE, and extensive cleaning of exam room while observing appropriate contact time as indicated for disinfecting solutions.  Subjective:     Patient ID: Aaron Snyder , male    DOB: 02-12-1950 , 71 y.o.   MRN: [id number]   Chief Complaint  Patient presents with   Diabetes    HPI  Pt here for DM/HTN f/u. He also had his AWV this morning. He had a telehealth visit with [facility] for a swollen area underneath his tongue. He was also given an antibiotic and is doing better. He also seen Dr. Christella Noa who wants to fuse the back of his neck .   Diabetes He presents for his follow-up diabetic visit. Pertinent negatives for hypoglycemia include no headaches. Pertinent negatives for diabetes include no blurred vision. There are no hypoglycemic complications. There are no diabetic complications. Current diabetic treatment includes diet.  Hypertension This is a chronic problem. The current episode started more than 1 year ago. The problem is unchanged. The problem is controlled. Associated symptoms include anxiety. Pertinent negatives include no blurred vision, headaches or shortness of breath. Risk factors for coronary artery disease include obesity and sedentary lifestyle. There is no history of angina.    Past Medical History:  Diagnosis Date   Coronary artery disease    Dysphagia    Head injury    Hypertension    Pacemaker generator end of life 12/18/2011   Medtronic   Presence of permanent cardiac pacemaker 02/01/2003   Medtronic   Syncope      Family History  Problem Relation Age of Onset   Diabetes  Father    Hypertension Father      Current Outpatient Medications:    amLODipine (NORVASC) 10 MG tablet, Take 1 tablet (10 mg total) by mouth daily., Disp: 90 tablet, Rfl: 1   aspirin EC 81 MG tablet, Take 81 mg by mouth daily., Disp: , Rfl:    Candesartan Cilexetil-HCTZ 32-25 MG TABS, TAKE 1 TABLET BY MOUTH EVERY DAY, Disp: 90 tablet, Rfl: 1   cholecalciferol (VITAMIN D3) 25 MCG (1000 UNIT) tablet, Take 1,000 Units by mouth daily., Disp: , Rfl:    hydrOXYzine (VISTARIL) 25 MG capsule, Take 1 capsule (25 mg total) by mouth 3 (three) times daily as needed., Disp: 30 capsule, Rfl: 2   ipratropium (ATROVENT) 0.06 % nasal spray, Place 2 sprays into the nose 3 (three) times daily. (Patient not taking: No sig reported), Disp: 15 mL, Rfl: 2   loratadine (CLARITIN) 10 MG tablet, Take 10 mg by mouth daily., Disp: , Rfl:    meclizine (ANTIVERT) 12.5 MG tablet, Take 1 tablet (12.5 mg total) by mouth 3 (three) times daily as needed for dizziness., Disp: 30 tablet, Rfl: 0   Multiple Vitamins-Minerals (MULTIVITAMIN WITH MINERALS) tablet, Take 1 tablet by mouth daily., Disp: , Rfl:    prednisoLONE acetate (PRED FORTE) 1 % ophthalmic suspension, Place 1 drop into the left eye 4 (four) times daily. (Patient not taking: Reported on 02/15/2020), Disp: 5 mL, Rfl: 1   Tdap (BOOSTRIX) 5-2.5-18.5 LF-MCG/0.5 injection, Inject 0.5 mLs into the muscle once for 1 dose., Disp: 0.5 mL, Rfl: 0  TURMERIC PO, Take 1 tablet by mouth daily., Disp: , Rfl:    vitamin C (ASCORBIC ACID) 250 MG tablet, Take 250 mg by mouth daily., Disp: , Rfl:    Vitamin D, Ergocalciferol, (DRISDOL) 1.25 MG (50000 UNIT) CAPS capsule, TAKE 1 CAPSULE (50,000 UNITS TOTAL) BY MOUTH EVERY 7 (SEVEN) DAYS. (Patient not taking: Reported on 11/19/2020), Disp: 12 capsule, Rfl: 0   Zinc 50 MG CAPS, Take 1 capsule by mouth daily., Disp: , Rfl:    Allergies  Allergen Reactions   Apple Anaphylaxis   Carrot [Daucus Carota] Anaphylaxis   Iodinated Contrast  Media Shortness Of Breath and Swelling    PER PATIENT BREAK THROUGH REACTION WITH PRE MEDS-2016-----Can have contrast as long as he has a 13 prep   Iohexol Shortness Of Breath and Swelling    Pt requires 13hr premeds.   Oat Anaphylaxis   Soy Allergy Anaphylaxis   Wheat Anaphylaxis   Almond Meal Itching   Cow's Milk [Lac Bovis]     Hives, and upset stomach      Review of Systems  Constitutional: Negative.   Eyes:  Negative for blurred vision.  Respiratory: Negative.  Negative for shortness of breath.   Cardiovascular: Negative.   Gastrointestinal: Negative.   Neurological: Negative.  Negative for headaches.    Today's Vitals   02/20/21 0956  BP: 130/80  Pulse: 99  Temp: 97.8 F (36.6 C)  TempSrc: Oral  Weight: 212 lb (96.2 kg)  Height: '5\' 9"'  (1.753 m)   Body mass index is 31.31 kg/m.   Objective:  Physical Exam Vitals reviewed.  Constitutional:      General: He is not in acute distress.    Appearance: Normal appearance. He is not ill-appearing.  Eyes:     Conjunctiva/sclera: Conjunctivae normal.     Pupils: Pupils are equal, round, and reactive to light.  Cardiovascular:     Rate and Rhythm: Normal rate and regular rhythm.     Pulses: Normal pulses.     Heart sounds: Normal heart sounds. No murmur heard. Pulmonary:     Effort: Pulmonary effort is normal. No respiratory distress.     Breath sounds: Normal breath sounds. No wheezing.  Musculoskeletal:     Cervical back: No muscular tenderness.  Skin:    General: Skin is warm and dry.     Capillary Refill: Capillary refill takes less than 2 seconds.  Neurological:     General: No focal deficit present.     Mental Status: He is alert and oriented to person, place, and time.     Cranial Nerves: No cranial nerve deficit.     Motor: No weakness.  Psychiatric:        Mood and Affect: Mood normal.        Behavior: Behavior normal.        Thought Content: Thought content normal.        Judgment: Judgment normal.         Assessment And Plan:     1. Benign hypertension with CKD (chronic kidney disease) stage III (HCC) Comments: Blood pressure is controlled. Continue current medications.  - CMP14+EGFR  2. Abnormal glucose Comments: HgbA1c is stable. No current medications.  - Hemoglobin A1c - CMP14+EGFR  3. Vitamin D deficiency Will check vitamin D level and supplement as needed.    Also encouraged to spend 15 minutes in the sun daily.  - VITAMIN D 25 Hydroxy (Vit-D Deficiency, Fractures)  4. Chronic bilateral low back pain without sciatica  Comments: Has seen Dr. Christella Noa who would like to fuse his back but he can not do that right now, he plans to go to a Chiropractor  5. History of COVID-19 Comments: he had a postive and negative test but still isolated in December     Patient was given opportunity to ask questions. Patient verbalized understanding of the plan and was able to repeat key elements of the plan. All questions were answered to their satisfaction.  [name], FNP   I, [name], FNP, have reviewed all documentation for this visit. The documentation on 02/20/21 for the exam, diagnosis, procedures, and orders are all accurate and complete.   IF YOU HAVE BEEN REFERRED TO A SPECIALIST, IT MAY TAKE 1-2 WEEKS TO SCHEDULE/PROCESS THE REFERRAL. IF YOU HAVE NOT HEARD FROM US/SPECIALIST IN TWO WEEKS, PLEASE GIVE [place] A CALL AT [phone].   THE PATIENT IS ENCOURAGED TO PRACTICE SOCIAL DISTANCING DUE TO THE COVID-19 PANDEMIC.

## 2021-02-21 LAB — CMP14+EGFR
ALT: 21 IU/L (ref 0–44)
AST: 30 IU/L (ref 0–40)
Albumin/Globulin Ratio: 1.9 (ref 1.2–2.2)
Albumin: 4.6 g/dL (ref 3.7–4.7)
Alkaline Phosphatase: 93 IU/L (ref 44–121)
BUN/Creatinine Ratio: 13 (ref 10–24)
BUN: 18 mg/dL (ref 8–27)
Bilirubin Total: 0.5 mg/dL (ref 0.0–1.2)
CO2: 25 mmol/L (ref 20–29)
Calcium: 9.5 mg/dL (ref 8.6–10.2)
Chloride: 101 mmol/L (ref 96–106)
Creatinine, Ser: 1.4 mg/dL — ABNORMAL HIGH (ref 0.76–1.27)
Globulin, Total: 2.4 g/dL (ref 1.5–4.5)
Glucose: 91 mg/dL (ref 70–99)
Potassium: 3.8 mmol/L (ref 3.5–5.2)
Sodium: 142 mmol/L (ref 134–144)
Total Protein: 7 g/dL (ref 6.0–8.5)
eGFR: 54 mL/min/{1.73_m2} — ABNORMAL LOW (ref >59)

## 2021-02-21 LAB — HEMOGLOBIN A1C
Est. average glucose Bld gHb Est-mCnc: 126 mg/dL
Hgb A1c MFr Bld: 6 % — ABNORMAL HIGH (ref 4.8–5.6)

## 2021-02-21 LAB — VITAMIN D 25 HYDROXY (VIT D DEFICIENCY, FRACTURES): Vit D, 25-Hydroxy: 70.1 ng/mL (ref 30.0–100.0)

## 2021-04-07 ENCOUNTER — Encounter: Payer: Self-pay | Admitting: Cardiovascular Disease

## 2021-04-07 ENCOUNTER — Ambulatory Visit (INDEPENDENT_AMBULATORY_CARE_PROVIDER_SITE_OTHER): Payer: Medicare HMO | Admitting: Cardiovascular Disease

## 2021-04-07 ENCOUNTER — Other Ambulatory Visit: Payer: Self-pay

## 2021-04-07 VITALS — BP 110/78 | HR 70 | Ht 71.0 in | Wt 209.2 lb

## 2021-04-07 DIAGNOSIS — I495 Sick sinus syndrome: Secondary | ICD-10-CM | POA: Diagnosis not present

## 2021-04-07 DIAGNOSIS — I10 Essential (primary) hypertension: Secondary | ICD-10-CM

## 2021-04-07 DIAGNOSIS — I2510 Atherosclerotic heart disease of native coronary artery without angina pectoris: Secondary | ICD-10-CM

## 2021-04-07 DIAGNOSIS — I719 Aortic aneurysm of unspecified site, without rupture: Secondary | ICD-10-CM

## 2021-04-07 DIAGNOSIS — N1831 Chronic kidney disease, stage 3a: Secondary | ICD-10-CM | POA: Diagnosis not present

## 2021-04-07 DIAGNOSIS — Z950 Presence of cardiac pacemaker: Secondary | ICD-10-CM

## 2021-04-07 NOTE — Patient Instructions (Addendum)
Medication Instructions:  No changes *If you need a refill on your cardiac medications before your next appointment, please call your pharmacy*   Lab Work: None ordered If you have labs (blood work) drawn today and your tests are completely normal, you will receive your results only by: MyChart Message (if you have MyChart) OR A paper copy in the mail If you have any lab test that is abnormal or we need to change your treatment, we will call you to review the results.   Testing/Procedures: None ordered   Follow-Up: At [facility], you and your health needs are our priority.  As part of our continuing mission to provide you with exceptional heart care, we have created designated Provider Care Teams.  These Care Teams include your primary Cardiologist (physician) and Advanced Practice Providers (APPs -  Physician Assistants and Nurse Practitioners) who all work together to provide you with the care you need, when you need it.  We recommend signing up for the patient portal called "MyChart".  Sign up information is provided on this After Visit Summary.  MyChart is used to connect with patients for Virtual Visits (Telemedicine).  Patients are able to view lab/test results, encounter notes, upcoming appointments, etc.  Non-urgent messages can be sent to your provider as well.   To learn more about what you can do with MyChart, go to [email or web address].    Your next appointment:   12 month(s)  The format for your next appointment:   In Person  Provider:   Dr. Croitoru   

## 2021-04-09 ENCOUNTER — Encounter: Payer: Self-pay | Admitting: Cardiovascular Disease

## 2021-04-09 NOTE — Progress Notes (Signed)
Patient ID: Aaron Snyder, male   DOB: 1951/01/10, 71 y.o.   MRN: [id number] ?  ? ?Cardiology Office Note   ? ?Date:  04/09/2021  ? ?ID:  Aaron Snyder, DOB Jun 12, 1950, MRN [id number] ? ?PCP:  [name], FNP  ?Cardiologist:   Sanda Klein, MD  ? ?Chief Complaint  ?Patient presents with  ? Pacemaker Check  ? ? ?History of Present Illness:  ?Aaron Snyder is a 71 y.o. male who returns for follow-up on his pacemaker, implanted in 2013 for syncope related to sinus node dysfunction with pauses.  He has had fatigue related to chronotropic incompetence, well compensated by adjustments in his rate response sensor. ? ?For the most part he feels well.  His activity has been a little more restricted due to orthopedic problems, especially with his back and left hip.  He denies exertional dyspnea, orthopnea, PND, edema or angina at rest or with activity.  He has not been aware of palpitations (in the past brief sensations of "burning" in the midsternal area correlated with PACs).  He has not had dizziness at rest or syncope, but he has experienced peculiar dizziness following activity.  If he is walking outside and he stops abruptly he will feel dizzy for a minute or 2.  When exercising at the gym or at home this does not happen, may be because he always cools down slowly. ? ?This rhythm is virtually always atrial paced, ventricular sensed.  This is the presenting rhythm today.  AV delay just slightly prolonged at 224 ms.  He has an old left anterior fascicular block.  He is not pacemaker dependent.  He has very rare brief episodes of nonsustained VT.  Lead parameters are excellent and estimated generator longevity is about 5 years. ? ?In late 2016, he noticed a reduction in exercise tolerance and his nuclear perfusion study appeared to show a large, new inferior wall scar. Coronary angiography actually showed very minor coronary atherosclerotic lesions (max stenosis 25%) as well as normal left ventricular regional  and global systolic function.  We brought him back for a treadmill stress test in October 2017 and adjusted his pacemaker sensor settings.  This led to a remarkable improvement in exercise capacity. ? ?He was worried about an abnormality detected on the chest x-ray, but chest CT showed that this was actually projection of a chronic benign rib abnormality.  He does have some emphysema changes in his lungs.  He quit smoking 20 years ago.  Incidental note was made of minor aneurysmal dilatation of the distal aortic arch and descending thoracic aorta with diameter of 3.8-3.9 cm. ? ?Coronary angiography has shown no progression of disease from 2005 to 2013 to 2016. Has a a history of head injury and cervical spine problems, GERD, pneumonia 2016.  ? ? ? ?Past Medical History:  ?Diagnosis Date  ? Coronary artery disease   ? Dysphagia   ? Head injury   ? Hypertension   ? Pacemaker generator end of life 12/18/2011  ? Medtronic  ? Presence of permanent cardiac pacemaker 02/01/2003  ? Medtronic  ? Syncope   ? ? ?Past Surgical History:  ?Procedure Laterality Date  ? CARDIAC CATHETERIZATION N/A 12/26/2014  ? Procedure: Left Heart Cath and Coronary Angiography;  Surgeon: [name], MD;  Location: [place] [facility];  Service: Cardiovascular;  Laterality: N/A;  ? ESOPHAGOGASTRODUODENOSCOPY    ? [place]  ? LEFT AND RIGHT HEART CATHETERIZATION WITH CORONARY ANGIOGRAM N/A 12/17/2011  ? Procedure: LEFT  AND RIGHT HEART CATHETERIZATION WITH CORONARY ANGIOGRAM;  Surgeon: [name], MD;  Location: [facility];  Service: Cardiovascular;  Laterality: N/A;  ? LOOP RECORDER IMPLANT/EXPLANT  2005  ? NM MYOCAR PERF WALL MOTION  01/2011  ? Negative  ? PACEMAKER GENERATOR CHANGE  12/18/2011  ? Medtronic  ? PACEMAKER INSERTION  2005  ? Medtronic  ? PERMANENT PACEMAKER GENERATOR CHANGE N/A 12/18/2011  ? Procedure: PERMANENT PACEMAKER GENERATOR CHANGE;  Surgeon: [name], MD;  Location: [facility] CATH [facility];  Service: Cardiovascular;  Laterality:  N/A;  ? TOTAL SHOULDER ARTHROPLASTY    ? ? ?Outpatient Medications Prior to Visit  ?Medication Sig Dispense Refill  ? amLODipine (NORVASC) 10 MG tablet Take 1 tablet (10 mg total) by mouth daily. 90 tablet 1  ? aspirin EC 81 MG tablet Take 81 mg by mouth daily.    ? Candesartan Cilexetil-HCTZ 32-25 MG TABS TAKE 1 TABLET BY MOUTH EVERY DAY 90 tablet 1  ? cholecalciferol (VITAMIN D3) 25 MCG (1000 UNIT) tablet Take 1,000 Units by mouth daily.    ? loratadine (CLARITIN) 10 MG tablet Take 10 mg by mouth daily.    ? Multiple Vitamins-Minerals (MULTIVITAMIN WITH MINERALS) tablet Take 1 tablet by mouth daily.    ? TURMERIC PO Take 1 tablet by mouth daily.    ? vitamin C (ASCORBIC ACID) 250 MG tablet Take 250 mg by mouth daily.    ? Vitamin D, Ergocalciferol, (DRISDOL) 1.25 MG (50000 UNIT) CAPS capsule TAKE 1 CAPSULE (50,000 UNITS TOTAL) BY MOUTH EVERY 7 (SEVEN) DAYS. 12 capsule 0  ? Zinc 50 MG CAPS Take 1 capsule by mouth daily.    ? hydrOXYzine (VISTARIL) 25 MG capsule Take 1 capsule (25 mg total) by mouth 3 (three) times daily as needed. 30 capsule 2  ? ipratropium (ATROVENT) 0.06 % nasal spray Place 2 sprays into the nose 3 (three) times daily. (Patient not taking: No sig reported) 15 mL 2  ? meclizine (ANTIVERT) 12.5 MG tablet Take 1 tablet (12.5 mg total) by mouth 3 (three) times daily as needed for dizziness. 30 tablet 0  ? prednisoLONE acetate (PRED FORTE) 1 % ophthalmic suspension Place 1 drop into the left eye 4 (four) times daily. (Patient not taking: Reported on 02/15/2020) 5 mL 1  ? ?No facility-administered medications prior to visit.  ?  ? ?Allergies:   Apple juice, Carrot [daucus carota], Iodinated contrast media, Iohexol, Oat, Soy allergy, Wheat, Almond meal, and Cow's milk [lac bovis]  ? ?Social History  ? ?Socioeconomic History  ? Marital status: Widowed  ?  Spouse name: Not on file  ? Number of children: Not on file  ? Years of education: Not on file  ? Highest education level: Not on file  ?Occupational  History  ? Occupation: Disabled  ?  Employer: UNEMPLOYED  ?Tobacco Use  ? Smoking status: Former  ?  Packs/day: 1.00  ?  Years: 15.00  ?  Pack years: 15.00  ?  Types: Cigarettes  ?  Quit date: 03/15/1996  ?  Years since quitting: 25.0  ? Smokeless tobacco: Never  ?Vaping Use  ? Vaping Use: Never used  ?Substance and Sexual Activity  ? Alcohol use: No  ? Drug use: Not Currently  ?  Types: Marijuana  ? Sexual activity: Not Currently  ?Other Topics Concern  ? Not on file  ?Social History Narrative  ? Not on file  ? ?Social Determinants of Health  ? ?Financial Resource Strain: Low Risk   ?  Difficulty of Paying Living Expenses: Not hard at all  ?Food Insecurity: No Food Insecurity  ? Worried About Charity fundraiser in the Last Year: Never true  ? Ran Out of Food in the Last Year: Never true  ?Transportation Needs: No Transportation Needs  ? Lack of Transportation (Medical): No  ? Lack of Transportation (Non-Medical): No  ?Physical Activity: Sufficiently Active  ? Days of Exercise per Week: 5 days  ? Minutes of Exercise per Session: 90 min  ?Stress: Stress Concern Present  ? Feeling of Stress : Rather much  ?Social Connections: Moderately Isolated  ? Frequency of Communication with Friends and Family: More than three times a week  ? Frequency of Social Gatherings with Friends and Family: Not on file  ? Attends Religious Services: 1 to 4 times per year  ? Active Member of Clubs or Organizations: No  ? Attends Archivist Meetings: Not on file  ? Marital Status: Widowed  ?  ? ? ?ROS:   ?Please see the history of present illness.    ?ROS ?All other systems are reviewed and are negative. ? ? ?PHYSICAL EXAM:   ?VS:  BP 110/78   Pulse 70   Ht '5\' 11"'$  (1.803 m)   Wt 209 lb 3.2 oz (94.9 kg)   SpO2 97%   BMI 29.18 kg/m?    ? ? ? ?General: Alert, oriented x3, no distress, appears younger than stated age.  Healthy left subclavian pacemaker site. ?Head: no evidence of trauma, PERRL, EOMI, no exophtalmos or lid lag, no  myxedema, no xanthelasma; normal ears, nose and oropharynx ?Neck: normal jugular venous pulsations and no hepatojugular reflux; brisk carotid pulses without delay and no carotid bruits ?Chest: clear to auscu

## 2021-04-17 ENCOUNTER — Ambulatory Visit (INDEPENDENT_AMBULATORY_CARE_PROVIDER_SITE_OTHER): Payer: Medicare HMO

## 2021-04-17 DIAGNOSIS — I495 Sick sinus syndrome: Secondary | ICD-10-CM

## 2021-04-21 LAB — CUP PACEART REMOTE DEVICE CHECK
Battery Impedance: 1231 Ohm
Battery Remaining Longevity: 60 mo
Battery Voltage: 2.76 V
Brady Statistic AP VP Percent: 0 %
Brady Statistic AP VS Percent: 97 %
Brady Statistic AS VP Percent: 0 %
Brady Statistic AS VS Percent: 3 %
Date Time Interrogation Session: 20230323110657
Implantable Lead Implant Date: 20050106
Implantable Lead Implant Date: 20131122
Implantable Lead Location: 753859
Implantable Lead Location: 753860
Implantable Lead Model: 5092
Implantable Lead Model: 5594
Implantable Pulse Generator Implant Date: 20131122
Lead Channel Impedance Value: 438 Ohm
Lead Channel Impedance Value: 608 Ohm
Lead Channel Pacing Threshold Amplitude: 0.5 V
Lead Channel Pacing Threshold Amplitude: 0.5 V
Lead Channel Pacing Threshold Pulse Width: 0.4 ms
Lead Channel Pacing Threshold Pulse Width: 0.4 ms
Lead Channel Setting Pacing Amplitude: 1.5 V
Lead Channel Setting Pacing Amplitude: 2 V
Lead Channel Setting Pacing Pulse Width: 0.4 ms
Lead Channel Setting Sensing Sensitivity: 2.8 mV

## 2021-04-28 NOTE — Progress Notes (Signed)
Remote pacemaker transmission.   

## 2021-05-27 DIAGNOSIS — I129 Hypertensive chronic kidney disease with stage 1 through stage 4 chronic kidney disease, or unspecified chronic kidney disease: Secondary | ICD-10-CM | POA: Diagnosis not present

## 2021-05-27 DIAGNOSIS — N183 Chronic kidney disease, stage 3 unspecified: Secondary | ICD-10-CM | POA: Diagnosis not present

## 2021-05-27 DIAGNOSIS — R5383 Other fatigue: Secondary | ICD-10-CM | POA: Diagnosis not present

## 2021-05-27 DIAGNOSIS — N189 Chronic kidney disease, unspecified: Secondary | ICD-10-CM | POA: Diagnosis not present

## 2021-05-27 DIAGNOSIS — Z6830 Body mass index (BMI) 30.0-30.9, adult: Secondary | ICD-10-CM | POA: Diagnosis not present

## 2021-07-17 ENCOUNTER — Ambulatory Visit (INDEPENDENT_AMBULATORY_CARE_PROVIDER_SITE_OTHER): Payer: Medicare HMO

## 2021-07-17 DIAGNOSIS — R21 Rash and other nonspecific skin eruption: Secondary | ICD-10-CM | POA: Diagnosis not present

## 2021-07-17 DIAGNOSIS — L299 Pruritus, unspecified: Secondary | ICD-10-CM | POA: Diagnosis not present

## 2021-07-17 DIAGNOSIS — L509 Urticaria, unspecified: Secondary | ICD-10-CM | POA: Diagnosis not present

## 2021-07-17 DIAGNOSIS — I495 Sick sinus syndrome: Secondary | ICD-10-CM

## 2021-07-21 LAB — CUP PACEART REMOTE DEVICE CHECK
Battery Impedance: 1427 Ohm
Battery Remaining Longevity: 54 mo
Battery Voltage: 2.76 V
Brady Statistic AP VP Percent: 0 %
Brady Statistic AP VS Percent: 97 %
Brady Statistic AS VP Percent: 0 %
Brady Statistic AS VS Percent: 2 %
Date Time Interrogation Session: 20230622123540
Implantable Lead Implant Date: 20050106
Implantable Lead Implant Date: 20131122
Implantable Lead Location: 753859
Implantable Lead Location: 753860
Implantable Lead Model: 5092
Implantable Lead Model: 5594
Implantable Pulse Generator Implant Date: 20131122
Lead Channel Impedance Value: 438 Ohm
Lead Channel Impedance Value: 587 Ohm
Lead Channel Pacing Threshold Amplitude: 0.5 V
Lead Channel Pacing Threshold Amplitude: 0.625 V
Lead Channel Pacing Threshold Pulse Width: 0.4 ms
Lead Channel Pacing Threshold Pulse Width: 0.4 ms
Lead Channel Setting Pacing Amplitude: 1.5 V
Lead Channel Setting Pacing Amplitude: 2 V
Lead Channel Setting Pacing Pulse Width: 0.4 ms
Lead Channel Setting Sensing Sensitivity: 2.8 mV

## 2021-07-25 NOTE — Progress Notes (Signed)
Remote pacemaker transmission.   

## 2021-08-04 ENCOUNTER — Other Ambulatory Visit: Payer: Self-pay | Admitting: Nurse Practitioner

## 2021-08-04 DIAGNOSIS — I10 Essential (primary) hypertension: Secondary | ICD-10-CM

## 2021-08-06 ENCOUNTER — Other Ambulatory Visit: Payer: Self-pay | Admitting: Nurse Practitioner

## 2021-08-06 DIAGNOSIS — I10 Essential (primary) hypertension: Secondary | ICD-10-CM

## 2021-08-21 ENCOUNTER — Ambulatory Visit (INDEPENDENT_AMBULATORY_CARE_PROVIDER_SITE_OTHER): Payer: Medicare HMO | Admitting: Nurse Practitioner

## 2021-08-21 ENCOUNTER — Encounter: Payer: Self-pay | Admitting: Nurse Practitioner

## 2021-08-21 VITALS — BP 112/62 | HR 72 | Temp 98.5°F | Ht 71.0 in | Wt 201.0 lb

## 2021-08-21 DIAGNOSIS — Z125 Encounter for screening for malignant neoplasm of prostate: Secondary | ICD-10-CM | POA: Diagnosis not present

## 2021-08-21 DIAGNOSIS — R238 Other skin changes: Secondary | ICD-10-CM | POA: Diagnosis not present

## 2021-08-21 DIAGNOSIS — Z0000 Encounter for general adult medical examination without abnormal findings: Secondary | ICD-10-CM

## 2021-08-21 DIAGNOSIS — H6123 Impacted cerumen, bilateral: Secondary | ICD-10-CM | POA: Diagnosis not present

## 2021-08-21 DIAGNOSIS — D229 Melanocytic nevi, unspecified: Secondary | ICD-10-CM

## 2021-08-21 DIAGNOSIS — R7309 Other abnormal glucose: Secondary | ICD-10-CM | POA: Diagnosis not present

## 2021-08-21 DIAGNOSIS — N183 Chronic kidney disease, stage 3 unspecified: Secondary | ICD-10-CM

## 2021-08-21 DIAGNOSIS — R5382 Chronic fatigue, unspecified: Secondary | ICD-10-CM | POA: Diagnosis not present

## 2021-08-21 DIAGNOSIS — Z79899 Other long term (current) drug therapy: Secondary | ICD-10-CM | POA: Diagnosis not present

## 2021-08-21 DIAGNOSIS — I129 Hypertensive chronic kidney disease with stage 1 through stage 4 chronic kidney disease, or unspecified chronic kidney disease: Secondary | ICD-10-CM | POA: Diagnosis not present

## 2021-08-21 DIAGNOSIS — E559 Vitamin D deficiency, unspecified: Secondary | ICD-10-CM

## 2021-08-21 LAB — POCT URINALYSIS DIPSTICK
Blood, UA: NEGATIVE
Glucose, UA: NEGATIVE
Nitrite, UA: NEGATIVE
Protein, UA: POSITIVE — AB
Spec Grav, UA: 1.02 (ref 1.010–1.025)
Urobilinogen, UA: 0.2 E.U./dL
pH, UA: 7.5 (ref 5.0–8.0)

## 2021-08-21 MED ORDER — VALACYCLOVIR HCL 500 MG PO TABS: 500.0000 mg | ORAL_TABLET | Freq: Two times a day (BID) | ORAL | 0 refills | Status: AC

## 2021-08-21 NOTE — Progress Notes (Signed)
I,Tianna Badgett,acting as a Education administrator for [employer], FNP.,have documented all relevant documentation on the behalf of [name], FNP,as directed by  [name], FNP while in the presence of [name], [place].   Subjective:     Patient ID: Aaron Snyder , male    DOB: 08-19-1950 , 71 y.o.   MRN: [id number]   Chief Complaint  Patient presents with   Annual Exam    HPI  Patient here for HM.  TDAP- available with TrasnAct[employer] - will hold off on tdap due to vesicular type rash  BP Readings from Last 3 Encounters: 08/21/21 : 112/62 04/07/21 : 110/78 02/20/21 : 130/80  Wt Readings from Last 3 Encounters: 08/21/21 : 201 lb (91.2 kg) 04/07/21 : 209 lb 3.2 oz (94.9 kg) 02/20/21 : 212 lb (96.2 kg)  He is only eating 2 times a day. He also walks about 2-3 miles a day. Reports he has had only 3 hours sleep due to caring for his mother. He has not had any help for the last 1.5 months.        Past Medical History:  Diagnosis Date   Coronary artery disease    Dysphagia    Head injury    Hypertension    Pacemaker generator end of life 12/18/2011   Medtronic   Presence of permanent cardiac pacemaker 02/01/2003   Medtronic   Syncope      Family History  Problem Relation Age of Onset   Diabetes Father    Hypertension Father      Current Outpatient Medications:    amLODipine (NORVASC) 10 MG tablet, TAKE 1 TABLET BY MOUTH EVERY DAY, Disp: 90 tablet, Rfl: 1   aspirin EC 81 MG tablet, Take 81 mg by mouth daily., Disp: , Rfl:    Candesartan Cilexetil-HCTZ 32-25 MG TABS, TAKE 1 TABLET BY MOUTH EVERY DAY, Disp: 90 tablet, Rfl: 1   cholecalciferol (VITAMIN D3) 25 MCG (1000 UNIT) tablet, Take 1,000 Units by mouth daily., Disp: , Rfl:    loratadine (CLARITIN) 10 MG tablet, Take 10 mg by mouth daily., Disp: , Rfl:    Multiple Vitamins-Minerals (MULTIVITAMIN WITH MINERALS) tablet, Take 1 tablet by mouth daily., Disp: , Rfl:    TURMERIC PO, Take 1 tablet by mouth daily., Disp: ,  Rfl:    valACYclovir (VALTREX) 500 MG tablet, Take 1 tablet (500 mg total) by mouth 2 (two) times daily for 5 days., Disp: 10 tablet, Rfl: 0   vitamin C (ASCORBIC ACID) 250 MG tablet, Take 250 mg by mouth daily., Disp: , Rfl:    Vitamin D, Ergocalciferol, (DRISDOL) 1.25 MG (50000 UNIT) CAPS capsule, TAKE 1 CAPSULE (50,000 UNITS TOTAL) BY MOUTH EVERY 7 (SEVEN) DAYS., Disp: 12 capsule, Rfl: 0   Zinc 50 MG CAPS, Take 1 capsule by mouth daily., Disp: , Rfl:    Allergies  Allergen Reactions   Apple Juice Anaphylaxis   Carrot [Daucus Carota] Anaphylaxis   Iodinated Contrast Media Shortness Of Breath and Swelling    PER PATIENT BREAK THROUGH REACTION WITH PRE MEDS-2016-----Can have contrast as long as he has a 13 prep   Iohexol Shortness Of Breath and Swelling    Pt requires 13hr premeds.   Oat Anaphylaxis   Soy Allergy Anaphylaxis   Wheat Anaphylaxis   Almond Meal Itching   Cow's Milk [Milk (Cow)]     Hives, and upset stomach      Men's preventive visit. Patient Health Questionnaire (PHQ-2) is  Flowsheet Row Clinical Support from  02/20/2021 in [facility]  PHQ-2 Total Score 0      Patient is on a regular diet; eats 2 times a day about 1300 calories. He will eat a healthy choice meal in the evening. Marital status: Widowed. Relevant history for alcohol use is:  Social History   Substance and Sexual Activity  Alcohol Use No   Relevant history for tobacco use is:  Social History   Tobacco Use  Smoking Status Former   Packs/day: 1.00   Years: 15.00   Total pack years: 15.00   Types: Cigarettes   Quit date: 03/15/1996   Years since quitting: 25.4  Smokeless Tobacco Never  .   Review of Systems  Constitutional:  Positive for fatigue.  HENT: Negative.    Eyes: Negative.   Respiratory: Negative.    Cardiovascular: Negative.  Negative for chest pain, palpitations and leg swelling.  Gastrointestinal: Negative.   Endocrine: Negative.   Genitourinary:  Negative.   Musculoskeletal:  Positive for back pain.  Skin:  Positive for rash (back and left hand has one area feels like a bite).  Allergic/Immunologic: Negative.   Neurological: Negative.   Hematological: Negative.   Psychiatric/Behavioral: Negative.       Today's Vitals   08/21/21 0842  BP: 112/62  Pulse: 72  Temp: 98.5 F (36.9 C)  TempSrc: Oral  Weight: 201 lb (91.2 kg)  Height: '5\' 11"'  (1.803 m)   Body mass index is 28.03 kg/m.   Objective:  Physical Exam Vitals reviewed.  Constitutional:      General: He is not in acute distress.    Appearance: Normal appearance. He is not ill-appearing.  HENT:     Head: Normocephalic and atraumatic.     Right Ear: External ear normal. There is impacted cerumen (Excessive cerumen to canal).     Left Ear: External ear normal. There is impacted cerumen (Excessive cerumen to canal).     Nose: Nose normal.     Mouth/Throat:     Mouth: Mucous membranes are moist.  Eyes:     Extraocular Movements: Extraocular movements intact.     Conjunctiva/sclera: Conjunctivae normal.     Pupils: Pupils are equal, round, and reactive to light.  Cardiovascular:     Rate and Rhythm: Normal rate and regular rhythm.     Pulses: Normal pulses.     Heart sounds: Normal heart sounds. No murmur heard. Pulmonary:     Effort: Pulmonary effort is normal. No respiratory distress.     Breath sounds: Normal breath sounds. No wheezing.  Abdominal:     General: Abdomen is flat. Bowel sounds are normal. There is no distension.     Palpations: Abdomen is soft.     Tenderness: There is no abdominal tenderness.  Genitourinary:    Comments: Will check PSA no current symptoms Musculoskeletal:        General: No swelling or tenderness. Normal range of motion.     Cervical back: Normal range of motion and neck supple. No muscular tenderness.  Skin:    General: Skin is warm and dry.     Capillary Refill: Capillary refill takes less than 2 seconds.     Findings:  Lesion (upper back has 2 irregular shaped dark brown lesions) and rash (righ upper lateral back with vesicular type rash, one small area to anterior hand with slight erythema.) present.  Neurological:     General: No focal deficit present.     Mental Status: He is alert and oriented  to person, place, and time.     Cranial Nerves: No cranial nerve deficit.     Motor: No weakness.  Psychiatric:        Mood and Affect: Mood normal.        Behavior: Behavior normal.        Thought Content: Thought content normal.        Judgment: Judgment normal.         Assessment And Plan:    1. Encounter for general adult medical examination w/o abnormal findings Behavior modifications discussed and diet history reviewed.   Pt will continue to exercise regularly and modify diet with low GI, plant based foods and decrease intake of processed foods.  Recommend intake of daily multivitamin, Vitamin D, and calcium.  Recommend mammogram and colonoscopy for preventive screenings, as well as recommend immunizations that include influenza, TDAP, and Shingles (up to date)  2. Encounter for prostate cancer screening - PSA  3. Benign hypertension with CKD (chronic kidney disease) stage III (HCC) Comments: Blood pressure is well controlled, continue current medications - CMP14+EGFR - Lipid panel - POCT Urinalysis Dipstick (81002) - Microalbumin / Creatinine Urine Ratio  4. Abnormal glucose Comments: Stable, diet controlled.  - Hemoglobin A1c - CMP14+EGFR  5. Vitamin D deficiency Will check vitamin D level and supplement as needed.    Also encouraged to spend 15 minutes in the sun daily.  - VITAMIN D 25 Hydroxy (Vit-D Deficiency, Fractures)  6. Chronic fatigue Comments: He has had a full work up with cardiology and metabolic labs to check for causes. Offered to refer to psychiatry for further evaluation, declines at this time  7. Excessive cerumen in both ear canals Removed soft dark cerumen from  both ears with lighted curette without difficulty  8. Vesicular rash Comments: Posterior right back area with multiple areas of rash. Will treat as shingles with valtrex. If not better return call to office - valACYclovir (VALTREX) 500 MG tablet; Take 1 tablet (500 mg total) by mouth 2 (two) times daily for 5 days.  Dispense: 10 tablet; Refill: 0  9. Atypical nevi Comments: Irregular shaped nevi to upper back. Will refer to Dermatology for further evaluation - Ambulatory referral to Dermatology  10. Other long term (current) drug therapy - CBC     Patient was given opportunity to ask questions. Patient verbalized understanding of the plan and was able to repeat key elements of the plan. All questions were answered to their satisfaction.   [name], FNP   I, [name], FNP, have reviewed all documentation for this visit. The documentation on 08/21/21 for the exam, diagnosis, procedures, and orders are all accurate and complete.   THE PATIENT IS ENCOURAGED TO PRACTICE SOCIAL DISTANCING DUE TO THE COVID-19 PANDEMIC.

## 2021-08-21 NOTE — Patient Instructions (Addendum)
Health Maintenance, Male Adopting a healthy lifestyle and getting preventive care are important in promoting health and wellness. Ask your health care provider about: The right schedule for you to have regular tests and exams. Things you can do on your own to prevent diseases and keep yourself healthy. What should I know about diet, weight, and exercise? Eat a healthy diet  Eat a diet that includes plenty of vegetables, fruits, low-fat dairy products, and lean protein. Do not eat a lot of foods that are high in solid fats, added sugars, or sodium. Maintain a healthy weight Body mass index (BMI) is a measurement that can be used to identify possible weight problems. It estimates body fat based on height and weight. Your health care provider can help determine your BMI and help you achieve or maintain a healthy weight. Get regular exercise Get regular exercise. This is one of the most important things you can do for your health. Most adults should: Exercise for at least 150 minutes each week. The exercise should increase your heart rate and make you sweat (moderate-intensity exercise). Do strengthening exercises at least twice a week. This is in addition to the moderate-intensity exercise. Spend less time sitting. Even light physical activity can be beneficial. Watch cholesterol and blood lipids Have your blood tested for lipids and cholesterol at 72 years of age, then have this test every 5 years. You may need to have your cholesterol levels checked more often if: Your lipid or cholesterol levels are high. You are older than 72 years of age. You are at high risk for heart disease. What should I know about cancer screening? Many types of cancers can be detected early and may often be prevented. Depending on your health history and family history, you may need to have cancer screening at various ages. This may include screening for: Colorectal cancer. Prostate cancer. Skin cancer. Lung  cancer. What should I know about heart disease, diabetes, and high blood pressure? Blood pressure and heart disease High blood pressure causes heart disease and increases the risk of stroke. This is more likely to develop in people who have high blood pressure readings or are overweight. Talk with your health care provider about your target blood pressure readings. Have your blood pressure checked: Every 3-5 years if you are 18-39 years of age. Every year if you are 40 years old or older. If you are between the ages of 65 and 75 and are a current or former smoker, ask your health care provider if you should have a one-time screening for abdominal aortic aneurysm (AAA). Diabetes Have regular diabetes screenings. This checks your fasting blood sugar level. Have the screening done: Once every three years after age 45 if you are at a normal weight and have a low risk for diabetes. More often and at a younger age if you are overweight or have a high risk for diabetes. What should I know about preventing infection? Hepatitis B If you have a higher risk for hepatitis B, you should be screened for this virus. Talk with your health care provider to find out if you are at risk for hepatitis B infection. Hepatitis C Blood testing is recommended for: Everyone born from 1945 through 1965. Anyone with known risk factors for hepatitis C. Sexually transmitted infections (STIs) You should be screened each year for STIs, including gonorrhea and chlamydia, if: You are sexually active and are younger than 72 years of age. You are older than 72 years of age and your   health care provider tells you that you are at risk for this type of infection. Your sexual activity has changed since you were last screened, and you are at increased risk for chlamydia or gonorrhea. Ask your health care provider if you are at risk. Ask your health care provider about whether you are at high risk for HIV. Your health care provider  may recommend a prescription medicine to help prevent HIV infection. If you choose to take medicine to prevent HIV, you should first get tested for HIV. You should then be tested every 3 months for as long as you are taking the medicine. Follow these instructions at home: Alcohol use Do not drink alcohol if your health care provider tells you not to drink. If you drink alcohol: Limit how much you have to 0-2 drinks a day. Know how much alcohol is in your drink. In the [place] one drink equals one 12 oz bottle of beer (355 mL), one 5 oz glass of wine (148 mL), or one 1 oz glass of hard liquor (44 mL). Lifestyle Do not use any products that contain nicotine or tobacco. These products include cigarettes, chewing tobacco, and vaping devices, such as e-cigarettes. If you need help quitting, ask your health care provider. Do not use street drugs. Do not share needles. Ask your health care provider for help if you need support or information about quitting drugs. General instructions Schedule regular health, dental, and eye exams. Stay current with your vaccines. Tell your health care provider if: You often feel depressed. You have ever been abused or do not feel safe at home. Summary Adopting a healthy lifestyle and getting preventive care are important in promoting health and wellness. Follow your health care provider's instructions about healthy diet, exercising, and getting tested or screened for diseases. Follow your health care provider's instructions on monitoring your cholesterol and blood pressure. This information is not intended to replace advice given to you by your health care provider. Make sure you discuss any questions you have with your health care provider. Document Revised: 06/03/2020 Document Reviewed: 06/03/2020 Elsevier Patient Education  [place].   Tdap (Tetanus, Diphtheria, Pertussis) Vaccine: What You Need to Know 1. Why get vaccinated? Tdap vaccine can  prevent tetanus, diphtheria, and pertussis. Diphtheria and pertussis spread from person to person. Tetanus enters the body through cuts or wounds. TETANUS (T) causes painful stiffening of the muscles. Tetanus can lead to serious health problems, including being unable to open the mouth, having trouble swallowing and breathing, or death. DIPHTHERIA (D) can lead to difficulty breathing, heart failure, paralysis, or death. PERTUSSIS (aP), also known as "whooping cough," can cause uncontrollable, violent coughing that makes it hard to breathe, eat, or drink. Pertussis can be extremely serious especially in babies and young children, causing pneumonia, convulsions, brain damage, or death. In teens and adults, it can cause weight loss, loss of bladder control, passing out, and rib fractures from severe coughing. 2. Tdap vaccine Tdap is only for children 7 years and older, adolescents, and adults.  Adolescents should receive a single dose of Tdap, preferably at age 39 or 18 years. Pregnant people should get a dose of Tdap during every pregnancy, preferably during the early part of the third trimester, to help protect the newborn from pertussis. Infants are most at risk for severe, life-threatening complications from pertussis. Adults who have never received Tdap should get a dose of Tdap. Also, adults should receive a booster dose of either Tdap or Td (a  different vaccine that protects against tetanus and diphtheria but not pertussis) every 10 years, or after 5 years in the case of a severe or dirty wound or burn. Tdap may be given at the same time as other vaccines. 3. Talk with your health care provider Tell your vaccine provider if the person getting the vaccine: Has had an allergic reaction after a previous dose of any vaccine that protects against tetanus, diphtheria, or pertussis, or has any severe, life-threatening allergies Has had a coma, decreased level of consciousness, or prolonged seizures  within 7 days after a previous dose of any pertussis vaccine (DTP, DTaP, or Tdap) Has seizures or another nervous system problem Has ever had Guillain-Barr Syndrome (also called "GBS") Has had severe pain or swelling after a previous dose of any vaccine that protects against tetanus or diphtheria In some cases, your health care provider may decide to postpone Tdap vaccination until a future visit. People with minor illnesses, such as a cold, may be vaccinated. People who are moderately or severely ill should usually wait until they recover before getting Tdap vaccine.  Your health care provider can give you more information. 4. Risks of a vaccine reaction Pain, redness, or swelling where the shot was given, mild fever, headache, feeling tired, and nausea, vomiting, diarrhea, or stomachache sometimes happen after Tdap vaccination. People sometimes faint after medical procedures, including vaccination. Tell your provider if you feel dizzy or have vision changes or ringing in the ears.  As with any medicine, there is a very remote chance of a vaccine causing a severe allergic reaction, other serious injury, or death. 5. What if there is a serious problem? An allergic reaction could occur after the vaccinated person leaves the clinic. If you see signs of a severe allergic reaction (hives, swelling of the face and throat, difficulty breathing, a fast heartbeat, dizziness, or weakness), call 9-1-1 and get the person to the nearest hospital. For other signs that concern you, call your health care provider.  Adverse reactions should be reported to the Vaccine Adverse Event Reporting System (VAERS). Your health care provider will usually file this report, or you can do it yourself. Visit the VAERS website at www.vaers.SamedayNews.es or call 614-412-7178. VAERS is only for reporting reactions, and VAERS staff members do not give medical advice. 6. The National Vaccine Injury Compensation Program The [employer]  Vaccine Injury Compensation Program (VICP) is a federal program that was created to compensate people who may have been injured by certain vaccines. Claims regarding alleged injury or death due to vaccination have a time limit for filing, which may be as short as two years. Visit the VICP website at GoldCloset.com.ee or call (607)726-4718 to learn about the program and about filing a claim. 7. How can I learn more? Ask your health care provider. Call your local or state health department. Visit the website of the Food and Drug Administration (FDA) for vaccine package inserts and additional information at TraderRating.uy. Contact the Centers for Disease Control and Prevention (CDC): Call 5635470620 (1-800-CDC-INFO) or Visit CDC's website at http://hunter.com/. Source: CDC Vaccine Information Statement Tdap (Tetanus, Diphtheria, Pertussis) Vaccine (09/01/2019) This same material is available at http://www.wolf.info/ for no charge. This information is not intended to replace advice given to you by your health care provider. Make sure you discuss any questions you have with your health care provider. Document Revised: 12/11/2020 Document Reviewed: 10/14/2020 Elsevier Patient Education  [place].

## 2021-08-22 LAB — CBC
Hematocrit: 42.6 % (ref 37.5–51.0)
Hemoglobin: 14.6 g/dL (ref 13.0–17.7)
MCH: 29.9 pg (ref 26.6–33.0)
MCHC: 34.3 g/dL (ref 31.5–35.7)
MCV: 87 fL (ref 79–97)
Platelets: 188 10*3/uL (ref 150–450)
RBC: 4.89 x10E6/uL (ref 4.14–5.80)
RDW: 14 % (ref 11.6–15.4)
WBC: 6.1 10*3/uL (ref 3.4–10.8)

## 2021-08-22 LAB — CMP14+EGFR
ALT: 16 IU/L (ref 0–44)
AST: 30 IU/L (ref 0–40)
Albumin/Globulin Ratio: 2 (ref 1.2–2.2)
Albumin: 4.3 g/dL (ref 3.8–4.8)
Alkaline Phosphatase: 76 IU/L (ref 44–121)
BUN/Creatinine Ratio: 13 (ref 10–24)
BUN: 19 mg/dL (ref 8–27)
Bilirubin Total: 0.5 mg/dL (ref 0.0–1.2)
CO2: 26 mmol/L (ref 20–29)
Calcium: 9.7 mg/dL (ref 8.6–10.2)
Chloride: 102 mmol/L (ref 96–106)
Creatinine, Ser: 1.51 mg/dL — ABNORMAL HIGH (ref 0.76–1.27)
Globulin, Total: 2.2 g/dL (ref 1.5–4.5)
Glucose: 99 mg/dL (ref 70–99)
Potassium: 3.9 mmol/L (ref 3.5–5.2)
Sodium: 142 mmol/L (ref 134–144)
Total Protein: 6.5 g/dL (ref 6.0–8.5)
eGFR: 49 mL/min/{1.73_m2} — ABNORMAL LOW (ref >59)

## 2021-08-22 LAB — VITAMIN D 25 HYDROXY (VIT D DEFICIENCY, FRACTURES): Vit D, 25-Hydroxy: 74.8 ng/mL (ref 30.0–100.0)

## 2021-08-22 LAB — HEMOGLOBIN A1C
Est. average glucose Bld gHb Est-mCnc: 120 mg/dL
Hgb A1c MFr Bld: 5.8 % — ABNORMAL HIGH (ref 4.8–5.6)

## 2021-08-22 LAB — LIPID PANEL
Chol/HDL Ratio: 3 ratio (ref 0.0–5.0)
Cholesterol, Total: 172 mg/dL (ref 100–199)
HDL: 57 mg/dL (ref >39)
LDL Chol Calc (NIH): 97 mg/dL (ref 0–99)
Triglycerides: 99 mg/dL (ref 0–149)
VLDL Cholesterol Cal: 18 mg/dL (ref 5–40)

## 2021-08-22 LAB — MICROALBUMIN / CREATININE URINE RATIO
Creatinine, Urine: 212.6 mg/dL
Microalb/Creat Ratio: 20 mg/g creat (ref 0–29)
Microalbumin, Urine: 42.4 ug/mL

## 2021-08-22 LAB — PSA: Prostate Specific Ag, Serum: 3.9 ng/mL (ref 0.0–4.0)

## 2021-09-26 DIAGNOSIS — M9901 Segmental and somatic dysfunction of cervical region: Secondary | ICD-10-CM | POA: Diagnosis not present

## 2021-09-26 DIAGNOSIS — M5442 Lumbago with sciatica, left side: Secondary | ICD-10-CM | POA: Diagnosis not present

## 2021-09-26 DIAGNOSIS — M5412 Radiculopathy, cervical region: Secondary | ICD-10-CM | POA: Diagnosis not present

## 2021-09-26 DIAGNOSIS — M5441 Lumbago with sciatica, right side: Secondary | ICD-10-CM | POA: Diagnosis not present

## 2021-10-03 DIAGNOSIS — M9901 Segmental and somatic dysfunction of cervical region: Secondary | ICD-10-CM | POA: Diagnosis not present

## 2021-10-03 DIAGNOSIS — M9903 Segmental and somatic dysfunction of lumbar region: Secondary | ICD-10-CM | POA: Diagnosis not present

## 2021-10-03 DIAGNOSIS — M9902 Segmental and somatic dysfunction of thoracic region: Secondary | ICD-10-CM | POA: Diagnosis not present

## 2021-10-06 DIAGNOSIS — M9903 Segmental and somatic dysfunction of lumbar region: Secondary | ICD-10-CM | POA: Diagnosis not present

## 2021-10-06 DIAGNOSIS — M9902 Segmental and somatic dysfunction of thoracic region: Secondary | ICD-10-CM | POA: Diagnosis not present

## 2021-10-06 DIAGNOSIS — M9901 Segmental and somatic dysfunction of cervical region: Secondary | ICD-10-CM | POA: Diagnosis not present

## 2021-10-08 DIAGNOSIS — M9901 Segmental and somatic dysfunction of cervical region: Secondary | ICD-10-CM | POA: Diagnosis not present

## 2021-10-08 DIAGNOSIS — M9902 Segmental and somatic dysfunction of thoracic region: Secondary | ICD-10-CM | POA: Diagnosis not present

## 2021-10-08 DIAGNOSIS — M9903 Segmental and somatic dysfunction of lumbar region: Secondary | ICD-10-CM | POA: Diagnosis not present

## 2021-10-10 DIAGNOSIS — M9902 Segmental and somatic dysfunction of thoracic region: Secondary | ICD-10-CM | POA: Diagnosis not present

## 2021-10-10 DIAGNOSIS — M9901 Segmental and somatic dysfunction of cervical region: Secondary | ICD-10-CM | POA: Diagnosis not present

## 2021-10-10 DIAGNOSIS — M9903 Segmental and somatic dysfunction of lumbar region: Secondary | ICD-10-CM | POA: Diagnosis not present

## 2021-10-13 DIAGNOSIS — M9902 Segmental and somatic dysfunction of thoracic region: Secondary | ICD-10-CM | POA: Diagnosis not present

## 2021-10-13 DIAGNOSIS — M9901 Segmental and somatic dysfunction of cervical region: Secondary | ICD-10-CM | POA: Diagnosis not present

## 2021-10-13 DIAGNOSIS — M9903 Segmental and somatic dysfunction of lumbar region: Secondary | ICD-10-CM | POA: Diagnosis not present

## 2021-10-15 DIAGNOSIS — M9901 Segmental and somatic dysfunction of cervical region: Secondary | ICD-10-CM | POA: Diagnosis not present

## 2021-10-15 DIAGNOSIS — M9903 Segmental and somatic dysfunction of lumbar region: Secondary | ICD-10-CM | POA: Diagnosis not present

## 2021-10-15 DIAGNOSIS — M9902 Segmental and somatic dysfunction of thoracic region: Secondary | ICD-10-CM | POA: Diagnosis not present

## 2021-10-16 ENCOUNTER — Ambulatory Visit (INDEPENDENT_AMBULATORY_CARE_PROVIDER_SITE_OTHER): Payer: Medicare HMO

## 2021-10-16 DIAGNOSIS — I495 Sick sinus syndrome: Secondary | ICD-10-CM | POA: Diagnosis not present

## 2021-10-17 DIAGNOSIS — M9903 Segmental and somatic dysfunction of lumbar region: Secondary | ICD-10-CM | POA: Diagnosis not present

## 2021-10-17 DIAGNOSIS — M9901 Segmental and somatic dysfunction of cervical region: Secondary | ICD-10-CM | POA: Diagnosis not present

## 2021-10-17 DIAGNOSIS — M9902 Segmental and somatic dysfunction of thoracic region: Secondary | ICD-10-CM | POA: Diagnosis not present

## 2021-10-17 LAB — CUP PACEART REMOTE DEVICE CHECK
Battery Impedance: 1538 Ohm
Battery Remaining Longevity: 51 mo
Battery Voltage: 2.76 V
Brady Statistic AP VP Percent: 0 %
Brady Statistic AP VS Percent: 97 %
Brady Statistic AS VP Percent: 0 %
Brady Statistic AS VS Percent: 3 %
Date Time Interrogation Session: 20230921110708
Implantable Lead Implant Date: 20050106
Implantable Lead Implant Date: 20131122
Implantable Lead Location: 753859
Implantable Lead Location: 753860
Implantable Lead Model: 5092
Implantable Lead Model: 5594
Implantable Pulse Generator Implant Date: 20131122
Lead Channel Impedance Value: 410 Ohm
Lead Channel Impedance Value: 582 Ohm
Lead Channel Pacing Threshold Amplitude: 0.5 V
Lead Channel Pacing Threshold Amplitude: 0.5 V
Lead Channel Pacing Threshold Pulse Width: 0.4 ms
Lead Channel Pacing Threshold Pulse Width: 0.4 ms
Lead Channel Setting Pacing Amplitude: 1.5 V
Lead Channel Setting Pacing Amplitude: 2 V
Lead Channel Setting Pacing Pulse Width: 0.4 ms
Lead Channel Setting Sensing Sensitivity: 2.8 mV

## 2021-10-20 DIAGNOSIS — M9901 Segmental and somatic dysfunction of cervical region: Secondary | ICD-10-CM | POA: Diagnosis not present

## 2021-10-20 DIAGNOSIS — M9902 Segmental and somatic dysfunction of thoracic region: Secondary | ICD-10-CM | POA: Diagnosis not present

## 2021-10-20 DIAGNOSIS — M9903 Segmental and somatic dysfunction of lumbar region: Secondary | ICD-10-CM | POA: Diagnosis not present

## 2021-10-22 DIAGNOSIS — M9902 Segmental and somatic dysfunction of thoracic region: Secondary | ICD-10-CM | POA: Diagnosis not present

## 2021-10-22 DIAGNOSIS — M9903 Segmental and somatic dysfunction of lumbar region: Secondary | ICD-10-CM | POA: Diagnosis not present

## 2021-10-22 DIAGNOSIS — M9901 Segmental and somatic dysfunction of cervical region: Secondary | ICD-10-CM | POA: Diagnosis not present

## 2021-10-24 DIAGNOSIS — M9903 Segmental and somatic dysfunction of lumbar region: Secondary | ICD-10-CM | POA: Diagnosis not present

## 2021-10-24 DIAGNOSIS — M9902 Segmental and somatic dysfunction of thoracic region: Secondary | ICD-10-CM | POA: Diagnosis not present

## 2021-10-24 DIAGNOSIS — M9901 Segmental and somatic dysfunction of cervical region: Secondary | ICD-10-CM | POA: Diagnosis not present

## 2021-10-27 DIAGNOSIS — M9901 Segmental and somatic dysfunction of cervical region: Secondary | ICD-10-CM | POA: Diagnosis not present

## 2021-10-27 DIAGNOSIS — M9903 Segmental and somatic dysfunction of lumbar region: Secondary | ICD-10-CM | POA: Diagnosis not present

## 2021-10-27 DIAGNOSIS — M9902 Segmental and somatic dysfunction of thoracic region: Secondary | ICD-10-CM | POA: Diagnosis not present

## 2021-10-27 DIAGNOSIS — M5412 Radiculopathy, cervical region: Secondary | ICD-10-CM | POA: Diagnosis not present

## 2021-10-27 DIAGNOSIS — M5441 Lumbago with sciatica, right side: Secondary | ICD-10-CM | POA: Diagnosis not present

## 2021-10-27 DIAGNOSIS — M5442 Lumbago with sciatica, left side: Secondary | ICD-10-CM | POA: Diagnosis not present

## 2021-10-27 NOTE — Progress Notes (Signed)
Remote pacemaker transmission.   

## 2021-10-29 DIAGNOSIS — M9903 Segmental and somatic dysfunction of lumbar region: Secondary | ICD-10-CM | POA: Diagnosis not present

## 2021-10-29 DIAGNOSIS — M9901 Segmental and somatic dysfunction of cervical region: Secondary | ICD-10-CM | POA: Diagnosis not present

## 2021-10-29 DIAGNOSIS — M9902 Segmental and somatic dysfunction of thoracic region: Secondary | ICD-10-CM | POA: Diagnosis not present

## 2021-11-03 DIAGNOSIS — M9903 Segmental and somatic dysfunction of lumbar region: Secondary | ICD-10-CM | POA: Diagnosis not present

## 2021-11-03 DIAGNOSIS — M9902 Segmental and somatic dysfunction of thoracic region: Secondary | ICD-10-CM | POA: Diagnosis not present

## 2021-11-03 DIAGNOSIS — M9901 Segmental and somatic dysfunction of cervical region: Secondary | ICD-10-CM | POA: Diagnosis not present

## 2021-11-05 DIAGNOSIS — M9902 Segmental and somatic dysfunction of thoracic region: Secondary | ICD-10-CM | POA: Diagnosis not present

## 2021-11-05 DIAGNOSIS — M9901 Segmental and somatic dysfunction of cervical region: Secondary | ICD-10-CM | POA: Diagnosis not present

## 2021-11-05 DIAGNOSIS — M9903 Segmental and somatic dysfunction of lumbar region: Secondary | ICD-10-CM | POA: Diagnosis not present

## 2021-11-12 DIAGNOSIS — M9902 Segmental and somatic dysfunction of thoracic region: Secondary | ICD-10-CM | POA: Diagnosis not present

## 2021-11-12 DIAGNOSIS — M9901 Segmental and somatic dysfunction of cervical region: Secondary | ICD-10-CM | POA: Diagnosis not present

## 2021-11-12 DIAGNOSIS — M9903 Segmental and somatic dysfunction of lumbar region: Secondary | ICD-10-CM | POA: Diagnosis not present

## 2021-11-14 DIAGNOSIS — M9902 Segmental and somatic dysfunction of thoracic region: Secondary | ICD-10-CM | POA: Diagnosis not present

## 2021-11-14 DIAGNOSIS — M9903 Segmental and somatic dysfunction of lumbar region: Secondary | ICD-10-CM | POA: Diagnosis not present

## 2021-11-14 DIAGNOSIS — M9901 Segmental and somatic dysfunction of cervical region: Secondary | ICD-10-CM | POA: Diagnosis not present

## 2021-11-19 DIAGNOSIS — M9902 Segmental and somatic dysfunction of thoracic region: Secondary | ICD-10-CM | POA: Diagnosis not present

## 2021-11-19 DIAGNOSIS — M9901 Segmental and somatic dysfunction of cervical region: Secondary | ICD-10-CM | POA: Diagnosis not present

## 2021-11-19 DIAGNOSIS — M5441 Lumbago with sciatica, right side: Secondary | ICD-10-CM | POA: Diagnosis not present

## 2021-11-19 DIAGNOSIS — M5442 Lumbago with sciatica, left side: Secondary | ICD-10-CM | POA: Diagnosis not present

## 2021-11-19 DIAGNOSIS — M9903 Segmental and somatic dysfunction of lumbar region: Secondary | ICD-10-CM | POA: Diagnosis not present

## 2021-11-19 DIAGNOSIS — M5412 Radiculopathy, cervical region: Secondary | ICD-10-CM | POA: Diagnosis not present

## 2021-11-21 DIAGNOSIS — M9902 Segmental and somatic dysfunction of thoracic region: Secondary | ICD-10-CM | POA: Diagnosis not present

## 2021-11-21 DIAGNOSIS — M5442 Lumbago with sciatica, left side: Secondary | ICD-10-CM | POA: Diagnosis not present

## 2021-11-21 DIAGNOSIS — M5412 Radiculopathy, cervical region: Secondary | ICD-10-CM | POA: Diagnosis not present

## 2021-11-21 DIAGNOSIS — M9903 Segmental and somatic dysfunction of lumbar region: Secondary | ICD-10-CM | POA: Diagnosis not present

## 2021-11-21 DIAGNOSIS — M9901 Segmental and somatic dysfunction of cervical region: Secondary | ICD-10-CM | POA: Diagnosis not present

## 2021-11-21 DIAGNOSIS — M5441 Lumbago with sciatica, right side: Secondary | ICD-10-CM | POA: Diagnosis not present

## 2021-11-24 DIAGNOSIS — M9901 Segmental and somatic dysfunction of cervical region: Secondary | ICD-10-CM | POA: Diagnosis not present

## 2021-11-24 DIAGNOSIS — M9902 Segmental and somatic dysfunction of thoracic region: Secondary | ICD-10-CM | POA: Diagnosis not present

## 2021-11-24 DIAGNOSIS — M9903 Segmental and somatic dysfunction of lumbar region: Secondary | ICD-10-CM | POA: Diagnosis not present

## 2021-11-27 DIAGNOSIS — M9901 Segmental and somatic dysfunction of cervical region: Secondary | ICD-10-CM | POA: Diagnosis not present

## 2021-11-27 DIAGNOSIS — M9903 Segmental and somatic dysfunction of lumbar region: Secondary | ICD-10-CM | POA: Diagnosis not present

## 2021-11-27 DIAGNOSIS — M9902 Segmental and somatic dysfunction of thoracic region: Secondary | ICD-10-CM | POA: Diagnosis not present

## 2021-12-03 DIAGNOSIS — M9903 Segmental and somatic dysfunction of lumbar region: Secondary | ICD-10-CM | POA: Diagnosis not present

## 2021-12-03 DIAGNOSIS — M9901 Segmental and somatic dysfunction of cervical region: Secondary | ICD-10-CM | POA: Diagnosis not present

## 2021-12-03 DIAGNOSIS — M9902 Segmental and somatic dysfunction of thoracic region: Secondary | ICD-10-CM | POA: Diagnosis not present

## 2021-12-06 DIAGNOSIS — M7651 Patellar tendinitis, right knee: Secondary | ICD-10-CM | POA: Diagnosis not present

## 2021-12-10 DIAGNOSIS — M9903 Segmental and somatic dysfunction of lumbar region: Secondary | ICD-10-CM | POA: Diagnosis not present

## 2021-12-10 DIAGNOSIS — M9901 Segmental and somatic dysfunction of cervical region: Secondary | ICD-10-CM | POA: Diagnosis not present

## 2021-12-10 DIAGNOSIS — M9902 Segmental and somatic dysfunction of thoracic region: Secondary | ICD-10-CM | POA: Diagnosis not present

## 2021-12-17 DIAGNOSIS — M9901 Segmental and somatic dysfunction of cervical region: Secondary | ICD-10-CM | POA: Diagnosis not present

## 2021-12-17 DIAGNOSIS — M9902 Segmental and somatic dysfunction of thoracic region: Secondary | ICD-10-CM | POA: Diagnosis not present

## 2021-12-17 DIAGNOSIS — M9903 Segmental and somatic dysfunction of lumbar region: Secondary | ICD-10-CM | POA: Diagnosis not present

## 2021-12-24 ENCOUNTER — Encounter: Payer: Self-pay | Admitting: Nurse Practitioner

## 2021-12-24 ENCOUNTER — Ambulatory Visit (INDEPENDENT_AMBULATORY_CARE_PROVIDER_SITE_OTHER): Payer: Medicare HMO | Admitting: Nurse Practitioner

## 2021-12-24 VITALS — BP 138/80 | HR 70 | Temp 98.3°F | Ht 71.0 in | Wt 210.0 lb

## 2021-12-24 DIAGNOSIS — R7309 Other abnormal glucose: Secondary | ICD-10-CM | POA: Diagnosis not present

## 2021-12-24 DIAGNOSIS — R42 Dizziness and giddiness: Secondary | ICD-10-CM

## 2021-12-24 DIAGNOSIS — H9201 Otalgia, right ear: Secondary | ICD-10-CM | POA: Diagnosis not present

## 2021-12-24 DIAGNOSIS — M25561 Pain in right knee: Secondary | ICD-10-CM

## 2021-12-24 DIAGNOSIS — R599 Enlarged lymph nodes, unspecified: Secondary | ICD-10-CM | POA: Diagnosis not present

## 2021-12-24 DIAGNOSIS — N183 Chronic kidney disease, stage 3 unspecified: Secondary | ICD-10-CM

## 2021-12-24 DIAGNOSIS — I129 Hypertensive chronic kidney disease with stage 1 through stage 4 chronic kidney disease, or unspecified chronic kidney disease: Secondary | ICD-10-CM

## 2021-12-24 MED ORDER — AMOXICILLIN 500 MG PO CAPS: 500.0000 mg | ORAL_CAPSULE | Freq: Three times a day (TID) | ORAL | 0 refills | Status: DC

## 2021-12-24 MED ORDER — AMLODIPINE BESYLATE 10 MG PO TABS: 10.0000 mg | ORAL_TABLET | Freq: Every day | ORAL | 1 refills | Status: DC

## 2021-12-24 MED ORDER — CANDESARTAN CILEXETIL-HCTZ 32-25 MG PO TABS: 1.0000 | ORAL_TABLET | Freq: Every day | ORAL | 1 refills | Status: DC

## 2021-12-24 NOTE — Progress Notes (Signed)
I,Tianna Badgett,acting as a Education administrator for [employer], FNP.,have documented all relevant documentation on the behalf of [name], FNP,as directed by  [name], FNP while in the presence of [name], [place].  Subjective:     Patient ID: Aaron Snyder , male    DOB: 05/15/1950 , 71 y.o.   MRN: [id number]   Chief Complaint  Patient presents with   Hypertension    HPI  Pt here for Diabetes and hypertension f/u. He has some problems with his right knee with strained tendons/ligaments and severe arthritis. Seen at [facility] he has to make an appt for f/u. He is wearing a brace to his right knee. Noticed the pain after exercising and sitting for a period of time.  He has not taken his medications this morning. Drinks approximately 80-100 oz water a day. He sees Dr. Clover Mealy for his kidneys - his last visit was March/April - yearly f/u    Diabetes He presents for his follow-up diabetic visit. Diabetes type: prediabetes. Hypoglycemia symptoms include dizziness (when he lays down has "spinning to the room" - takes claritin daily). Pertinent negatives for hypoglycemia include no headaches. Pertinent negatives for diabetes include no blurred vision and no chest pain. There are no hypoglycemic complications. There are no diabetic complications. Risk factors for coronary artery disease include hypertension. Current diabetic treatment includes diet.  Hypertension This is a chronic problem. The current episode started more than 1 year ago. The problem is unchanged. The problem is controlled. Associated symptoms include anxiety. Pertinent negatives include no blurred vision, chest pain, headaches, palpitations or shortness of breath. Risk factors for coronary artery disease include obesity and sedentary lifestyle. There is no history of angina.     Past Medical History:  Diagnosis Date   Coronary artery disease    Dysphagia    Head injury    Hypertension    Pacemaker generator end of  life 12/18/2011   [employer]   Presence of permanent cardiac pacemaker 02/01/2003   [employer]   Syncope      Family History  Problem Relation Age of Onset   Diabetes Father    Hypertension Father      Current Outpatient Medications:    amoxicillin (AMOXIL) 500 MG capsule, Take 1 capsule (500 mg total) by mouth 3 (three) times daily., Disp: 14 capsule, Rfl: 0   aspirin EC 81 MG tablet, Take 81 mg by mouth daily., Disp: , Rfl:    cholecalciferol (VITAMIN D3) 25 MCG (1000 UNIT) tablet, Take 1,000 Units by mouth daily., Disp: , Rfl:    loratadine (CLARITIN) 10 MG tablet, Take 10 mg by mouth daily., Disp: , Rfl:    Multiple Vitamins-Minerals (MULTIVITAMIN WITH MINERALS) tablet, Take 1 tablet by mouth daily., Disp: , Rfl:    TURMERIC PO, Take 1 tablet by mouth daily., Disp: , Rfl:    vitamin C (ASCORBIC ACID) 250 MG tablet, Take 250 mg by mouth daily., Disp: , Rfl:    Vitamin D, Ergocalciferol, (DRISDOL) 1.25 MG (50000 UNIT) CAPS capsule, TAKE 1 CAPSULE (50,000 UNITS TOTAL) BY MOUTH EVERY 7 (SEVEN) DAYS., Disp: 12 capsule, Rfl: 0   Zinc 50 MG CAPS, Take 1 capsule by mouth daily., Disp: , Rfl:    amLODipine (NORVASC) 10 MG tablet, Take 1 tablet (10 mg total) by mouth daily., Disp: 90 tablet, Rfl: 1   Candesartan Cilexetil-HCTZ 32-25 MG TABS, Take 1 tablet by mouth daily., Disp: 90 tablet, Rfl: 1   Allergies  Allergen Reactions  Apple Juice Anaphylaxis   Carrot [Daucus Carota] Anaphylaxis   Iodinated Contrast Media Shortness Of Breath and Swelling    PER PATIENT BREAK THROUGH REACTION WITH PRE MEDS-2016-----Can have contrast as long as he has a 13 prep   Iohexol Shortness Of Breath and Swelling    Pt requires 13hr premeds.   Oat Anaphylaxis   Soy Allergy Anaphylaxis   Wheat Anaphylaxis   Almond Meal Itching   Cow's Milk [Milk (Cow)]     Hives, and upset stomach      Review of Systems  Constitutional: Negative.   HENT:  Positive for ear pain (right ear pain about 3 days ago.) and  sore throat (2 weeks ago - and had swelling to his lymph nodes to his neck).   Eyes: Negative.  Negative for blurred vision.  Respiratory: Negative.  Negative for shortness of breath.   Cardiovascular: Negative.  Negative for chest pain, palpitations and leg swelling.  Gastrointestinal: Negative.   Endocrine: Negative.   Genitourinary: Negative.   Musculoskeletal: Negative.   Skin: Negative.   Allergic/Immunologic: Negative.   Neurological:  Positive for dizziness (when he lays down has "spinning to the room" - takes claritin daily). Negative for headaches.  Hematological: Negative.   Psychiatric/Behavioral: Negative.       Today's Vitals   12/24/21 0946  BP: 138/80  Pulse: 70  Temp: 98.3 F (36.8 C)  TempSrc: Oral  Weight: 210 lb (95.3 kg)  Height: _0  (1.803 m)   Body mass index is 29.29 kg/m.  Wt Readings from Last 3 Encounters:  12/24/21 210 lb (95.3 kg)  08/21/21 201 lb (91.2 kg)  04/07/21 209 lb 3.2 oz (94.9 kg)    Objective:  Physical Exam Vitals reviewed.  Constitutional:      General: He is not in acute distress.    Appearance: Normal appearance. He is not ill-appearing.  HENT:     Head: Normocephalic.     Right Ear: External ear normal. There is no impacted cerumen.     Left Ear: External ear normal. There is no impacted cerumen.     Ears:     Comments: Bilateral ears with soft cerumen present unable to visualize the TM well.     Nose: Nose normal.     Mouth/Throat:     Mouth: Mucous membranes are moist.  Eyes:     Conjunctiva/sclera: Conjunctivae normal.     Pupils: Pupils are equal, round, and reactive to light.  Neck:      Comments: Bilateral submandibular lymphadenopathy  Cardiovascular:     Rate and Rhythm: Normal rate and regular rhythm.     Pulses: Normal pulses.     Heart sounds: Normal heart sounds. No murmur heard. Pulmonary:     Effort: Pulmonary effort is normal. No respiratory distress.     Breath sounds: Normal breath sounds. No  wheezing.  Musculoskeletal:     Cervical back: No muscular tenderness.  Skin:    General: Skin is warm and dry.     Capillary Refill: Capillary refill takes less than 2 seconds.  Neurological:     General: No focal deficit present.     Mental Status: He is alert and oriented to person, place, and time.     Cranial Nerves: No cranial nerve deficit.     Motor: No weakness.  Psychiatric:        Mood and Affect: Mood normal.        Behavior: Behavior normal.  Thought Content: Thought content normal.        Judgment: Judgment normal.         Assessment And Plan:     1. Benign hypertension with CKD (chronic kidney disease) stage III (HCC) Comments: Blood pressure is fairly controlled, has not taken medications today. Continue f/u with Nephrology - amLODipine (NORVASC) 10 MG tablet; Take 1 tablet (10 mg total) by mouth daily.  Dispense: 90 tablet; Refill: 1 - Candesartan Cilexetil-HCTZ 32-25 MG TABS; Take 1 tablet by mouth daily.  Dispense: 90 tablet; Refill: 1 - BMP8+EGFR  2. Abnormal glucose Comments: Stable, continue focusing on healthy diet low in sugar and starches. - Hemoglobin A1c - BMP8+EGFR  3. Swelling of lymph node Comments: Bilateral submandibular lymphadenopathy, will treat with antibiotics has been ongoing for 2 weeks. - CBC - amoxicillin (AMOXIL) 500 MG capsule; Take 1 capsule (500 mg total) by mouth 3 (three) times daily.  Dispense: 14 capsule; Refill: 0  4. Right ear pain Comments: Soft cerumen present to ear unable to fully visualize TM.  5. Vertigo Comments: Encouraged to continue staying hydrated. Antibiotics given due to lymphadenopathy treatment may help with vertigo. If not better RTC to office  6. Acute pain of right knee Comments: Continue f/u with [facility]     Patient was given opportunity to ask questions. Patient verbalized understanding of the plan and was able to repeat key elements of the plan. All questions were answered to their  satisfaction.  [name], FNP   I, [name], FNP, have reviewed all documentation for this visit. The documentation on 12/24/21 for the exam, diagnosis, procedures, and orders are all accurate and complete.   IF YOU HAVE BEEN REFERRED TO A SPECIALIST, IT MAY TAKE 1-2 WEEKS TO SCHEDULE/PROCESS THE REFERRAL. IF YOU HAVE NOT HEARD FROM US/SPECIALIST IN TWO WEEKS, PLEASE GIVE [place] A CALL AT [phone] X [phone].   THE PATIENT IS ENCOURAGED TO PRACTICE SOCIAL DISTANCING DUE TO THE COVID-19 PANDEMIC.

## 2021-12-24 NOTE — Patient Instructions (Signed)
Hypertension, Adult High blood pressure (hypertension) is when the force of blood pumping through the arteries is too strong. The arteries are the blood vessels that carry blood from the heart throughout the body. Hypertension forces the heart to work harder to pump blood and may cause arteries to become narrow or stiff. Untreated or uncontrolled hypertension can lead to a heart attack, heart failure, a stroke, kidney disease, and other problems. A blood pressure reading consists of a higher number over a lower number. Ideally, your blood pressure should be below 120/80. The first ("top") number is called the systolic pressure. It is a measure of the pressure in your arteries as your heart beats. The second ("bottom") number is called the diastolic pressure. It is a measure of the pressure in your arteries as the heart relaxes. What are the causes? The exact cause of this condition is not known. There are some conditions that result in high blood pressure. What increases the risk? Certain factors may make you more likely to develop high blood pressure. Some of these risk factors are under your control, including: Smoking. Not getting enough exercise or physical activity. Being overweight. Having too much fat, sugar, calories, or salt (sodium) in your diet. Drinking too much alcohol. Other risk factors include: Having a personal history of heart disease, diabetes, high cholesterol, or kidney disease. Stress. Having a family history of high blood pressure and high cholesterol. Having obstructive sleep apnea. Age. The risk increases with age. What are the signs or symptoms? High blood pressure may not cause symptoms. Very high blood pressure (hypertensive crisis) may cause: Headache. Fast or irregular heartbeats (palpitations). Shortness of breath. Nosebleed. Nausea and vomiting. Vision changes. Severe chest pain, dizziness, and seizures. How is this diagnosed? This condition is diagnosed by  measuring your blood pressure while you are seated, with your arm resting on a flat surface, your legs uncrossed, and your feet flat on the floor. The cuff of the blood pressure monitor will be placed directly against the skin of your upper arm at the level of your heart. Blood pressure should be measured at least twice using the same arm. Certain conditions can cause a difference in blood pressure between your right and left arms. If you have a high blood pressure reading during one visit or you have normal blood pressure with other risk factors, you may be asked to: Return on a different day to have your blood pressure checked again. Monitor your blood pressure at home for 1 week or longer. If you are diagnosed with hypertension, you may have other blood or imaging tests to help your health care provider understand your overall risk for other conditions. How is this treated? This condition is treated by making healthy lifestyle changes, such as eating healthy foods, exercising more, and reducing your alcohol intake. You may be referred for counseling on a healthy diet and physical activity. Your health care provider may prescribe medicine if lifestyle changes are not enough to get your blood pressure under control and if: Your systolic blood pressure is above 130. Your diastolic blood pressure is above 80. Your personal target blood pressure may vary depending on your medical conditions, your age, and other factors. Follow these instructions at home: Eating and drinking  Eat a diet that is high in fiber and potassium, and low in sodium, added sugar, and fat. An example of this eating plan is called the DASH diet. DASH stands for Dietary Approaches to Stop Hypertension. To eat this way: Eat   plenty of fresh fruits and vegetables. Try to fill one half of your plate at each meal with fruits and vegetables. Eat whole grains, such as whole-wheat pasta, brown rice, or whole-grain bread. Fill about one  fourth of your plate with whole grains. Eat or drink low-fat dairy products, such as skim milk or low-fat yogurt. Avoid fatty cuts of meat, processed or cured meats, and poultry with skin. Fill about one fourth of your plate with lean proteins, such as fish, chicken without skin, beans, eggs, or tofu. Avoid pre-made and processed foods. These tend to be higher in sodium, added sugar, and fat. Reduce your daily sodium intake. Many people with hypertension should eat less than 1,500 mg of sodium a day. Do not drink alcohol if: Your health care provider tells you not to drink. You are pregnant, may be pregnant, or are planning to become pregnant. If you drink alcohol: Limit how much you have to: 0-1 drink a day for women. 0-2 drinks a day for men. Know how much alcohol is in your drink. In the [place] one drink equals one 12 oz bottle of beer (355 mL), one 5 oz glass of wine (148 mL), or one 1 oz glass of hard liquor (44 mL). Lifestyle  Work with your health care provider to maintain a healthy body weight or to lose weight. Ask what an ideal weight is for you. Get at least 30 minutes of exercise that causes your heart to beat faster (aerobic exercise) most days of the week. Activities may include walking, swimming, or biking. Include exercise to strengthen your muscles (resistance exercise), such as Pilates or lifting weights, as part of your weekly exercise routine. Try to do these types of exercises for 30 minutes at least 3 days a week. Do not use any products that contain nicotine or tobacco. These products include cigarettes, chewing tobacco, and vaping devices, such as e-cigarettes. If you need help quitting, ask your health care provider. Monitor your blood pressure at home as told by your health care provider. Keep all follow-up visits. This is important. Medicines Take over-the-counter and prescription medicines only as told by your health care provider. Follow directions carefully. Blood  pressure medicines must be taken as prescribed. Do not skip doses of blood pressure medicine. Doing this puts you at risk for problems and can make the medicine less effective. Ask your health care provider about side effects or reactions to medicines that you should watch for. Contact a health care provider if you: Think you are having a reaction to a medicine you are taking. Have headaches that keep coming back (recurring). Feel dizzy. Have swelling in your ankles. Have trouble with your vision. Get help right away if you: Develop a severe headache or confusion. Have unusual weakness or numbness. Feel faint. Have severe pain in your chest or abdomen. Vomit repeatedly. Have trouble breathing. These symptoms may be an emergency. Get help right away. Call 911. Do not wait to see if the symptoms will go away. Do not drive yourself to the hospital. Summary Hypertension is when the force of blood pumping through your arteries is too strong. If this condition is not controlled, it may put you at risk for serious complications. Your personal target blood pressure may vary depending on your medical conditions, your age, and other factors. For most people, a normal blood pressure is less than 120/80. Hypertension is treated with lifestyle changes, medicines, or a combination of both. Lifestyle changes include losing weight, eating a healthy,   low-sodium diet, exercising more, and limiting alcohol. This information is not intended to replace advice given to you by your health care provider. Make sure you discuss any questions you have with your health care provider. Document Revised: 11/19/2020 Document Reviewed: 11/19/2020 Elsevier Patient Education  2023 Elsevier Inc.  

## 2021-12-25 LAB — CBC
Hematocrit: 45.5 % (ref 37.5–51.0)
Hemoglobin: 15.4 g/dL (ref 13.0–17.7)
MCH: 28.7 pg (ref 26.6–33.0)
MCHC: 33.8 g/dL (ref 31.5–35.7)
MCV: 85 fL (ref 79–97)
Platelets: 224 10*3/uL (ref 150–450)
RBC: 5.37 x10E6/uL (ref 4.14–5.80)
RDW: 13 % (ref 11.6–15.4)
WBC: 5.5 10*3/uL (ref 3.4–10.8)

## 2021-12-25 LAB — BMP8+EGFR
BUN/Creatinine Ratio: 13 (ref 10–24)
BUN: 21 mg/dL (ref 8–27)
CO2: 26 mmol/L (ref 20–29)
Calcium: 9.8 mg/dL (ref 8.6–10.2)
Chloride: 101 mmol/L (ref 96–106)
Creatinine, Ser: 1.57 mg/dL — ABNORMAL HIGH (ref 0.76–1.27)
Glucose: 97 mg/dL (ref 70–99)
Potassium: 4 mmol/L (ref 3.5–5.2)
Sodium: 142 mmol/L (ref 134–144)
eGFR: 47 mL/min/{1.73_m2} — ABNORMAL LOW (ref >59)

## 2021-12-25 LAB — HEMOGLOBIN A1C
Est. average glucose Bld gHb Est-mCnc: 123 mg/dL
Hgb A1c MFr Bld: 5.9 % — ABNORMAL HIGH (ref 4.8–5.6)

## 2021-12-29 DIAGNOSIS — M5412 Radiculopathy, cervical region: Secondary | ICD-10-CM | POA: Diagnosis not present

## 2021-12-29 DIAGNOSIS — M5442 Lumbago with sciatica, left side: Secondary | ICD-10-CM | POA: Diagnosis not present

## 2021-12-29 DIAGNOSIS — M9902 Segmental and somatic dysfunction of thoracic region: Secondary | ICD-10-CM | POA: Diagnosis not present

## 2021-12-29 DIAGNOSIS — M9901 Segmental and somatic dysfunction of cervical region: Secondary | ICD-10-CM | POA: Diagnosis not present

## 2021-12-29 DIAGNOSIS — M5441 Lumbago with sciatica, right side: Secondary | ICD-10-CM | POA: Diagnosis not present

## 2021-12-29 DIAGNOSIS — M9903 Segmental and somatic dysfunction of lumbar region: Secondary | ICD-10-CM | POA: Diagnosis not present

## 2022-01-07 DIAGNOSIS — M9902 Segmental and somatic dysfunction of thoracic region: Secondary | ICD-10-CM | POA: Diagnosis not present

## 2022-01-07 DIAGNOSIS — M9901 Segmental and somatic dysfunction of cervical region: Secondary | ICD-10-CM | POA: Diagnosis not present

## 2022-01-07 DIAGNOSIS — M9903 Segmental and somatic dysfunction of lumbar region: Secondary | ICD-10-CM | POA: Diagnosis not present

## 2022-01-14 DIAGNOSIS — M9902 Segmental and somatic dysfunction of thoracic region: Secondary | ICD-10-CM | POA: Diagnosis not present

## 2022-01-14 DIAGNOSIS — M9903 Segmental and somatic dysfunction of lumbar region: Secondary | ICD-10-CM | POA: Diagnosis not present

## 2022-01-14 DIAGNOSIS — M9901 Segmental and somatic dysfunction of cervical region: Secondary | ICD-10-CM | POA: Diagnosis not present

## 2022-01-15 ENCOUNTER — Ambulatory Visit (INDEPENDENT_AMBULATORY_CARE_PROVIDER_SITE_OTHER): Payer: Medicare HMO

## 2022-01-15 DIAGNOSIS — I495 Sick sinus syndrome: Secondary | ICD-10-CM | POA: Diagnosis not present

## 2022-01-16 LAB — CUP PACEART REMOTE DEVICE CHECK
Battery Impedance: 1533 Ohm
Battery Remaining Longevity: 50 mo
Battery Voltage: 2.76 V
Brady Statistic AP VP Percent: 0 %
Brady Statistic AP VS Percent: 97 %
Brady Statistic AS VP Percent: 0 %
Brady Statistic AS VS Percent: 3 %
Date Time Interrogation Session: 20231221135337
Implantable Lead Connection Status: 753985
Implantable Lead Connection Status: 753985
Implantable Lead Implant Date: 20050106
Implantable Lead Implant Date: 20131122
Implantable Lead Location: 753859
Implantable Lead Location: 753860
Implantable Lead Model: 5092
Implantable Lead Model: 5594
Implantable Pulse Generator Implant Date: 20131122
Lead Channel Impedance Value: 411 Ohm
Lead Channel Impedance Value: 516 Ohm
Lead Channel Pacing Threshold Amplitude: 0.5 V
Lead Channel Pacing Threshold Amplitude: 0.625 V
Lead Channel Pacing Threshold Pulse Width: 0.4 ms
Lead Channel Pacing Threshold Pulse Width: 0.4 ms
Lead Channel Setting Pacing Amplitude: 1.5 V
Lead Channel Setting Pacing Amplitude: 2 V
Lead Channel Setting Pacing Pulse Width: 0.4 ms
Lead Channel Setting Sensing Sensitivity: 2.8 mV
Zone Setting Status: 755011
Zone Setting Status: 755011

## 2022-01-28 DIAGNOSIS — M9902 Segmental and somatic dysfunction of thoracic region: Secondary | ICD-10-CM | POA: Diagnosis not present

## 2022-01-28 DIAGNOSIS — M9901 Segmental and somatic dysfunction of cervical region: Secondary | ICD-10-CM | POA: Diagnosis not present

## 2022-01-28 DIAGNOSIS — M9903 Segmental and somatic dysfunction of lumbar region: Secondary | ICD-10-CM | POA: Diagnosis not present

## 2022-02-04 DIAGNOSIS — M9903 Segmental and somatic dysfunction of lumbar region: Secondary | ICD-10-CM | POA: Diagnosis not present

## 2022-02-04 DIAGNOSIS — M9902 Segmental and somatic dysfunction of thoracic region: Secondary | ICD-10-CM | POA: Diagnosis not present

## 2022-02-04 DIAGNOSIS — M9901 Segmental and somatic dysfunction of cervical region: Secondary | ICD-10-CM | POA: Diagnosis not present

## 2022-02-06 NOTE — Progress Notes (Signed)
Remote pacemaker transmission.   

## 2022-02-11 DIAGNOSIS — M9902 Segmental and somatic dysfunction of thoracic region: Secondary | ICD-10-CM | POA: Diagnosis not present

## 2022-02-11 DIAGNOSIS — M9903 Segmental and somatic dysfunction of lumbar region: Secondary | ICD-10-CM | POA: Diagnosis not present

## 2022-02-11 DIAGNOSIS — M9901 Segmental and somatic dysfunction of cervical region: Secondary | ICD-10-CM | POA: Diagnosis not present

## 2022-02-18 DIAGNOSIS — M9901 Segmental and somatic dysfunction of cervical region: Secondary | ICD-10-CM | POA: Diagnosis not present

## 2022-02-18 DIAGNOSIS — M9903 Segmental and somatic dysfunction of lumbar region: Secondary | ICD-10-CM | POA: Diagnosis not present

## 2022-02-18 DIAGNOSIS — M9902 Segmental and somatic dysfunction of thoracic region: Secondary | ICD-10-CM | POA: Diagnosis not present

## 2022-02-25 DIAGNOSIS — M9903 Segmental and somatic dysfunction of lumbar region: Secondary | ICD-10-CM | POA: Diagnosis not present

## 2022-02-25 DIAGNOSIS — M9902 Segmental and somatic dysfunction of thoracic region: Secondary | ICD-10-CM | POA: Diagnosis not present

## 2022-02-25 DIAGNOSIS — M9901 Segmental and somatic dysfunction of cervical region: Secondary | ICD-10-CM | POA: Diagnosis not present

## 2022-03-04 DIAGNOSIS — M9902 Segmental and somatic dysfunction of thoracic region: Secondary | ICD-10-CM | POA: Diagnosis not present

## 2022-03-04 DIAGNOSIS — M9901 Segmental and somatic dysfunction of cervical region: Secondary | ICD-10-CM | POA: Diagnosis not present

## 2022-03-04 DIAGNOSIS — M9903 Segmental and somatic dysfunction of lumbar region: Secondary | ICD-10-CM | POA: Diagnosis not present

## 2022-03-05 ENCOUNTER — Ambulatory Visit (INDEPENDENT_AMBULATORY_CARE_PROVIDER_SITE_OTHER): Payer: Medicare HMO

## 2022-03-05 ENCOUNTER — Ambulatory Visit (INDEPENDENT_AMBULATORY_CARE_PROVIDER_SITE_OTHER): Payer: Medicare HMO | Admitting: Nurse Practitioner

## 2022-03-05 ENCOUNTER — Encounter: Payer: Self-pay | Admitting: Nurse Practitioner

## 2022-03-05 VITALS — BP 122/80 | HR 91 | Temp 97.9°F | Ht 68.0 in | Wt 206.8 lb

## 2022-03-05 VITALS — BP 122/80 | HR 91 | Ht 68.0 in | Wt 206.0 lb

## 2022-03-05 DIAGNOSIS — R7309 Other abnormal glucose: Secondary | ICD-10-CM | POA: Diagnosis not present

## 2022-03-05 DIAGNOSIS — K219 Gastro-esophageal reflux disease without esophagitis: Secondary | ICD-10-CM | POA: Diagnosis not present

## 2022-03-05 DIAGNOSIS — N183 Chronic kidney disease, stage 3 unspecified: Secondary | ICD-10-CM | POA: Diagnosis not present

## 2022-03-05 DIAGNOSIS — I2510 Atherosclerotic heart disease of native coronary artery without angina pectoris: Secondary | ICD-10-CM

## 2022-03-05 DIAGNOSIS — Z0000 Encounter for general adult medical examination without abnormal findings: Secondary | ICD-10-CM

## 2022-03-05 DIAGNOSIS — I129 Hypertensive chronic kidney disease with stage 1 through stage 4 chronic kidney disease, or unspecified chronic kidney disease: Secondary | ICD-10-CM | POA: Diagnosis not present

## 2022-03-05 DIAGNOSIS — E559 Vitamin D deficiency, unspecified: Secondary | ICD-10-CM | POA: Diagnosis not present

## 2022-03-05 DIAGNOSIS — N1831 Chronic kidney disease, stage 3a: Secondary | ICD-10-CM

## 2022-03-05 MED ORDER — CANDESARTAN CILEXETIL-HCTZ 32-25 MG PO TABS: 1.0000 | ORAL_TABLET | Freq: Every day | ORAL | 1 refills | Status: DC

## 2022-03-05 MED ORDER — ATORVASTATIN CALCIUM 10 MG PO TABS: 10.0000 mg | ORAL_TABLET | Freq: Every day | ORAL | 1 refills | Status: DC

## 2022-03-05 MED ORDER — AMLODIPINE BESYLATE 10 MG PO TABS: 10.0000 mg | ORAL_TABLET | Freq: Every day | ORAL | 1 refills | Status: DC

## 2022-03-05 MED ORDER — ATORVASTATIN CALCIUM 10 MG PO TABS: ORAL_TABLET | ORAL | 1 refills | Status: DC

## 2022-03-05 NOTE — Progress Notes (Signed)
I,Sheena H Holbrook,acting as a Education administrator for Aaron Brine, FNP.,have documented all relevant documentation on the behalf of Aaron Brine, FNP,as directed by  Aaron Brine, FNP while in the presence of Aaron Snyder, [place].    Subjective:     Patient ID: Aaron Snyder , male    DOB: 01-Jan-1951 , 72 y.o.   MRN: [id number]   Chief Complaint  Patient presents with   Hypertension   Back Pain    Chronic, denies recent injuries   Dizziness    occasional    HPI  Patient presents today for Htn follow up. Patient has 10/10 LBP, no recent injuries; does see chiropractor this is new. Has not been able to go the gym over the last couple months due to his knee pain. Patient also reports occasional dizziness/lightheaded sensation; denies nausea/headache.   He is having an increase in gas, he is eating raw vegetables, chicken. Does not drink out of straws and is no longer drinking coffee.   He continues to care for his mother and the situation is getting worse.       Past Medical History:  Diagnosis Date   Coronary artery disease    Dysphagia    Head injury    Hypertension    Pacemaker generator end of life 12/18/2011   [employer]   Presence of permanent cardiac pacemaker 02/01/2003   [employer]   Syncope      Family History  Problem Relation Age of Onset   Diabetes Father    Hypertension Father      Current Outpatient Medications:    aspirin EC 81 MG tablet, Take 81 mg by mouth daily., Disp: , Rfl:    cholecalciferol (VITAMIN D3) 25 MCG (1000 UNIT) tablet, Take 1,000 Units by mouth daily., Disp: , Rfl:    loratadine (CLARITIN) 10 MG tablet, Take 10 mg by mouth daily., Disp: , Rfl:    Multiple Vitamins-Minerals (MULTIVITAMIN WITH MINERALS) tablet, Take 1 tablet by mouth daily., Disp: , Rfl:    TURMERIC PO, Take 1 tablet by mouth daily., Disp: , Rfl:    vitamin C (ASCORBIC ACID) 250 MG tablet, Take 250 mg by mouth daily., Disp: , Rfl:    Vitamin D, Ergocalciferol, (DRISDOL) 1.25 MG  (50000 UNIT) CAPS capsule, TAKE 1 CAPSULE (50,000 UNITS TOTAL) BY MOUTH EVERY 7 (SEVEN) DAYS., Disp: 12 capsule, Rfl: 0   Zinc 50 MG CAPS, Take 1 capsule by mouth daily., Disp: , Rfl:    amLODipine (NORVASC) 10 MG tablet, Take 1 tablet (10 mg total) by mouth daily., Disp: 90 tablet, Rfl: 1   atorvastatin (LIPITOR) 10 MG tablet, Take 1 tab by mouth MWF, Disp: 45 tablet, Rfl: 1   Candesartan Cilexetil-HCTZ 32-25 MG TABS, Take 1 tablet by mouth daily., Disp: 90 tablet, Rfl: 1   Allergies  Allergen Reactions   Apple Juice Anaphylaxis   Carrot [Daucus Carota] Anaphylaxis   Iodinated Contrast Media Shortness Of Breath and Swelling    PER PATIENT BREAK THROUGH REACTION WITH PRE MEDS-2016-----Can have contrast as long as he has a 13 prep   Iohexol Shortness Of Breath and Swelling    Pt requires 13hr premeds.   Oat Anaphylaxis   Soy Allergy Anaphylaxis   Cow's Milk [Milk (Cow)]     Hives, and upset stomach      Review of Systems  Constitutional: Negative.   Respiratory: Negative.    Cardiovascular: Negative.   Musculoskeletal:  Positive for back pain.  Neurological:  Positive for dizziness and  light-headedness.  All other systems reviewed and are negative.    Today's Vitals   03/05/22 1057  BP: 122/80  Pulse: 91  SpO2: 91%  Weight: 206 lb (93.4 kg)  Height: 5' 8"$  (1.727 m)  PainSc: 10-Worst pain ever  PainLoc: Back   Body mass index is 31.32 kg/m.   Objective:  Physical Exam Vitals reviewed.  Constitutional:      General: He is not in acute distress.    Appearance: Normal appearance. He is not ill-appearing.  HENT:     Head: Normocephalic.     Nose: Nose normal.     Mouth/Throat:     Mouth: Mucous membranes are moist.  Eyes:     Conjunctiva/sclera: Conjunctivae normal.     Pupils: Pupils are equal, round, and reactive to light.  Cardiovascular:     Rate and Rhythm: Normal rate and regular rhythm.     Pulses: Normal pulses.     Heart sounds: Normal heart sounds. No  murmur heard. Pulmonary:     Effort: Pulmonary effort is normal. No respiratory distress.     Breath sounds: Normal breath sounds. No wheezing.  Skin:    General: Skin is warm and dry.     Capillary Refill: Capillary refill takes less than 2 seconds.  Neurological:     General: No focal deficit present.     Mental Status: He is alert and oriented to person, place, and time.     Cranial Nerves: No cranial nerve deficit.     Motor: No weakness.  Psychiatric:        Mood and Affect: Mood normal.        Behavior: Behavior normal.        Thought Content: Thought content normal.        Judgment: Judgment normal.         Assessment And Plan:     1. Benign hypertension with CKD (chronic kidney disease) stage III (HCC) Comments: Blood pressure is well-controlled.  Continue current medications. - BMP8+eGFR - Candesartan Cilexetil-HCTZ 32-25 MG TABS; Take 1 tablet by mouth daily.  Dispense: 90 tablet; Refill: 1 - amLODipine (NORVASC) 10 MG tablet; Take 1 tablet (10 mg total) by mouth daily.  Dispense: 90 tablet; Refill: 1  2. Coronary artery disease involving native coronary artery of native heart without angina pectoris Comments: Continue statin, tolerating well.  Continue follow-up with cardiology - atorvastatin (LIPITOR) 10 MG tablet; Take 1 tab by mouth MWF  Dispense: 45 tablet; Refill: 1  3. Abnormal glucose Comments: Hemoglobin A1c is stable, continue focusing on healthy diet low in sugar and starches.  Exercise as tolerated - Hemoglobin A1c  4. Vitamin D deficiency Will check vitamin D level and supplement as needed.    Also encouraged to spend 15 minutes in the sun daily.  - VITAMIN D 25 Hydroxy (Vit-D Deficiency, Fractures)   5. Gastroesophageal reflux disease without esophagitis Advised to avoid foods that produce more gas.  Also encouraged to take a probiotic daily.   Patient was given opportunity to ask questions. Patient verbalized understanding of the plan and was  able to repeat key elements of the plan. All questions were answered to their satisfaction.  Aaron Brine, FNP   I, Aaron Brine, FNP, have reviewed all documentation for this visit. The documentation on 03/05/22 for the exam, diagnosis, procedures, and orders are all accurate and complete.   IF YOU HAVE BEEN REFERRED TO A SPECIALIST, IT MAY TAKE 1-2 WEEKS TO SCHEDULE/PROCESS THE REFERRAL.  IF YOU HAVE NOT HEARD FROM US/SPECIALIST IN TWO WEEKS, PLEASE GIVE [place] A CALL AT [phone].   THE PATIENT IS ENCOURAGED TO PRACTICE SOCIAL DISTANCING DUE TO THE COVID-19 PANDEMIC.

## 2022-03-05 NOTE — Patient Instructions (Addendum)
Aaron Snyder , Thank you for taking time to come for your Medicare Wellness Visit. I appreciate your ongoing commitment to your health goals. Please review the following plan we discussed and let me know if I can assist you in the future.   These are the goals we discussed:  Goals       Patient Stated      Wants to be pain free       Patient Stated      02/09/2019, not catch covid and lose 5 pounds      Patient Stated      02/15/2020, wants to weigh 215 pounds and improve mobility      Patient Stated      02/20/2021, get back straight      Patient Stated      03/05/2022, wants to eat better and get back in to exercise, be more socialable      Weight (lb) < 200 lb (90.7 kg) (pt-stated)      Wants to lower BMI        This is a list of the screening recommended for you and due dates:  Health Maintenance  Topic Date Due   COVID-19 Vaccine (7 - 2023-24 season) 12/25/2021   Medicare Annual Wellness Visit  03/06/2023   Cologuard (Stool DNA test)  03/06/2023   DTaP/Tdap/Td vaccine (2 - Td or Tdap) 11/08/2031   Pneumonia Vaccine  Completed   Flu Shot  Completed   Hepatitis C Screening: USPSTF Recommendation to screen - Ages 62-79 yo.  Completed   Zoster (Shingles) Vaccine  Completed   HPV Vaccine  Aged Out    Advanced directives: Advance directive discussed with you today. Even though you declined this today please call our office should you change your mind and we can give you the proper paperwork for you to fill out.  Conditions/risks identified: none  Next appointment: Follow up in one year for your annual wellness visit.   Preventive Care 7 Years and Older, Male  Preventive care refers to lifestyle choices and visits with your health care provider that can promote health and wellness. What does preventive care include? A yearly physical exam. This is also called an annual well check. Dental exams once or twice a year. Routine eye exams. Ask your health care provider how  often you should have your eyes checked. Personal lifestyle choices, including: Daily care of your teeth and gums. Regular physical activity. Eating a healthy diet. Avoiding tobacco and drug use. Limiting alcohol use. Practicing safe sex. Taking low doses of aspirin every day. Taking vitamin and mineral supplements as recommended by your health care provider. What happens during an annual well check? The services and screenings done by your health care provider during your annual well check will depend on your age, overall health, lifestyle risk factors, and family history of disease. Counseling  Your health care provider may ask you questions about your: Alcohol use. Tobacco use. Drug use. Emotional well-being. Home and relationship well-being. Sexual activity. Eating habits. History of falls. Memory and ability to understand (cognition). Work and work Statistician. Screening  You may have the following tests or measurements: Height, weight, and BMI. Blood pressure. Lipid and cholesterol levels. These may be checked every 5 years, or more frequently if you are over 45 years old. Skin check. Lung cancer screening. You may have this screening every year starting at age 51 if you have a 30-pack-year history of smoking and currently smoke or have quit within  the past 15 years. Fecal occult blood test (FOBT) of the stool. You may have this test every year starting at age 41. Flexible sigmoidoscopy or colonoscopy. You may have a sigmoidoscopy every 5 years or a colonoscopy every 10 years starting at age 79. Prostate cancer screening. Recommendations will vary depending on your family history and other risks. Hepatitis C blood test. Hepatitis B blood test. Sexually transmitted disease (STD) testing. Diabetes screening. This is done by checking your blood sugar (glucose) after you have not eaten for a while (fasting). You may have this done every 1-3 years. Abdominal aortic aneurysm  (AAA) screening. You may need this if you are a current or former smoker. Osteoporosis. You may be screened starting at age 79 if you are at high risk. Talk with your health care provider about your test results, treatment options, and if necessary, the need for more tests. Vaccines  Your health care provider may recommend certain vaccines, such as: Influenza vaccine. This is recommended every year. Tetanus, diphtheria, and acellular pertussis (Tdap, Td) vaccine. You may need a Td booster every 10 years. Zoster vaccine. You may need this after age 51. Pneumococcal 13-valent conjugate (PCV13) vaccine. One dose is recommended after age 66. Pneumococcal polysaccharide (PPSV23) vaccine. One dose is recommended after age 79. Talk to your health care provider about which screenings and vaccines you need and how often you need them. This information is not intended to replace advice given to you by your health care provider. Make sure you discuss any questions you have with your health care provider. Document Released: 02/08/2015 Document Revised: 10/02/2015 Document Reviewed: 11/13/2014 Elsevier Interactive Patient Education  2017 [place] Prevention in the Home Falls can cause injuries. They can happen to people of all ages. There are many things you can do to make your home safe and to help prevent falls. What can I do on the outside of my home? Regularly fix the edges of walkways and driveways and fix any cracks. Remove anything that might make you trip as you walk through a door, such as a raised step or threshold. Trim any bushes or trees on the path to your home. Use bright outdoor lighting. Clear any walking paths of anything that might make someone trip, such as rocks or tools. Regularly check to see if handrails are loose or broken. Make sure that both sides of any steps have handrails. Any raised decks and porches should have guardrails on the edges. Have any leaves, snow, or  ice cleared regularly. Use sand or salt on walking paths during winter. Clean up any spills in your garage right away. This includes oil or grease spills. What can I do in the bathroom? Use night lights. Install grab bars by the toilet and in the tub and shower. Do not use towel bars as grab bars. Use non-skid mats or decals in the tub or shower. If you need to sit down in the shower, use a plastic, non-slip stool. Keep the floor dry. Clean up any water that spills on the floor as soon as it happens. Remove soap buildup in the tub or shower regularly. Attach bath mats securely with double-sided non-slip rug tape. Do not have throw rugs and other things on the floor that can make you trip. What can I do in the bedroom? Use night lights. Make sure that you have a light by your bed that is easy to reach. Do not use any sheets or blankets that are too big  for your bed. They should not hang down onto the floor. Have a firm chair that has side arms. You can use this for support while you get dressed. Do not have throw rugs and other things on the floor that can make you trip. What can I do in the kitchen? Clean up any spills right away. Avoid walking on wet floors. Keep items that you use a lot in easy-to-reach places. If you need to reach something above you, use a strong step stool that has a grab bar. Keep electrical cords out of the way. Do not use floor polish or wax that makes floors slippery. If you must use wax, use non-skid floor wax. Do not have throw rugs and other things on the floor that can make you trip. What can I do with my stairs? Do not leave any items on the stairs. Make sure that there are handrails on both sides of the stairs and use them. Fix handrails that are broken or loose. Make sure that handrails are as long as the stairways. Check any carpeting to make sure that it is firmly attached to the stairs. Fix any carpet that is loose or worn. Avoid having throw rugs at  the top or bottom of the stairs. If you do have throw rugs, attach them to the floor with carpet tape. Make sure that you have a light switch at the top of the stairs and the bottom of the stairs. If you do not have them, ask someone to add them for you. What else can I do to help prevent falls? Wear shoes that: Do not have high heels. Have rubber bottoms. Are comfortable and fit you well. Are closed at the toe. Do not wear sandals. If you use a stepladder: Make sure that it is fully opened. Do not climb a closed stepladder. Make sure that both sides of the stepladder are locked into place. Ask someone to hold it for you, if possible. Clearly mark and make sure that you can see: Any grab bars or handrails. First and last steps. Where the edge of each step is. Use tools that help you move around (mobility aids) if they are needed. These include: Canes. Walkers. Scooters. Crutches. Turn on the lights when you go into a dark area. Replace any light bulbs as soon as they burn out. Set up your furniture so you have a clear path. Avoid moving your furniture around. If any of your floors are uneven, fix them. If there are any pets around you, be aware of where they are. Review your medicines with your doctor. Some medicines can make you feel dizzy. This can increase your chance of falling. Ask your doctor what other things that you can do to help prevent falls. This information is not intended to replace advice given to you by your health care provider. Make sure you discuss any questions you have with your health care provider. Document Released: 11/08/2008 Document Revised: 06/20/2015 Document Reviewed: 02/16/2014 Elsevier Interactive Patient Education  2017 [employer].

## 2022-03-05 NOTE — Progress Notes (Signed)
Subjective:   Aaron Snyder is a 72 y.o. male who presents for Medicare Annual/Subsequent preventive examination.  Review of Systems     Cardiac Risk Factors include: advanced age (>27mn, >>17women);hypertension;male gender;obesity (BMI >30kg/m2)     Objective:    Today's Vitals   03/05/22 1019 03/05/22 1025  BP: 122/80   Pulse: 91   Temp: 97.9 F (36.6 C)   TempSrc: Oral   SpO2: 91%   Weight: 206 lb 12.8 oz (93.8 kg)   Height: '5\' 8"'$  (1.727 m)   PainSc:  10-Worst pain ever   Body mass index is 31.44 kg/m.     03/05/2022   10:30 AM 02/20/2021    9:50 AM 02/15/2020    9:33 AM 02/09/2019    9:00 AM 02/03/2018   11:08 AM 12/26/2014    7:52 AM 10/14/2013   10:38 PM  Advanced Directives  Does Patient Have a Medical Advance Directive? No No No No No No No  Would patient like information on creating a medical advance directive? No - Patient declined No - Patient declined No - Patient declined Yes (MAU/Ambulatory/Procedural Areas - Information given) Yes (MAU/Ambulatory/Procedural Areas - Information given) Yes - Educational materials given No - patient declined information    Current Medications (verified) Outpatient Encounter Medications as of 03/05/2022  Medication Sig   amLODipine (NORVASC) 10 MG tablet Take 1 tablet (10 mg total) by mouth daily.   aspirin EC 81 MG tablet Take 81 mg by mouth daily.   Candesartan Cilexetil-HCTZ 32-25 MG TABS Take 1 tablet by mouth daily.   cholecalciferol (VITAMIN D3) 25 MCG (1000 UNIT) tablet Take 1,000 Units by mouth daily.   loratadine (CLARITIN) 10 MG tablet Take 10 mg by mouth daily.   Multiple Vitamins-Minerals (MULTIVITAMIN WITH MINERALS) tablet Take 1 tablet by mouth daily.   vitamin C (ASCORBIC ACID) 250 MG tablet Take 250 mg by mouth daily.   Vitamin D, Ergocalciferol, (DRISDOL) 1.25 MG (50000 UNIT) CAPS capsule TAKE 1 CAPSULE (50,000 UNITS TOTAL) BY MOUTH EVERY 7 (SEVEN) DAYS.   Zinc 50 MG CAPS Take 1 capsule by mouth daily.    amoxicillin (AMOXIL) 500 MG capsule Take 1 capsule (500 mg total) by mouth 3 (three) times daily. (Patient not taking: Reported on 03/05/2022)   TURMERIC PO Take 1 tablet by mouth daily. (Patient not taking: Reported on 03/05/2022)   No facility-administered encounter medications on file as of 03/05/2022.    Allergies (verified) Apple juice, Carrot [daucus carota], Iodinated contrast media, Iohexol, Oat, Soy allergy, Wheat, Almond meal, and Cow's milk [milk (cow)]   History: Past Medical History:  Diagnosis Date   Coronary artery disease    Dysphagia    Head injury    Hypertension    Pacemaker generator end of life 12/18/2011   Medtronic   Presence of permanent cardiac pacemaker 02/01/2003   Medtronic   Syncope    Past Surgical History:  Procedure Laterality Date   CARDIAC CATHETERIZATION N/A 12/26/2014   Procedure: Left Heart Cath and Coronary Angiography;  Surgeon: TTroy Sine MD;  [facility];  Service: Cardiovascular;  Laterality: N/A;   ESOPHAGOGASTRODUODENOSCOPY     [place]   LEFT AND RIGHT HEART CATHETERIZATION WITH CORONARY ANGIOGRAM N/A 12/17/2011   Procedure: LEFT AND RIGHT HEART CATHETERIZATION WITH CORONARY ANGIOGRAM;  Surgeon: DLeonie Man MD;  [facility];  Service: Cardiovascular;  Laterality: N/A;   LOOP RECORDER IMPLANT/EXPLANT  2005   NM MYOCAR PERF WALL  MOTION  01/2011   Negative   PACEMAKER GENERATOR CHANGE  12/18/2011   Medtronic   PACEMAKER INSERTION  2005   Medtronic   PERMANENT PACEMAKER GENERATOR CHANGE N/A 12/18/2011   Procedure: PERMANENT PACEMAKER GENERATOR CHANGE;  Surgeon: [name], MD;  Location: [facility] CATH LAB;  Service: Cardiovascular;  Laterality: N/A;   TOTAL SHOULDER ARTHROPLASTY     Family History  Problem Relation Age of Onset   Diabetes Father    Hypertension Father    Social History   Socioeconomic History   Marital status: Widowed    Spouse name: Not on file   Number of children: Not on file   Years  of education: Not on file   Highest education level: Not on file  Occupational History   Occupation: Disabled    Employer: UNEMPLOYED  Tobacco Use   Smoking status: Former    Packs/day: 1.00    Years: 15.00    Total pack years: 15.00    Types: Cigarettes    Quit date: 03/15/1996    Years since quitting: 25.9   Smokeless tobacco: Never  Vaping Use   Vaping Use: Never used  Substance and Sexual Activity   Alcohol use: No   Drug use: Not Currently    Types: Marijuana   Sexual activity: Not Currently  Other Topics Concern   Not on file  Social History Narrative   Not on file   Social Determinants of Health   Financial Resource Strain: Low Risk  (03/05/2022)   Overall Financial Resource Strain (CARDIA)    Difficulty of Paying Living Expenses: Not hard at all  Food Insecurity: No Food Insecurity (03/05/2022)   Hunger Vital Sign    Worried About Running Out of Food in the Last Year: Never true    [place] in the Last Year: Never true  Transportation Needs: No Transportation Needs (03/05/2022)   PRAPARE - Hydrologist (Medical): No    Lack of Transportation (Non-Medical): No  Physical Activity: Inactive (03/05/2022)   Exercise Vital Sign    Days of Exercise per Week: 0 days    Minutes of Exercise per Session: 0 min  Stress: No Stress Concern Present (03/05/2022)   [place]    Feeling of Stress : Only a little  Social Connections: Moderately Isolated (08/15/2020)   Social Connection and Isolation Panel [NHANES]    Frequency of Communication with Friends and Family: More than three times a week    Frequency of Social Gatherings with Friends and Family: Not on file    Attends Religious Services: 1 to 4 times per year    Active Member of [employer] or Organizations: No    Attends Archivist Meetings: Not on file    Marital Status: Widowed    Tobacco Counseling Counseling given:  Not Answered   Clinical Intake:  Pre-visit preparation completed: Yes  Pain : 0-10 Pain Score: 10-Worst pain ever Pain Type: Chronic pain Pain Location: Back Pain Orientation: Lower Pain Descriptors / Indicators: Aching Pain Onset: More than a month ago Pain Frequency: Constant     Nutritional Status: BMI > 30  Obese Nutritional Risks: None Diabetes: No  How often do you need to have someone help you when you read instructions, pamphlets, or other written materials from your doctor or pharmacy?: 1 - Never  Diabetic? no  Interpreter Needed?: No  Information entered by :: NAllen LPN  Activities of Daily Living    03/05/2022   10:31 AM  In your present state of health, do you have any difficulty performing the following activities:  Hearing? 0  Vision? 1  Comment has floaters  Difficulty concentrating or making decisions? 0  Walking or climbing stairs? 0  Dressing or bathing? 0  Doing errands, shopping? 0  Preparing Food and eating ? N  Using the Toilet? N  In the past six months, have you accidently leaked urine? N  Do you have problems with loss of bowel control? N  Managing your Medications? N  Managing your Finances? N  Housekeeping or managing your Housekeeping? N    Patient Care Team: [name], FNP as PCP - General (General Practice) Croitoru, Dani Gobble, MD as PCP - Cardiology (Cardiology) Warden Fillers, MD as Consulting Physician (Ophthalmology)  Indicate any recent Medical Services you may have received from other than [facility] providers in the past year (date may be approximate).     Assessment:   This is a routine wellness examination for [place].  Hearing/Vision screen Vision Screening - Comments:: No regular eye exams,   Dietary issues and exercise activities discussed: Current Exercise Habits: The patient does not participate in regular exercise at present   Goals Addressed             This Visit's Progress    Patient Stated        03/05/2022, wants to eat better and get back in to exercise, be more socialable       Depression Screen    03/05/2022   10:31 AM 12/24/2021    9:48 AM 02/20/2021    9:51 AM 02/15/2020    9:35 AM 03/13/2019    8:42 AM 02/09/2019    9:02 AM 08/04/2018    8:44 AM  PHQ 2/9 Scores  PHQ - 2 Score 0 0 0 0 0 1 0  PHQ- 9 Score      7     Fall Risk    03/05/2022   10:31 AM 12/24/2021    9:48 AM 02/20/2021    9:51 AM 02/15/2020    9:34 AM 03/13/2019    8:42 AM  Fall Risk   Falls in the past year? 0 0 0 1 0  Comment    got dizzy   Number falls in past yr: 0 0     Injury with Fall? 0 0     Risk for fall due to : Medication side effect No Fall Risks Medication side effect Medication side effect   Follow up Falls prevention discussed;Education provided;Falls evaluation completed Falls evaluation completed Falls evaluation completed;Education provided;Falls prevention discussed Falls evaluation completed;Education provided;Falls prevention discussed     FALL RISK PREVENTION PERTAINING TO THE HOME:  Any stairs in or around the home? Yes  If so, are there any without handrails? Yes  Home free of loose throw rugs in walkways, pet beds, electrical cords, etc? Yes  Adequate lighting in your home to reduce risk of falls? Yes   ASSISTIVE DEVICES UTILIZED TO PREVENT FALLS:  Life alert? No  Use of a cane, walker or w/c? No  Grab bars in the bathroom? No  Shower chair or bench in shower? No  Elevated toilet seat or a handicapped toilet? No   TIMED UP AND GO:  Was the test performed? Yes .  Length of time to ambulate 10 feet: 5 sec.   Gait slow and steady without use of assistive device  Cognitive Function:  03/05/2022   10:32 AM 02/20/2021    9:52 AM 02/15/2020    9:38 AM 02/09/2019    9:09 AM 02/03/2018   11:18 AM  6CIT Screen  What Year? 0 points 0 points 0 points 0 points 0 points  What month? 0 points 0 points 0 points 0 points 0 points  What time? 0 points 0 points 0 points 0  points 3 points  Count back from 20 0 points 0 points 0 points 0 points 0 points  Months in reverse 0 points 0 points 0 points 2 points 0 points  Repeat phrase 0 points 0 points 0 points 0 points 0 points  Total Score 0 points 0 points 0 points 2 points 3 points    Immunizations Immunization History  Administered Date(s) Administered   Influenza, High Dose Seasonal PF 11/08/2018, 10/16/2019   Influenza,inj,Quad PF,6+ Mos 10/16/2013   Influenza-Unspecified 10/16/2019, 11/05/2020, 10/30/2021   PFIZER Comirnaty(Gray Top)Covid-19 Tri-Sucrose Vaccine 06/14/2020   PFIZER(Purple Top)SARS-COV-2 Vaccination 03/21/2019, 04/11/2019, 10/28/2019   Pfizer Covid-19 Vaccine Bivalent Booster 32yr & up 11/21/2020, 10/30/2021   Pneumococcal Conjugate-13 03/02/2018   Pneumococcal Polysaccharide-23 06/01/2019   Zoster Recombinat (Shingrix) 05/09/2020, 11/05/2020    TDAP status: Up to date  Flu Vaccine status: Up to date  Pneumococcal vaccine status: Up to date  Covid-19 vaccine status: Completed vaccines  Qualifies for Shingles Vaccine? Yes   Zostavax completed Yes   Shingrix Completed?: Yes  Screening Tests Health Maintenance  Topic Date Due   DTaP/Tdap/Td (1 - Tdap) Never done   COVID-19 Vaccine (7 - 2023-24 season) 12/25/2021   Medicare Annual Wellness (AWV)  02/20/2022   Fecal DNA (Cologuard)  03/06/2023   Pneumonia Vaccine 72 Years old  Completed   INFLUENZA VACCINE  Completed   Hepatitis C Screening  Completed   Zoster Vaccines- Shingrix  Completed   HPV VACCINES  Aged Out    Health Maintenance  Health Maintenance Due  Topic Date Due   DTaP/Tdap/Td (1 - Tdap) Never done   COVID-19 Vaccine (7 - 2023-24 season) 12/25/2021   Medicare Annual Wellness (AWV)  02/20/2022    Colorectal cancer screening: Type of screening: Cologuard. Completed 03/05/2020. Repeat every 3 years  Lung Cancer Screening: (Low Dose CT Chest recommended if Age 73-80years, 30 pack-year currently smoking OR  have quit w/in 15years.) does not qualify.   Lung Cancer Screening Referral: no  Additional Screening:  Hepatitis C Screening: does qualify; Completed 02/03/2018  Vision Screening: Recommended annual ophthalmology exams for early detection of glaucoma and other disorders of the eye. Is the patient up to date with their annual eye exam?  No  Who is the provider or what is the name of the office in which the patient attends annual eye exams? none If pt is not established with a provider, would they like to be referred to a provider to establish care? No .   Dental Screening: Recommended annual dental exams for proper oral hygiene  Community Resource Referral / Chronic Care Management: CRR required this visit?  No   CCM required this visit?  No      Plan:     I have personally reviewed and noted the following in the patient's chart:   Medical and social history Use of alcohol, tobacco or illicit drugs  Current medications and supplements including opioid prescriptions. Patient is not currently taking opioid prescriptions. Functional ability and status Nutritional status Physical activity Advanced directives List of other physicians Hospitalizations, surgeries, and ER visits in  previous 12 months Vitals Screenings to include cognitive, depression, and falls Referrals and appointments  In addition, I have reviewed and discussed with patient certain preventive protocols, quality metrics, and best practice recommendations. A written personalized care plan for preventive services as well as general preventive health recommendations were provided to patient.     Kellie Simmering, LPN   07/03/2572   Nurse Notes: none

## 2022-03-06 LAB — HEMOGLOBIN A1C
Est. average glucose Bld gHb Est-mCnc: 120 mg/dL
Hgb A1c MFr Bld: 5.8 % — ABNORMAL HIGH (ref 4.8–5.6)

## 2022-03-06 LAB — BMP8+EGFR
BUN/Creatinine Ratio: 14 (ref 10–24)
BUN: 18 mg/dL (ref 8–27)
CO2: 24 mmol/L (ref 20–29)
Calcium: 9.5 mg/dL (ref 8.6–10.2)
Chloride: 98 mmol/L (ref 96–106)
Creatinine, Ser: 1.33 mg/dL — ABNORMAL HIGH (ref 0.76–1.27)
Glucose: 88 mg/dL (ref 70–99)
Potassium: 3.7 mmol/L (ref 3.5–5.2)
Sodium: 138 mmol/L (ref 134–144)
eGFR: 57 mL/min/{1.73_m2} — ABNORMAL LOW (ref >59)

## 2022-03-06 LAB — VITAMIN D 25 HYDROXY (VIT D DEFICIENCY, FRACTURES): Vit D, 25-Hydroxy: 37 ng/mL (ref 30.0–100.0)

## 2022-03-11 DIAGNOSIS — M546 Pain in thoracic spine: Secondary | ICD-10-CM | POA: Diagnosis not present

## 2022-03-11 DIAGNOSIS — M9902 Segmental and somatic dysfunction of thoracic region: Secondary | ICD-10-CM | POA: Diagnosis not present

## 2022-03-11 DIAGNOSIS — M9903 Segmental and somatic dysfunction of lumbar region: Secondary | ICD-10-CM | POA: Diagnosis not present

## 2022-03-11 DIAGNOSIS — M9901 Segmental and somatic dysfunction of cervical region: Secondary | ICD-10-CM | POA: Diagnosis not present

## 2022-03-11 DIAGNOSIS — M5412 Radiculopathy, cervical region: Secondary | ICD-10-CM | POA: Diagnosis not present

## 2022-03-18 DIAGNOSIS — M9903 Segmental and somatic dysfunction of lumbar region: Secondary | ICD-10-CM | POA: Diagnosis not present

## 2022-03-18 DIAGNOSIS — M9902 Segmental and somatic dysfunction of thoracic region: Secondary | ICD-10-CM | POA: Diagnosis not present

## 2022-03-18 DIAGNOSIS — M9901 Segmental and somatic dysfunction of cervical region: Secondary | ICD-10-CM | POA: Diagnosis not present

## 2022-03-21 DIAGNOSIS — E559 Vitamin D deficiency, unspecified: Secondary | ICD-10-CM | POA: Insufficient documentation

## 2022-04-16 ENCOUNTER — Ambulatory Visit (INDEPENDENT_AMBULATORY_CARE_PROVIDER_SITE_OTHER): Payer: Medicare HMO

## 2022-04-16 DIAGNOSIS — I495 Sick sinus syndrome: Secondary | ICD-10-CM | POA: Diagnosis not present

## 2022-04-17 LAB — CUP PACEART REMOTE DEVICE CHECK
Battery Impedance: 1647 Ohm
Battery Remaining Longevity: 48 mo
Battery Voltage: 2.75 V
Brady Statistic AP VP Percent: 0 %
Brady Statistic AP VS Percent: 97 %
Brady Statistic AS VP Percent: 0 %
Brady Statistic AS VS Percent: 2 %
Date Time Interrogation Session: 20240322095848
Implantable Lead Connection Status: 753985
Implantable Lead Connection Status: 753985
Implantable Lead Implant Date: 20050106
Implantable Lead Implant Date: 20131122
Implantable Lead Location: 753859
Implantable Lead Location: 753860
Implantable Lead Model: 5092
Implantable Lead Model: 5594
Implantable Pulse Generator Implant Date: 20131122
Lead Channel Impedance Value: 417 Ohm
Lead Channel Impedance Value: 550 Ohm
Lead Channel Pacing Threshold Amplitude: 0.5 V
Lead Channel Pacing Threshold Amplitude: 0.625 V
Lead Channel Pacing Threshold Pulse Width: 0.4 ms
Lead Channel Pacing Threshold Pulse Width: 0.4 ms
Lead Channel Setting Pacing Amplitude: 1.5 V
Lead Channel Setting Pacing Amplitude: 2 V
Lead Channel Setting Pacing Pulse Width: 0.4 ms
Lead Channel Setting Sensing Sensitivity: 2.8 mV
Zone Setting Status: 755011
Zone Setting Status: 755011

## 2022-04-20 ENCOUNTER — Encounter: Payer: Self-pay | Admitting: Cardiovascular Disease

## 2022-04-20 ENCOUNTER — Ambulatory Visit: Payer: Medicare HMO | Attending: Cardiovascular Disease | Admitting: Cardiovascular Disease

## 2022-04-20 VITALS — BP 120/82 | HR 64 | Ht 71.0 in | Wt 209.4 lb

## 2022-04-20 DIAGNOSIS — I2510 Atherosclerotic heart disease of native coronary artery without angina pectoris: Secondary | ICD-10-CM | POA: Diagnosis not present

## 2022-04-20 DIAGNOSIS — Z950 Presence of cardiac pacemaker: Secondary | ICD-10-CM | POA: Diagnosis not present

## 2022-04-20 DIAGNOSIS — N1831 Chronic kidney disease, stage 3a: Secondary | ICD-10-CM | POA: Diagnosis not present

## 2022-04-20 DIAGNOSIS — I495 Sick sinus syndrome: Secondary | ICD-10-CM | POA: Diagnosis not present

## 2022-04-20 DIAGNOSIS — I10 Essential (primary) hypertension: Secondary | ICD-10-CM | POA: Diagnosis not present

## 2022-04-20 DIAGNOSIS — I7121 Aneurysm of the ascending aorta, without rupture: Secondary | ICD-10-CM

## 2022-04-20 NOTE — Progress Notes (Signed)
Patient ID: Aaron Snyder, male   DOB: 1950/12/23, 72 y.o.   MRN: [id number]    Cardiology Office Note    Date:  04/20/2022   ID:  Aaron, Snyder 02/01/1950, MRN [id number]  PCP:  [name], FNP  Cardiologist:   Sanda Klein, MD   Chief Complaint  Patient presents with   Pacemaker Check    History of Present Illness:  Aaron Snyder is a 72 y.o. male who returns for follow-up on his pacemaker, initially implanted in 2005, most recent generator change 2013 for syncope related to sinus node dysfunction with pauses.  He has had fatigue related to chronotropic incompetence, well compensated by adjustments in his rate response sensor.  His current device is a Medtronic Adapta DR, the atrial lead is a Medtronic [id number], the ventricular is a Medtronic [id number] implanted in 2005.  She is  He is quite well from a cardiovascular point of view, but his activity is limited by back pain, as well as left hip pain.  He has extensive degenerative disc disease.  He has been told that he needs to have lumbar spine rods implanted, but he has been delaying this procedure since he needs to take care of his 34 year old mother who has progressive dementia.  He is exercising less recently.  The patient specifically denies any chest pain at rest or with exertion, dyspnea at rest or with exertion, orthopnea, paroxysmal nocturnal dyspnea, syncope, palpitations, focal neurological deficits, intermittent claudication, lower extremity edema, unexplained weight gain, cough, hemoptysis or wheezing.  He has not had any of the postexercise dizziness that he complained of in the past.  He has not been aware of palpitations (in the past brief sensations of "burning" in the midsternal area correlated with PACs).    Pacemaker function is normal.  Estimated generator longevity is about 4 years.  Counters show 97.4% atrial pacing with good heart rate histogram distribution and only 0.2% ventricular pacing.  He has had 4  very brief episodes of atrial mode switch, no electrograms available.  He has not had atrial fibrillation or high ventricular rates (in the past he had occasional nonsustained VT).  All lead parameters are within normal range.  2019 CT showed mild enlargement of the ascending aorta at 38 mm, plan to reevaluate this with an echocardiogram to avoid contrast exposure since he has mild elevation in creatinine.  In late 2016, he noticed a reduction in exercise tolerance and his nuclear perfusion study appeared to show a large, new inferior wall scar. Coronary angiography actually showed very minor coronary atherosclerotic lesions (max stenosis 25%) as well as normal left ventricular regional and global systolic function.  We brought him back for a treadmill stress test in October 2017 and adjusted his pacemaker sensor settings.  This led to a remarkable improvement in exercise capacity.  He was worried about an abnormality detected on the chest x-ray, but chest CT showed that this was actually projection of a chronic benign rib abnormality.  He does have some emphysema changes in his lungs.  He quit smoking 20 years ago.  Incidental note was made of minor aneurysmal dilatation of the distal aortic arch and descending thoracic aorta with diameter of 3.8-3.9 cm.  Coronary angiography has shown no progression of disease from 2005 to 2013 to 2016. Has a a history of head injury and cervical spine problems, GERD, pneumonia 2016.     Past Medical History:  Diagnosis Date   Coronary artery disease  Dysphagia    Head injury    Hypertension    Pacemaker generator end of life 12/18/2011   Medtronic   Presence of permanent cardiac pacemaker 02/01/2003   Medtronic   Syncope     Past Surgical History:  Procedure Laterality Date   CARDIAC CATHETERIZATION N/A 12/26/2014   Procedure: Left Heart Cath and Coronary Angiography;  Surgeon: [name], MD;  Location: [facility];  Service:  Cardiovascular;  Laterality: N/A;   ESOPHAGOGASTRODUODENOSCOPY     [place]   LEFT AND RIGHT HEART CATHETERIZATION WITH CORONARY ANGIOGRAM N/A 12/17/2011   Procedure: LEFT AND RIGHT HEART CATHETERIZATION WITH CORONARY ANGIOGRAM;  Surgeon: [name], MD;  Location: [facility] CATH LAB;  Service: Cardiovascular;  Laterality: N/A;   LOOP RECORDER IMPLANT/EXPLANT  2005   NM MYOCAR PERF WALL MOTION  01/2011   Negative   PACEMAKER GENERATOR CHANGE  12/18/2011   Medtronic   PACEMAKER INSERTION  2005   Medtronic   PERMANENT PACEMAKER GENERATOR CHANGE N/A 12/18/2011   Procedure: PERMANENT PACEMAKER GENERATOR CHANGE;  Surgeon: [name], MD;  Location: [facility] CATH LAB;  Service: Cardiovascular;  Laterality: N/A;   TOTAL SHOULDER ARTHROPLASTY      Outpatient Medications Prior to Visit  Medication Sig Dispense Refill   amLODipine (NORVASC) 10 MG tablet Take 1 tablet (10 mg total) by mouth daily. 90 tablet 1   aspirin EC 81 MG tablet Take 81 mg by mouth daily.     atorvastatin (LIPITOR) 10 MG tablet Take 1 tab by mouth MWF 45 tablet 1   Candesartan Cilexetil-HCTZ 32-25 MG TABS Take 1 tablet by mouth daily. 90 tablet 1   cholecalciferol (VITAMIN D3) 25 MCG (1000 UNIT) tablet Take 1,000 Units by mouth daily.     loratadine (CLARITIN) 10 MG tablet Take 10 mg by mouth daily.     Multiple Vitamins-Minerals (MULTIVITAMIN WITH MINERALS) tablet Take 1 tablet by mouth daily.     TURMERIC PO Take 1 tablet by mouth daily.     Zinc 50 MG CAPS Take 1 capsule by mouth daily.     vitamin C (ASCORBIC ACID) 250 MG tablet Take 250 mg by mouth daily. (Patient not taking: Reported on 04/20/2022)     Vitamin D, Ergocalciferol, (DRISDOL) 1.25 MG (50000 UNIT) CAPS capsule TAKE 1 CAPSULE (50,000 UNITS TOTAL) BY MOUTH EVERY 7 (SEVEN) DAYS. (Patient not taking: Reported on 04/20/2022) 12 capsule 0   No facility-administered medications prior to visit.     Allergies:   Apple juice, Carrot [daucus carota], Iodinated contrast  media, Iohexol, Oat, Soy allergy, and Cow's milk [milk (cow)]   Social History   Socioeconomic History   Marital status: Widowed    Spouse name: Not on file   Number of children: Not on file   Years of education: Not on file   Highest education level: Not on file  Occupational History   Occupation: Disabled    Employer: UNEMPLOYED  Tobacco Use   Smoking status: Former    Packs/day: 1.00    Years: 15.00    Additional pack years: 0.00    Total pack years: 15.00    Types: Cigarettes    Quit date: 03/15/1996    Years since quitting: 26.1   Smokeless tobacco: Never  Vaping Use   Vaping Use: Never used  Substance and Sexual Activity   Alcohol use: No   Drug use: Not Currently    Types: Marijuana   Sexual activity: Not Currently  Other Topics  Concern   Not on file  Social History Narrative   Not on file   Social Determinants of Health   Financial Resource Strain: Low Risk  (03/05/2022)   Overall Financial Resource Strain (CARDIA)    Difficulty of Paying Living Expenses: Not hard at all  Food Insecurity: No Food Insecurity (03/05/2022)   Hunger Vital Sign    Worried About Running Out of Food in the Last Year: Never true    Ran Out of Food in the Last Year: Never true  Transportation Needs: No Transportation Needs (03/05/2022)   PRAPARE - Hydrologist (Medical): No    Lack of Transportation (Non-Medical): No  Physical Activity: Inactive (03/05/2022)   Exercise Vital Sign    Days of Exercise per Week: 0 days    Minutes of Exercise per Session: 0 min  Stress: No Stress Concern Present (03/05/2022)   [place]    Feeling of Stress : Only a little  Social Connections: Moderately Isolated (08/15/2020)   Social Connection and Isolation Panel [NHANES]    Frequency of Communication with Friends and Family: More than three times a week    Frequency of Social Gatherings with Friends and Family:  Not on file    Attends Religious Services: 1 to 4 times per year    Active Member of [employer] or Organizations: No    Attends Archivist Meetings: Not on file    Marital Status: Widowed      ROS:   Please see the history of present illness.    ROS All other systems are reviewed and are negative.   PHYSICAL EXAM:   VS:  BP 120/82 (BP Location: Left Arm, Patient Position: Sitting, Cuff Size: Large)   Pulse 64   Ht 5\' 11"  (1.803 m)   Wt 209 lb 6.4 oz (95 kg)   SpO2 99%   BMI 29.21 kg/m       General: Alert, oriented x3, no distress, healthy right subclavian pacemaker site Head: no evidence of trauma, PERRL, EOMI, no exophtalmos or lid lag, no myxedema, no xanthelasma; normal ears, nose and oropharynx Neck: normal jugular venous pulsations and no hepatojugular reflux; brisk carotid pulses without delay and no carotid bruits Chest: clear to auscultation, no signs of consolidation by percussion or palpation, normal fremitus, symmetrical and full respiratory excursions Cardiovascular: normal position and quality of the apical impulse, regular rhythm, normal first and second heart sounds, no murmurs, rubs or gallops Abdomen: no tenderness or distention, no masses by palpation, no abnormal pulsatility or arterial bruits, normal bowel sounds, no hepatosplenomegaly Extremities: no clubbing, cyanosis or edema; 2+ radial, ulnar and brachial pulses bilaterally; 2+ right femoral, posterior tibial and dorsalis pedis pulses; 2+ left femoral, posterior tibial and dorsalis pedis pulses; no subclavian or femoral bruits Neurological: grossly nonfocal Psych: Normal mood and affect      Wt Readings from Last 3 Encounters:  04/20/22 209 lb 6.4 oz (95 kg)  03/05/22 206 lb (93.4 kg)  03/05/22 206 lb 12.8 oz (93.8 kg)      Studies/Labs Reviewed:   EKG:  EKG is ordered today.  Personally viewed, shows atrial paced, ventricular sensed rhythm with a long AV delay 234 ms  Recent  Labs: 08/21/2021: ALT 16 12/24/2021: Hemoglobin 15.4; Platelets 224 03/05/2022: BUN 18; Creatinine, Ser 1.33; Potassium 3.7; Sodium 138  Requested most recent labs from Dr. Baird [facility] office.  ASSESSMENT:    1. SSS (sick sinus  syndrome) ([place])   2. Coronary artery disease involving native coronary artery of native heart without angina pectoris   3. Essential hypertension   4. Pacemaker   5. Aneurysm of ascending aorta without rupture (HCC)   6. Stage 3a chronic kidney disease (HCC)     PLAN:  In order of problems listed above:  SSS: Heart rate histograms show appropriate sensor settings.  No longer has postexercise dizziness after the extended D post exercise heart rate increase.  Reminded him that he should always cool down after exercise. CAD: He does not have angina pectoris.  Previous angiography has only shown mild plaque even though the nuclear stress test in 2016 suggested inferior scar.  In the past he has had a fleeting burning sensation in his chest in the past correlated with PACs.  He does not have angina pectoris when he exercises.  Mild plaque by previous angiography.  Chest pain has led to angiography on 3 different occasions, most recently December 2016, without any evidence of progression of coronary disease and without significant obstructive lesions. HTN: Well-controlled PPM: Normal device function.  Make sure he continues to get downloads every 3 months. Ao aneurysm: Check an echocardiogram to avoid exposure to contrast.  If there is suggestion of aortic root/ascending aorta dilation beyond 38-39 mm, he should undergo CT angiography. CKD stage 2-3: seeing Dr. Moshe Cipro.  Most recent creatinine improved at 1.33, GFR almost 60.    Medication Adjustments/Labs and Tests Ordered: Current medicines are reviewed at length with the patient today.  Concerns regarding medicines are outlined above.  Medication changes, Labs and Tests ordered today are listed in the Patient  Instructions below. Patient Instructions  Medication Instructions:  No changes *If you need a refill on your cardiac medications before your next appointment, please call your pharmacy*  Procedures: Your physician has requested that you have an echocardiogram. Echocardiography is a painless test that uses sound waves to create images of your heart. It provides your doctor with information about the size and shape of your heart and how well your heart's chambers and valves are working. This procedure takes approximately one hour. There are no restrictions for this procedure. Please do NOT wear cologne, perfume, aftershave, or lotions (deodorant is allowed). Please arrive 15 minutes prior to your appointment time.   Follow-Up: At [facility], you and your health needs are our priority.  As part of our continuing mission to provide you with exceptional heart care, we have created designated Provider Care Teams.  These Care Teams include your primary Cardiologist (physician) and Advanced Practice Providers (APPs -  Physician Assistants and Nurse Practitioners) who all work together to provide you with the care you need, when you need it.  We recommend signing up for the patient portal called "MyChart".  Sign up information is provided on this After Visit Summary.  MyChart is used to connect with patients for Virtual Visits (Telemedicine).  Patients are able to view lab/test results, encounter notes, upcoming appointments, etc.  Non-urgent messages can be sent to your provider as well.   To learn more about what you can do with MyChart, go to [email or web address].    Your next appointment:   1 year(s)- Phys Pacer check  Provider:   Sanda Klein, MD         Signed, Sanda Klein, MD  04/20/2022 10:34 AM    [address], [place], [place]  [address] Phone: [phone]; Fax: [phone]

## 2022-04-20 NOTE — Patient Instructions (Addendum)
Medication Instructions:  No changes *If you need a refill on your cardiac medications before your next appointment, please call your pharmacy*  Procedures: Your physician has requested that you have an echocardiogram. Echocardiography is a painless test that uses sound waves to create images of your heart. It provides your doctor with information about the size and shape of your heart and how well your heart's chambers and valves are working. This procedure takes approximately one hour. There are no restrictions for this procedure. Please do NOT wear cologne, perfume, aftershave, or lotions (deodorant is allowed). Please arrive 15 minutes prior to your appointment time.   Follow-Up: At [facility], you and your health needs are our priority.  As part of our continuing mission to provide you with exceptional heart care, we have created designated Provider Care Teams.  These Care Teams include your primary Cardiologist (physician) and Advanced Practice Providers (APPs -  Physician Assistants and Nurse Practitioners) who all work together to provide you with the care you need, when you need it.  We recommend signing up for the patient portal called "MyChart".  Sign up information is provided on this After Visit Summary.  MyChart is used to connect with patients for Virtual Visits (Telemedicine).  Patients are able to view lab/test results, encounter notes, upcoming appointments, etc.  Non-urgent messages can be sent to your provider as well.   To learn more about what you can do with MyChart, go to [email or web address].    Your next appointment:   1 year(s)- Phys Pacer check  Provider:   Sanda Klein, MD

## 2022-05-14 ENCOUNTER — Ambulatory Visit (HOSPITAL_COMMUNITY): Payer: Medicare HMO | Attending: Cardiology

## 2022-05-14 DIAGNOSIS — I358 Other nonrheumatic aortic valve disorders: Secondary | ICD-10-CM | POA: Diagnosis not present

## 2022-05-14 DIAGNOSIS — I7121 Aneurysm of the ascending aorta, without rupture: Secondary | ICD-10-CM | POA: Insufficient documentation

## 2022-05-14 LAB — ECHOCARDIOGRAM COMPLETE
Area-P 1/2: 3.42 cm2
S' Lateral: 2.6 cm

## 2022-05-20 NOTE — Progress Notes (Signed)
Remote pacemaker transmission.   

## 2022-05-27 DIAGNOSIS — Z6830 Body mass index (BMI) 30.0-30.9, adult: Secondary | ICD-10-CM | POA: Diagnosis not present

## 2022-05-27 DIAGNOSIS — R5383 Other fatigue: Secondary | ICD-10-CM | POA: Diagnosis not present

## 2022-05-27 DIAGNOSIS — N183 Chronic kidney disease, stage 3 unspecified: Secondary | ICD-10-CM | POA: Diagnosis not present

## 2022-05-27 DIAGNOSIS — I129 Hypertensive chronic kidney disease with stage 1 through stage 4 chronic kidney disease, or unspecified chronic kidney disease: Secondary | ICD-10-CM | POA: Diagnosis not present

## 2022-05-28 LAB — LAB REPORT - SCANNED
Albumin, Urine POC: 3.4
Microalb Creat Ratio: 7

## 2022-07-20 ENCOUNTER — Ambulatory Visit: Payer: Medicare HMO

## 2022-07-20 DIAGNOSIS — I495 Sick sinus syndrome: Secondary | ICD-10-CM | POA: Diagnosis not present

## 2022-07-21 LAB — CUP PACEART REMOTE DEVICE CHECK
Battery Impedance: 1741 Ohm
Battery Remaining Longevity: 46 mo
Battery Voltage: 2.76 V
Brady Statistic AP VP Percent: 0 %
Brady Statistic AP VS Percent: 99 %
Brady Statistic AS VP Percent: 0 %
Brady Statistic AS VS Percent: 1 %
Date Time Interrogation Session: 20240624140540
Implantable Lead Connection Status: 753985
Implantable Lead Connection Status: 753985
Implantable Lead Implant Date: 20050106
Implantable Lead Implant Date: 20131122
Implantable Lead Location: 753859
Implantable Lead Location: 753860
Implantable Lead Model: 5092
Implantable Lead Model: 5594
Implantable Pulse Generator Implant Date: 20131122
Lead Channel Impedance Value: 428 Ohm
Lead Channel Impedance Value: 548 Ohm
Lead Channel Pacing Threshold Amplitude: 0.5 V
Lead Channel Pacing Threshold Amplitude: 0.625 V
Lead Channel Pacing Threshold Pulse Width: 0.4 ms
Lead Channel Pacing Threshold Pulse Width: 0.4 ms
Lead Channel Setting Pacing Amplitude: 1.5 V
Lead Channel Setting Pacing Amplitude: 2 V
Lead Channel Setting Pacing Pulse Width: 0.4 ms
Lead Channel Setting Sensing Sensitivity: 2.8 mV
Zone Setting Status: 755011
Zone Setting Status: 755011

## 2022-08-05 DIAGNOSIS — K1120 Sialoadenitis, unspecified: Secondary | ICD-10-CM | POA: Diagnosis not present

## 2022-08-10 NOTE — Progress Notes (Signed)
 Remote pacemaker transmission.   

## 2022-08-27 ENCOUNTER — Ambulatory Visit (INDEPENDENT_AMBULATORY_CARE_PROVIDER_SITE_OTHER): Payer: Medicare HMO | Admitting: Nurse Practitioner

## 2022-08-27 ENCOUNTER — Encounter: Payer: Self-pay | Admitting: Nurse Practitioner

## 2022-08-27 VITALS — BP 136/80 | HR 68 | Temp 97.6°F | Ht 71.0 in | Wt 204.4 lb

## 2022-08-27 DIAGNOSIS — Z0000 Encounter for general adult medical examination without abnormal findings: Secondary | ICD-10-CM | POA: Diagnosis not present

## 2022-08-27 DIAGNOSIS — E663 Overweight: Secondary | ICD-10-CM

## 2022-08-27 DIAGNOSIS — N183 Chronic kidney disease, stage 3 unspecified: Secondary | ICD-10-CM | POA: Diagnosis not present

## 2022-08-27 DIAGNOSIS — R5382 Chronic fatigue, unspecified: Secondary | ICD-10-CM | POA: Diagnosis not present

## 2022-08-27 DIAGNOSIS — H6122 Impacted cerumen, left ear: Secondary | ICD-10-CM | POA: Diagnosis not present

## 2022-08-27 DIAGNOSIS — Z79899 Other long term (current) drug therapy: Secondary | ICD-10-CM | POA: Diagnosis not present

## 2022-08-27 DIAGNOSIS — E559 Vitamin D deficiency, unspecified: Secondary | ICD-10-CM | POA: Diagnosis not present

## 2022-08-27 DIAGNOSIS — R7309 Other abnormal glucose: Secondary | ICD-10-CM

## 2022-08-27 DIAGNOSIS — R39198 Other difficulties with micturition: Secondary | ICD-10-CM | POA: Insufficient documentation

## 2022-08-27 DIAGNOSIS — Z6828 Body mass index (BMI) 28.0-28.9, adult: Secondary | ICD-10-CM | POA: Diagnosis not present

## 2022-08-27 DIAGNOSIS — I129 Hypertensive chronic kidney disease with stage 1 through stage 4 chronic kidney disease, or unspecified chronic kidney disease: Secondary | ICD-10-CM

## 2022-08-27 DIAGNOSIS — I2510 Atherosclerotic heart disease of native coronary artery without angina pectoris: Secondary | ICD-10-CM | POA: Diagnosis not present

## 2022-08-27 DIAGNOSIS — Z950 Presence of cardiac pacemaker: Secondary | ICD-10-CM

## 2022-08-27 LAB — POCT URINALYSIS DIP (CLINITEK)
Bilirubin, UA: NEGATIVE
Blood, UA: NEGATIVE
Glucose, UA: NEGATIVE mg/dL
Ketones, POC UA: NEGATIVE mg/dL
Nitrite, UA: NEGATIVE
POC PROTEIN,UA: NEGATIVE
Spec Grav, UA: 1.025 (ref 1.010–1.025)
Urobilinogen, UA: 0.2 E.U./dL
pH, UA: 6.5 (ref 5.0–8.0)

## 2022-08-27 MED ORDER — ATORVASTATIN CALCIUM 10 MG PO TABS
ORAL_TABLET | ORAL | 1 refills | Status: DC
Start: 2022-08-27 — End: 2023-01-15

## 2022-08-27 MED ORDER — AMLODIPINE BESYLATE 10 MG PO TABS
10.0000 mg | ORAL_TABLET | Freq: Every day | ORAL | 1 refills | Status: DC
Start: 2022-08-27 — End: 2023-03-01

## 2022-08-27 MED ORDER — CANDESARTAN CILEXETIL-HCTZ 32-25 MG PO TABS
1.0000 | ORAL_TABLET | Freq: Every day | ORAL | 1 refills | Status: DC
Start: 2022-08-27 — End: 2023-03-01

## 2022-08-27 NOTE — Assessment & Plan Note (Signed)
Will check vitamin D level and supplement as needed.    Also encouraged to spend 15 minutes in the sun daily.   

## 2022-08-27 NOTE — Assessment & Plan Note (Signed)
Behavior modifications discussed and diet history reviewed.   Pt will continue to exercise regularly and modify diet with low GI, plant based foods and decrease intake of processed foods.  Recommend intake of daily multivitamin, Vitamin D, and calcium.  Recommend colonoscopy for preventive screenings, as well as recommend immunizations that include influenza, TDAP, and Shingles ( all up-to-date)

## 2022-08-27 NOTE — Assessment & Plan Note (Signed)
He has had a full workup for his chronic fatigue.  He feels it is related to his pacemaker and plans to contact his cardiologist.  No abnormal findings on physical exam

## 2022-08-27 NOTE — Assessment & Plan Note (Signed)
Continue f/u with Cardiology.

## 2022-08-27 NOTE — Assessment & Plan Note (Signed)
Continue f/u with Cardiology and your pacemaker checks

## 2022-08-27 NOTE — Assessment & Plan Note (Signed)
Blood pressure slightly elevated.  Will repeat.  Continue focusing on lifestyle modifications.

## 2022-08-27 NOTE — Assessment & Plan Note (Signed)
Congratulated on his 5 lb weight loss

## 2022-08-27 NOTE — Progress Notes (Signed)
[name], CMA,acting as a Neurosurgeon for [name], FNP.,have documented all relevant documentation on the behalf of [name], FNP,as directed by  [name], FNP while in the presence of [name], FNP.  Subjective:   Patient ID: Aaron Snyder , male    DOB: [birth year] , 72 y.o.   MRN: [id number]  Chief Complaint  Patient presents with   Annual Exam    HPI  Patient presents today for HM, Patient reports compliance with medications. Patient denies any chest pain, SOB, or headaches. Patient has no other concerns today.   BP Readings from Last 3 Encounters: 08/27/22 : (!) 140/80 04/20/22 : 120/82 03/05/22 : 122/80   Wt Readings from Last 3 Encounters: 08/27/22 : 204 lb 6.4 oz (92.7 kg) 04/20/22 : 209 lb 6.4 oz (95 kg) 03/05/22 : 206 lb (93.4 kg)  He reports having more fatigue and he thinks it is related to his pacemaker. He will call his cardiologist to be seen     Past Medical History:  Diagnosis Date   Atypical chest pain 11/24/2013   Chest pain 12/14/2011   Coronary artery disease    Dysphagia    Dyspnea on exertion 04/04/2019   Head injury    Hypertension    Pacemaker generator end of life 12/18/2011   Medtronic   Pleuritic chest pain 10/15/2013   Presence of permanent cardiac pacemaker 02/01/2003   Medtronic   Syncope      Family History  Problem Relation Age of Onset   Diabetes Father    Hypertension Father      Current Outpatient Medications:    aspirin EC 81 MG tablet, Take 81 mg by mouth daily., Disp: , Rfl:    cholecalciferol (VITAMIN D3) 25 MCG (1000 UNIT) tablet, Take 1,000 Units by mouth daily., Disp: , Rfl:    loratadine (CLARITIN) 10 MG tablet, Take 10 mg by mouth daily., Disp: , Rfl:    Multiple Vitamins-Minerals (MULTIVITAMIN WITH MINERALS) tablet, Take 1 tablet by mouth daily., Disp: , Rfl:    amLODipine (NORVASC) 10 MG tablet, Take 1 tablet (10 mg total) by mouth daily., Disp: 90 tablet, Rfl: 1   atorvastatin (LIPITOR) 10  MG tablet, Take 1 tab by mouth MWF, Disp: 45 tablet, Rfl: 1   Candesartan Cilexetil-HCTZ 32-25 MG TABS, Take 1 tablet by mouth daily., Disp: 90 tablet, Rfl: 1   TURMERIC PO, Take 1 tablet by mouth daily. (Patient not taking: Reported on 08/27/2022), Disp: , Rfl:    Zinc 50 MG CAPS, Take 1 capsule by mouth daily. (Patient not taking: Reported on 08/27/2022), Disp: , Rfl:    Allergies  Allergen Reactions   Apple Juice Anaphylaxis   Carrot [Daucus Carota] Anaphylaxis   Iodinated Contrast Media Shortness Of Breath and Swelling    PER PATIENT BREAK THROUGH REACTION WITH PRE MEDS-2016-----Can have contrast as long as he has a 13 prep   Iohexol Shortness Of Breath and Swelling    Pt requires 13hr premeds.   Oat Anaphylaxis   Soy Allergy Anaphylaxis   Cow's Milk [Milk (Cow)]     Hives, and upset stomach      Men's preventive visit. Patient Health Questionnaire (PHQ-2) is  Flowsheet Row Office Visit from 08/27/2022 in [facility]  PHQ-2 Total Score 6     Patient is on a Regular diet. He is eating a lot of broccoli and cauliflower but has been giving him gas. Exercise with walking 2-3 miles a day  and strength training for 20 minutes and 20 minutes on the elliptical 5 days a week. Marital status: Widowed. Relevant history for alcohol use is:  Social History   Substance and Sexual Activity  Alcohol Use No   Relevant history for tobacco use is:  Social History   Tobacco Use  Smoking Status Former   Current packs/day: 0.00   Average packs/day: 1 pack/day for 15.0 years (15.0 ttl pk-yrs)   Types: Cigarettes   Start date: 03/15/1981   Quit date: 03/15/1996   Years since quitting: 26.4  Smokeless Tobacco Never  .   Review of Systems  Constitutional:  Positive for fatigue.  HENT: Negative.    Eyes: Negative.   Respiratory: Negative.    Cardiovascular: Negative.   Gastrointestinal: Negative.   Endocrine: Negative.   Genitourinary: Negative.    Musculoskeletal: Negative.   Skin: Negative.   Allergic/Immunologic: Negative.   Neurological: Negative.   Hematological: Negative.   Psychiatric/Behavioral: Negative.       Today's Vitals   08/27/22 0845 08/27/22 0933  BP: (!) 140/80 136/80  Pulse: 68   Temp: 97.6 F (36.4 C)   TempSrc: Oral   Weight: 204 lb 6.4 oz (92.7 kg)   Height: 5\' 11"  (1.803 m)   PainSc: 10-Worst pain ever   PainLoc: Back    Body mass index is 28.51 kg/m.  Wt Readings from Last 3 Encounters:  08/27/22 204 lb 6.4 oz (92.7 kg)  04/20/22 209 lb 6.4 oz (95 kg)  03/05/22 206 lb (93.4 kg)    Objective:  Physical Exam Vitals reviewed.  Constitutional:      General: He is not in acute distress.    Appearance: Normal appearance. He is normal weight. He is not ill-appearing.  HENT:     Head: Normocephalic and atraumatic.     Right Ear: Tympanic membrane, ear canal and external ear normal. There is no impacted cerumen.     Left Ear: External ear normal. There is impacted cerumen (Excessive cerumen to canal).     Nose: Nose normal.     Mouth/Throat:     Mouth: Mucous membranes are moist.  Eyes:     Extraocular Movements: Extraocular movements intact.     Conjunctiva/sclera: Conjunctivae normal.     Pupils: Pupils are equal, round, and reactive to light.  Cardiovascular:     Rate and Rhythm: Normal rate and regular rhythm.     Pulses: Normal pulses.     Heart sounds: Normal heart sounds. No murmur heard. Pulmonary:     Effort: Pulmonary effort is normal. No respiratory distress.     Breath sounds: Normal breath sounds. No wheezing.  Abdominal:     General: Abdomen is flat. Bowel sounds are normal. There is no distension.     Palpations: Abdomen is soft.     Tenderness: There is no abdominal tenderness.  Genitourinary:    Comments: Will check PSA  Musculoskeletal:        General: No swelling or tenderness. Normal range of motion.     Cervical back: Normal range of motion and neck supple. No  muscular tenderness.  Skin:    General: Skin is warm and dry.     Capillary Refill: Capillary refill takes less than 2 seconds.     Findings: No lesion or rash.  Neurological:     General: No focal deficit present.     Mental Status: He is alert and oriented to person, place, and time.     Cranial Nerves:  No cranial nerve deficit.     Motor: No weakness.  Psychiatric:        Mood and Affect: Mood normal.        Behavior: Behavior normal.        Thought Content: Thought content normal.        Judgment: Judgment normal.         Assessment And Plan:    Encounter for annual health examination Assessment & Plan: Behavior modifications discussed and diet history reviewed.   Pt will continue to exercise regularly and modify diet with low GI, plant based foods and decrease intake of processed foods.  Recommend intake of daily multivitamin, Vitamin D, and calcium.  Recommend colonoscopy for preventive screenings, as well as recommend immunizations that include influenza, TDAP, and Shingles ( all up-to-date)    Abnormal glucose Assessment & Plan: HgbA1c is stable, continue focusing on healthy diet and exercise  Orders: -     Hemoglobin A1c  Benign hypertension with CKD (chronic kidney disease) stage III (HCC) Assessment & Plan: Blood pressure slightly elevated.  Will repeat.  Continue focusing on lifestyle modifications.  Orders: -     EKG 12-Lead -     POCT URINALYSIS DIP (CLINITEK) -     Microalbumin / creatinine urine ratio -     CMP14+EGFR -     amLODIPine Besylate; Take 1 tablet (10 mg total) by mouth daily.  Dispense: 90 tablet; Refill: 1 -     Candesartan Cilexetil-HCTZ; Take 1 tablet by mouth daily.  Dispense: 90 tablet; Refill: 1  Vitamin D deficiency Assessment & Plan: Will check vitamin D level and supplement as needed.    Also encouraged to spend 15 minutes in the sun daily.    Orders: -     VITAMIN D 25 Hydroxy (Vit-D Deficiency, Fractures)  Chronic  fatigue Assessment & Plan: He has had a full workup for his chronic fatigue.  He feels it is related to his pacemaker and plans to contact his cardiologist.  No abnormal findings on physical exam   Urinary stream slowing Assessment & Plan: Will check PSA level.   Orders: -     PSA  Overweight with body mass index (BMI) of 28 to 28.9 in adult Assessment & Plan: Congratulated on his 5 lb weight loss   Pacemaker - dual chamber Medtronic 2004, new generator 2013 Assessment & Plan: Continue f/u with Cardiology and your pacemaker checks   Impacted cerumen of left ear Assessment & Plan: Left ear with soft dark cerumen, water lavage done with good results.   Orders: -     Ear Lavage  Coronary artery disease involving native coronary artery of native heart without angina pectoris Assessment & Plan: Continue f/u with Cardiology  Orders: -     Lipid panel -     Atorvastatin Calcium; Take 1 tab by mouth MWF  Dispense: 45 tablet; Refill: 1  Other long term (current) drug therapy -     CBC with Differential/Platelet     Return for 1 year physical, 6 month bp check. Patient was given opportunity to ask questions. Patient verbalized understanding of the plan and was able to repeat key elements of the plan. All questions were answered to their satisfaction.   [name], FNP  I, [name], FNP, have reviewed all documentation for this visit. The documentation on 08/27/22 for the exam, diagnosis, procedures, and orders are all accurate and complete.

## 2022-08-27 NOTE — Assessment & Plan Note (Signed)
HgbA1c is stable, continue focusing on healthy diet and exercise

## 2022-08-27 NOTE — Assessment & Plan Note (Signed)
Left ear with soft dark cerumen, water lavage done with good results.

## 2022-08-27 NOTE — Assessment & Plan Note (Signed)
Will check PSA level.

## 2022-10-16 ENCOUNTER — Emergency Department (HOSPITAL_BASED_OUTPATIENT_CLINIC_OR_DEPARTMENT_OTHER): Payer: Medicare HMO

## 2022-10-16 ENCOUNTER — Other Ambulatory Visit: Payer: Self-pay

## 2022-10-16 ENCOUNTER — Emergency Department (HOSPITAL_BASED_OUTPATIENT_CLINIC_OR_DEPARTMENT_OTHER)
Admission: EM | Admit: 2022-10-16 | Discharge: 2022-10-16 | Disposition: A | Discharge status: Home / Self Care | Payer: Medicare HMO | Attending: Emergency Medicine | Admitting: Emergency Medicine

## 2022-10-16 DIAGNOSIS — Z7982 Long term (current) use of aspirin: Secondary | ICD-10-CM | POA: Insufficient documentation

## 2022-10-16 DIAGNOSIS — R42 Dizziness and giddiness: Secondary | ICD-10-CM | POA: Diagnosis not present

## 2022-10-16 DIAGNOSIS — S0990XA Unspecified injury of head, initial encounter: Secondary | ICD-10-CM | POA: Insufficient documentation

## 2022-10-16 DIAGNOSIS — Z79899 Other long term (current) drug therapy: Secondary | ICD-10-CM | POA: Insufficient documentation

## 2022-10-16 DIAGNOSIS — Y92410 Unspecified street and highway as the place of occurrence of the external cause: Secondary | ICD-10-CM | POA: Insufficient documentation
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
## 2022-10-19 ENCOUNTER — Ambulatory Visit (INDEPENDENT_AMBULATORY_CARE_PROVIDER_SITE_OTHER): Payer: Medicare HMO

## 2022-10-19 DIAGNOSIS — I495 Sick sinus syndrome: Secondary | ICD-10-CM

## 2022-10-20 LAB — CUP PACEART REMOTE DEVICE CHECK
Battery Impedance: 1777 Ohm
Battery Remaining Longevity: 45 mo
Battery Voltage: 2.75 V
Brady Statistic AP VP Percent: 0 %
Brady Statistic AP VS Percent: 98 %
Brady Statistic AS VP Percent: 0 %
Brady Statistic AS VS Percent: 2 %
Date Time Interrogation Session: 20240923071231
Implantable Lead Connection Status: 753985
Implantable Lead Connection Status: 753985
Implantable Lead Implant Date: 20050106
Implantable Lead Implant Date: 20131122
Implantable Lead Location: 753859
Implantable Lead Location: 753860
Implantable Lead Model: 5092
Implantable Lead Model: 5594
Implantable Pulse Generator Implant Date: 20131122
Lead Channel Impedance Value: 411 Ohm
Lead Channel Impedance Value: 544 Ohm
Lead Channel Pacing Threshold Amplitude: 0.5 V
Lead Channel Pacing Threshold Amplitude: 0.625 V
Lead Channel Pacing Threshold Pulse Width: 0.4 ms
Lead Channel Pacing Threshold Pulse Width: 0.4 ms
Lead Channel Setting Pacing Amplitude: 1.5 V
Lead Channel Setting Pacing Amplitude: 2 V
Lead Channel Setting Pacing Pulse Width: 0.4 ms
Lead Channel Setting Sensing Sensitivity: 2.8 mV
Zone Setting Status: 755011
Zone Setting Status: 755011

## 2022-11-03 NOTE — Progress Notes (Signed)
Remote pacemaker transmission.   

## 2022-11-19 ENCOUNTER — Telehealth: Payer: Self-pay

## 2022-11-19 NOTE — Telephone Encounter (Signed)
Transition Care Management Unsuccessful Follow-up Telephone Call  Date of discharge and from where:  10/16/2022 [facility]  Attempts:  2nd Attempt  Reason for unsuccessful TCM follow-up call:  No answer/busy  Leelynn Whetsel [facility]  [facility] [address] Guide Direct Dial: [phone]  Website: [email or web address]

## 2022-11-19 NOTE — Telephone Encounter (Signed)
Transition Care Management Unsuccessful Follow-up Telephone Call  Date of discharge and from where:  10/16/2022 [facility].  Attempts:  1st Attempt  Reason for unsuccessful TCM follow-up call:  Left voice message  Kylan Veach [facility]  [facility], [facility] Guide Direct Dial: [phone]  Website: [email or web address]

## 2023-01-15 ENCOUNTER — Other Ambulatory Visit: Payer: Self-pay | Admitting: Nurse Practitioner

## 2023-01-15 DIAGNOSIS — I2510 Atherosclerotic heart disease of native coronary artery without angina pectoris: Secondary | ICD-10-CM

## 2023-01-18 ENCOUNTER — Ambulatory Visit (INDEPENDENT_AMBULATORY_CARE_PROVIDER_SITE_OTHER): Payer: Medicare HMO

## 2023-01-18 DIAGNOSIS — I495 Sick sinus syndrome: Secondary | ICD-10-CM

## 2023-01-21 LAB — CUP PACEART REMOTE DEVICE CHECK
Battery Impedance: 1800 Ohm
Battery Remaining Longevity: 45 mo
Battery Voltage: 2.74 V
Brady Statistic AP VP Percent: 0 %
Brady Statistic AP VS Percent: 98 %
Brady Statistic AS VP Percent: 0 %
Brady Statistic AS VS Percent: 2 %
Date Time Interrogation Session: 20241226105823
Implantable Lead Connection Status: 753985
Implantable Lead Connection Status: 753985
Implantable Lead Implant Date: 20050106
Implantable Lead Implant Date: 20131122
Implantable Lead Location: 753859
Implantable Lead Location: 753860
Implantable Lead Model: 5092
Implantable Lead Model: 5594
Implantable Pulse Generator Implant Date: 20131122
Lead Channel Impedance Value: 438 Ohm
Lead Channel Impedance Value: 540 Ohm
Lead Channel Pacing Threshold Amplitude: 0.5 V
Lead Channel Pacing Threshold Amplitude: 0.625 V
Lead Channel Pacing Threshold Pulse Width: 0.4 ms
Lead Channel Pacing Threshold Pulse Width: 0.4 ms
Lead Channel Setting Pacing Amplitude: 1.5 V
Lead Channel Setting Pacing Amplitude: 2 V
Lead Channel Setting Pacing Pulse Width: 0.4 ms
Lead Channel Setting Sensing Sensitivity: 2.8 mV
Zone Setting Status: 755011
Zone Setting Status: 755011

## 2023-03-01 ENCOUNTER — Ambulatory Visit: Payer: Medicare HMO | Admitting: Nurse Practitioner

## 2023-03-01 ENCOUNTER — Encounter: Payer: Self-pay | Admitting: Nurse Practitioner

## 2023-03-01 VITALS — BP 130/70 | HR 77 | Temp 97.7°F | Ht 71.0 in | Wt 207.4 lb

## 2023-03-01 DIAGNOSIS — R7309 Other abnormal glucose: Secondary | ICD-10-CM

## 2023-03-01 DIAGNOSIS — Z6828 Body mass index (BMI) 28.0-28.9, adult: Secondary | ICD-10-CM | POA: Diagnosis not present

## 2023-03-01 DIAGNOSIS — I495 Sick sinus syndrome: Secondary | ICD-10-CM | POA: Insufficient documentation

## 2023-03-01 DIAGNOSIS — E663 Overweight: Secondary | ICD-10-CM | POA: Diagnosis not present

## 2023-03-01 DIAGNOSIS — E559 Vitamin D deficiency, unspecified: Secondary | ICD-10-CM

## 2023-03-01 DIAGNOSIS — I129 Hypertensive chronic kidney disease with stage 1 through stage 4 chronic kidney disease, or unspecified chronic kidney disease: Secondary | ICD-10-CM | POA: Diagnosis not present

## 2023-03-01 DIAGNOSIS — Z2821 Immunization not carried out because of patient refusal: Secondary | ICD-10-CM | POA: Diagnosis not present

## 2023-03-01 DIAGNOSIS — N183 Chronic kidney disease, stage 3 unspecified: Secondary | ICD-10-CM

## 2023-03-01 MED ORDER — CANDESARTAN CILEXETIL-HCTZ 32-25 MG PO TABS: 1.0000 | ORAL_TABLET | Freq: Every day | ORAL | 1 refills | Status: DC

## 2023-03-01 MED ORDER — AMLODIPINE BESYLATE 10 MG PO TABS: 10.0000 mg | ORAL_TABLET | Freq: Every day | ORAL | 1 refills | Status: DC
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
## 2023-03-01 NOTE — Assessment & Plan Note (Signed)
 Will check vitamin D level and supplement as needed.    Also encouraged to spend 15 minutes in the sun daily.

## 2023-03-01 NOTE — Assessment & Plan Note (Signed)
HgbA1c is stable, continue focusing on healthy diet and exercise

## 2023-03-01 NOTE — Assessment & Plan Note (Signed)
Blood pressure is well controlled, continue current medications.  Continue focusing on lifestyle modifications.

## 2023-03-01 NOTE — Addendum Note (Signed)
Addended by: Geralyn Flash D on: 03/01/2023 11:11 AM   Modules accepted: Orders

## 2023-03-01 NOTE — Progress Notes (Signed)
 Remote pacemaker transmission.

## 2023-03-01 NOTE — Assessment & Plan Note (Signed)
 Declines covid 19 vaccine. Discussed risk of covid 63 and if he changes her mind about the vaccine to call the office. Education has been provided regarding the importance of this vaccine but patient still declined. Advised may receive this vaccine at local pharmacy or Health Dept.or vaccine clinic. Aware to provide a copy of the vaccination record if obtained from local pharmacy or Health Dept.  Encouraged to take multivitamin, vitamin d, vitamin c and zinc to increase immune system. Aware can call office if would like to have vaccine here at office. Verbalized acceptance and understanding.

## 2023-03-02 LAB — BMP8+EGFR
BUN/Creatinine Ratio: 15 (ref 10–24)
BUN: 23 mg/dL (ref 8–27)
CO2: 24 mmol/L (ref 20–29)
Calcium: 9.5 mg/dL (ref 8.6–10.2)
Chloride: 101 mmol/L (ref 96–106)
Creatinine, Ser: 1.52 mg/dL — ABNORMAL HIGH (ref 0.76–1.27)
Glucose: 89 mg/dL (ref 70–99)
Potassium: 3.9 mmol/L (ref 3.5–5.2)
Sodium: 141 mmol/L (ref 134–144)
eGFR: 48 mL/min/{1.73_m2} — ABNORMAL LOW (ref >59)

## 2023-03-02 LAB — HEMOGLOBIN A1C
Est. average glucose Bld gHb Est-mCnc: 123 mg/dL
Hgb A1c MFr Bld: 5.9 % — ABNORMAL HIGH (ref 4.8–5.6)

## 2023-03-02 LAB — MICROALBUMIN / CREATININE URINE RATIO
Creatinine, Urine: 84.5 mg/dL
Microalb/Creat Ratio: 8 mg/g{creat} (ref 0–29)
Microalbumin, Urine: 6.5 ug/mL

## 2023-03-02 LAB — LIPID PANEL
Chol/HDL Ratio: 2.6 {ratio} (ref 0.0–5.0)
Cholesterol, Total: 148 mg/dL (ref 100–199)
HDL: 56 mg/dL (ref >39)
LDL Chol Calc (NIH): 78 mg/dL (ref 0–99)
Triglycerides: 69 mg/dL (ref 0–149)
VLDL Cholesterol Cal: 14 mg/dL (ref 5–40)

## 2023-03-03 ENCOUNTER — Encounter: Payer: Self-pay | Admitting: Nurse Practitioner

## 2023-03-23 IMAGING — CT CT L SPINE W/O CM
1 of 7 series · 6 of 14 positions shown, 8 images · non-contrast
Comparison: Myelogram 03/16/2011

CLINICAL DATA: Neurogenic claudication

EXAM:
CT LUMBAR SPINE WITHOUT CONTRAST
TECHNIQUE: Multidetector CT imaging of the lumbar spine was performed without
intravenous contrast administration. Multiplanar CT image
reconstructions were also generated.

[Series 3: l spine soft · axial · 0.38mm/px · z∈[-254,-104]mm · 6 of 106 slices shown, 8 images]
[im 16/106  soft-tissue]
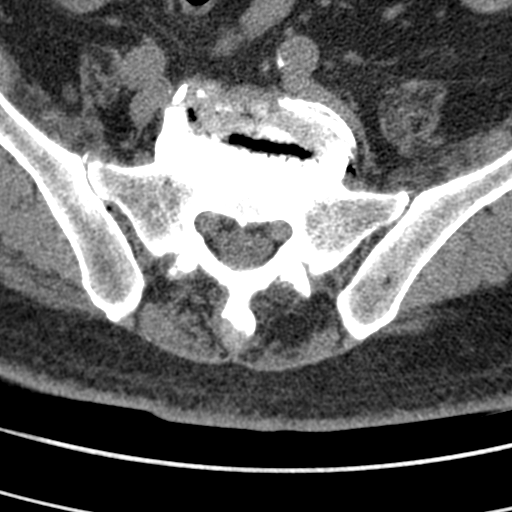
[im 16/106  bone]
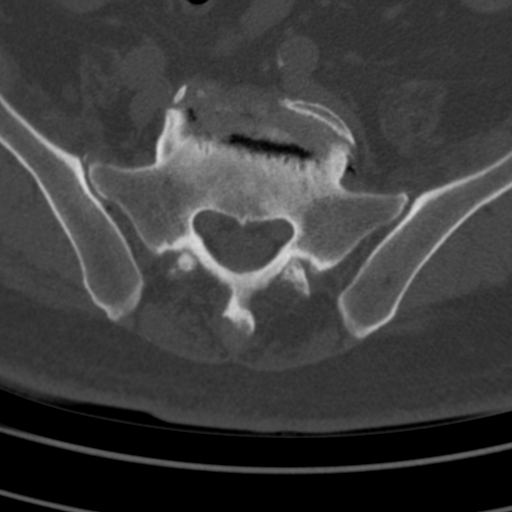
[im 31/106  bone]
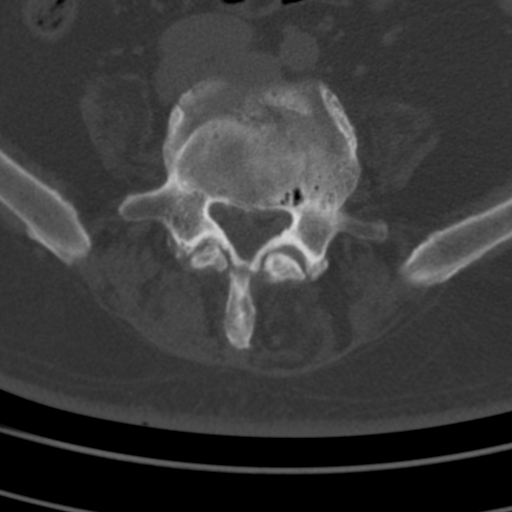
[im 46/106  bone]
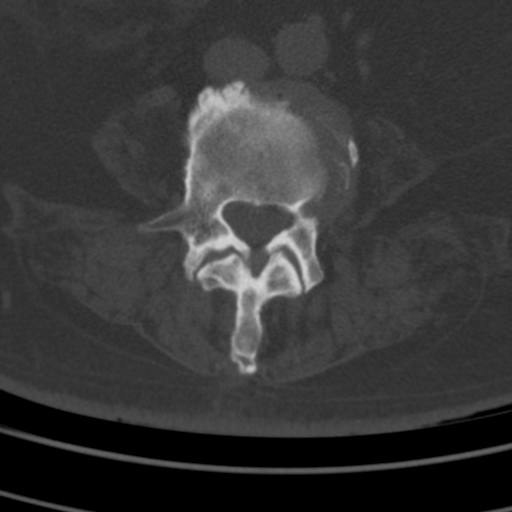
[im 61/106  bone]
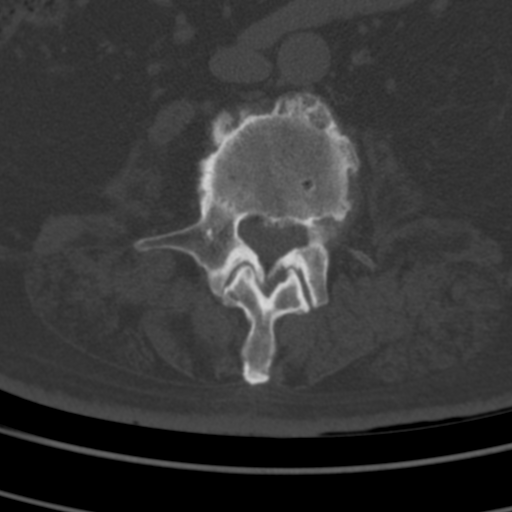
[im 76/106  soft-tissue]
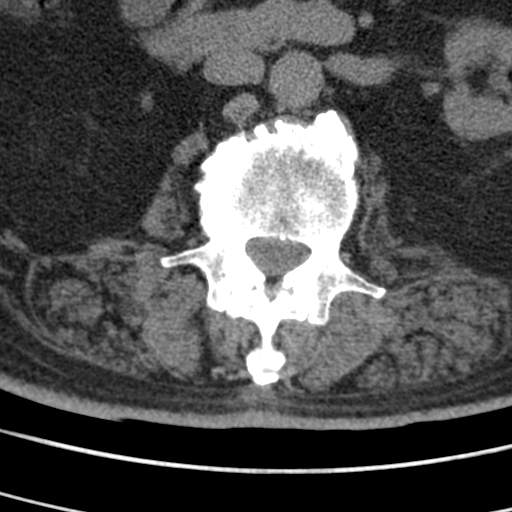
[im 76/106  bone]
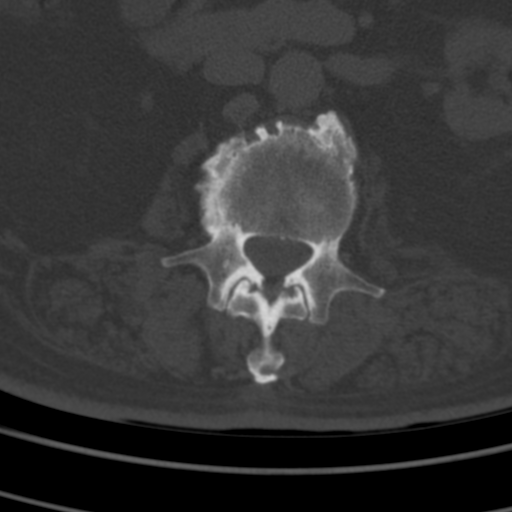
[im 91/106  bone]
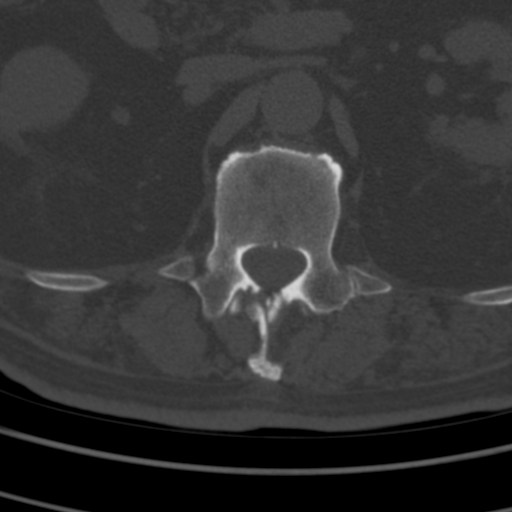

[6 of 14 positions shown; findings below may reference images not displayed]

FINDINGS: Segmentation: 5 lumbar type vertebrae.

Alignment: Straightening

Vertebrae: No acute fracture or focal pathologic process.

Paraspinal and other soft tissues: Generalized muscular

Disc levels:

T12- L1: Spondylosis

L1-L2: Spondylosis with mild right foraminal narrowing

L2-L3: Disc collapse and ridging with moderate to advanced spinal
stenosis. Right more than left foraminal impingement

L3-L4: Disc collapse with bulging and ridging. Endplate degeneration
eccentric to the right. Advanced spinal and right foraminal
stenosis.

L4-L5: Disc collapse with bulging and ridging. Endplate degeneration
eccentric to the right. Advanced spinal stenosis. Left more than
right foraminal impingement

L5-S1:Disc collapse and endplate degeneration with ridging and facet
spurring. Mild spinal stenosis. Biforaminal moderate impingement.
IMPRESSION: 1. Advanced and diffuse lumbar spine degeneration with progression
from 4159. Spinal stenosis at L2-3 to L4-5, advanced at L3-4 and
L4-5.
2. Multilevel foraminal impingement as described.

## 2023-03-24 ENCOUNTER — Ambulatory Visit (INDEPENDENT_AMBULATORY_CARE_PROVIDER_SITE_OTHER): Payer: Medicare HMO

## 2023-03-24 DIAGNOSIS — Z1211 Encounter for screening for malignant neoplasm of colon: Secondary | ICD-10-CM

## 2023-03-24 DIAGNOSIS — Z0000 Encounter for general adult medical examination without abnormal findings: Secondary | ICD-10-CM | POA: Diagnosis not present

## 2023-03-24 NOTE — Progress Notes (Signed)
 Subjective:   Aaron Snyder is a 73 y.o. who presents for a Medicare Wellness preventive visit.  Visit Complete: Virtual I connected with  Alben Deeds on 03/24/23 by a audio enabled telemedicine application and verified that I am speaking with the correct person using two identifiers.  Patient Location: Home  Provider Location: Office/Clinic  I discussed the limitations of evaluation and management by telemedicine. The patient expressed understanding and agreed to proceed.  Vital Signs: Because this visit was a virtual/telehealth visit, some criteria may be missing or patient reported. Any vitals not documented were not able to be obtained and vitals that have been documented are patient reported.  VideoError- Librarian, academic were attempted between this provider and patient, however failed, due to patient having technical difficulties OR patient did not have access to video capability.  We continued and completed visit with audio only.   AWV Questionnaire: No: Patient Medicare AWV questionnaire was not completed prior to this visit.  Cardiac Risk Factors include: advanced age (>80men, >9 women);hypertension;male gender     Objective:    Today's Vitals   03/24/23 0931  PainSc: 8    There is no height or weight on file to calculate BMI.     03/24/2023    9:40 AM 10/16/2022    8:37 AM 03/05/2022   10:30 AM 02/20/2021    9:50 AM 02/15/2020    9:33 AM 02/09/2019    9:00 AM 02/03/2018   11:08 AM  Advanced Directives  Does Patient Have a Medical Advance Directive? No No No No No No No  Would patient like information on creating a medical advance directive? No - Patient declined No - Patient declined No - Patient declined No - Patient declined No - Patient declined Yes (MAU/Ambulatory/Procedural Areas - Information given) Yes (MAU/Ambulatory/Procedural Areas - Information given)    Current Medications (verified) Outpatient Encounter Medications  as of 03/24/2023  Medication Sig   amLODipine (NORVASC) 10 MG tablet Take 1 tablet (10 mg total) by mouth daily.   aspirin EC 81 MG tablet Take 81 mg by mouth daily.   atorvastatin (LIPITOR) 10 MG tablet TAKE 1 TABLET BY MOUTH ON MONDAY, WEDNESDAY, FRIDAYS   Candesartan Cilexetil-HCTZ 32-25 MG TABS Take 1 tablet by mouth daily.   cholecalciferol (VITAMIN D3) 25 MCG (1000 UNIT) tablet Take 1,000 Units by mouth daily.   loratadine (CLARITIN) 10 MG tablet Take 10 mg by mouth daily.   Multiple Vitamins-Minerals (MULTIVITAMIN WITH MINERALS) tablet Take 1 tablet by mouth daily.   TURMERIC PO Take 1 tablet by mouth daily. (Patient not taking: Reported on 08/27/2022)   Zinc 50 MG CAPS Take 1 capsule by mouth daily. (Patient not taking: Reported on 08/27/2022)   No facility-administered encounter medications on file as of 03/24/2023.    Allergies (verified) Apple juice, Carrot [daucus carota], Iodinated contrast media, Iohexol, Oat, Soy allergy (obsolete), and Cow's milk [milk (cow)]   History: Past Medical History:  Diagnosis Date   Atypical chest pain 11/24/2013   Chest pain 12/14/2011   Coronary artery disease    Dysphagia    Dyspnea on exertion 04/04/2019   Head injury    Hypertension    Pacemaker generator end of life 12/18/2011   Medtronic   Pleuritic chest pain 10/15/2013   Presence of permanent cardiac pacemaker 02/01/2003   Medtronic   Syncope    Past Surgical History:  Procedure Laterality Date   CARDIAC CATHETERIZATION N/A 12/26/2014   Procedure: Left Heart  Cath and Coronary Angiography;  Surgeon: Lennette Bihari, MD;  Location: [facility] INVASIVE CV LAB;  Service: Cardiovascular;  Laterality: N/A;   ESOPHAGOGASTRODUODENOSCOPY     [place]   LEFT AND RIGHT HEART CATHETERIZATION WITH CORONARY ANGIOGRAM N/A 12/17/2011   Procedure: LEFT AND RIGHT HEART CATHETERIZATION WITH CORONARY ANGIOGRAM;  Surgeon: Marykay Lex, MD;  Location: [facility] CATH LAB;  Service: Cardiovascular;  Laterality: N/A;    LOOP RECORDER IMPLANT/EXPLANT  2005   NM MYOCAR PERF WALL MOTION  01/2011   Negative   PACEMAKER GENERATOR CHANGE  12/18/2011   Medtronic   PACEMAKER INSERTION  2005   Medtronic   PERMANENT PACEMAKER GENERATOR CHANGE N/A 12/18/2011   Procedure: PERMANENT PACEMAKER GENERATOR CHANGE;  Surgeon: Marinus Maw, MD;  Location: [facility] CATH LAB;  Service: Cardiovascular;  Laterality: N/A;   TOTAL SHOULDER ARTHROPLASTY     Family History  Problem Relation Age of Onset   Diabetes Father    Hypertension Father    Social History   Socioeconomic History   Marital status: Widowed    Spouse name: Not on file   Number of children: Not on file   Years of education: Not on file   Highest education level: Not on file  Occupational History   Occupation: Disabled    Employer: UNEMPLOYED  Tobacco Use   Smoking status: Former    Current packs/day: 0.00    Average packs/day: 1 pack/day for 15.0 years (15.0 ttl pk-yrs)    Types: Cigarettes    Start date: 03/15/1981    Quit date: 03/15/1996    Years since quitting: 27.0   Smokeless tobacco: Never  Vaping Use   Vaping status: Never Used  Substance and Sexual Activity   Alcohol use: No   Drug use: Not Currently    Types: Marijuana   Sexual activity: Not Currently  Other Topics Concern   Not on file  Social History Narrative   Not on file   Social Drivers of Health   Financial Resource Strain: Low Risk  (03/24/2023)   Overall Financial Resource Strain (CARDIA)    Difficulty of Paying Living Expenses: Not hard at all  Food Insecurity: No Food Insecurity (03/24/2023)   Hunger Vital Sign    Worried About Running Out of Food in the Last Year: Never true    Ran Out of Food in the Last Year: Never true  Transportation Needs: No Transportation Needs (03/24/2023)   PRAPARE - Administrator, Civil Service (Medical): No    Lack of Transportation (Non-Medical): No  Physical Activity: Sufficiently Active (03/24/2023)   Exercise Vital Sign     Days of Exercise per Week: 7 days    Minutes of Exercise per Session: 90 min  Stress: No Stress Concern Present (03/24/2023)   [employer] of Occupational Health - Occupational Stress Questionnaire    Feeling of Stress : Only a little  Social Connections: Socially Isolated (03/24/2023)   Social Connection and Isolation Panel [NHANES]    Frequency of Communication with Friends and Family: More than three times a week    Frequency of Social Gatherings with Friends and Family: More than three times a week    Attends Religious Services: Never    Database administrator or Organizations: No    Attends Banker Meetings: Never    Marital Status: Widowed    Tobacco Counseling Counseling given: Not Answered    Clinical Intake:  Pre-visit preparation completed: Yes  Pain : 0-10 Pain  Score: 8  Pain Type: Chronic pain Pain Location: Back Pain Radiating Towards: also neck Pain Descriptors / Indicators: Aching Pain Onset: More than a month ago Pain Frequency: Constant     Nutritional Risks: Nausea/ vomitting/ diarrhea (nausea last night and this morning) Diabetes: No  How often do you need to have someone help you when you read instructions, pamphlets, or other written materials from your doctor or pharmacy?: 1 - Never  Interpreter Needed?: No  Information entered by :: NAllen LPN   Activities of Daily Living     03/24/2023    9:35 AM  In your present state of health, do you have any difficulty performing the following activities:  Hearing? 0  Vision? 1  Comment has floaters  Difficulty concentrating or making decisions? 0  Walking or climbing stairs? 0  Dressing or bathing? 0  Doing errands, shopping? 0  Preparing Food and eating ? N  Using the Toilet? N  In the past six months, have you accidently leaked urine? N  Do you have problems with loss of bowel control? N  Managing your Medications? N  Managing your Finances? N  Housekeeping or managing your  Housekeeping? N    Patient Care Team: [name], FNP as PCP - General (General Practice) Croitoru, Rachelle Hora, MD as PCP - Cardiology (Cardiology) Sallye Lat, MD as Consulting Physician (Ophthalmology)  Indicate any recent Medical Services you may have received from other than [facility] providers in the past year (date may be approximate).     Assessment:   This is a routine wellness examination for [place].  Hearing/Vision screen Hearing Screening - Comments:: Denies hearing issues Vision Screening - Comments:: No regular eye exams,    Goals Addressed             This Visit's Progress    Patient Stated       03/24/2023, wants to tone up       Depression Screen     03/24/2023    9:41 AM 08/27/2022    8:42 AM 03/05/2022   10:31 AM 12/24/2021    9:48 AM 02/20/2021    9:51 AM 02/15/2020    9:35 AM 03/13/2019    8:42 AM  PHQ 2/9 Scores  PHQ - 2 Score 0 6 0 0 0 0 0  PHQ- 9 Score 6 14         Fall Risk     03/24/2023    9:41 AM 08/27/2022    8:42 AM 03/05/2022   10:31 AM 12/24/2021    9:48 AM 02/20/2021    9:51 AM  Fall Risk   Falls in the past year? 0 0 0 0 0  Number falls in past yr: 0 0 0 0   Injury with Fall? 0 0 0 0   Risk for fall due to : Medication side effect No Fall Risks Medication side effect No Fall Risks Medication side effect  Follow up Falls prevention discussed;Falls evaluation completed Falls evaluation completed Falls prevention discussed;Education provided;Falls evaluation completed Falls evaluation completed Falls evaluation completed;Education provided;Falls prevention discussed    MEDICARE RISK AT HOME:  Medicare Risk at Home Any stairs in or around the home?: Yes If so, are there any without handrails?: Yes Home free of loose throw rugs in walkways, pet beds, electrical cords, etc?: Yes Adequate lighting in your home to reduce risk of falls?: Yes Life alert?: No Use of a cane, walker or w/c?: No Grab bars in the bathroom?: No Shower chair or  bench in shower?: No Elevated toilet seat or a handicapped toilet?: Yes  TIMED UP AND GO:  Was the test performed?  No  Cognitive Function: 6CIT completed        03/24/2023    9:44 AM 03/05/2022   10:32 AM 02/20/2021    9:52 AM 02/15/2020    9:38 AM 02/09/2019    9:09 AM  6CIT Screen  What Year? 0 points 0 points 0 points 0 points 0 points  What month? 0 points 0 points 0 points 0 points 0 points  What time? 0 points 0 points 0 points 0 points 0 points  Count back from 20 0 points 0 points 0 points 0 points 0 points  Months in reverse 0 points 0 points 0 points 0 points 2 points  Repeat phrase 0 points 0 points 0 points 0 points 0 points  Total Score 0 points 0 points 0 points 0 points 2 points    Immunizations Immunization History  Administered Date(s) Administered   Influenza, High Dose Seasonal PF 11/08/2018, 10/16/2019, 11/03/2022   Influenza,inj,Quad PF,6+ Mos 10/16/2013   Influenza-Unspecified 10/16/2019, 11/05/2020, 10/30/2021   Moderna Covid-19 Fall Seasonal Vaccine 67yrs & older 11/03/2022   PFIZER Comirnaty(Gray Top)Covid-19 Tri-Sucrose Vaccine 06/14/2020   PFIZER(Purple Top)SARS-COV-2 Vaccination 03/21/2019, 04/11/2019, 10/28/2019   Pfizer Covid-19 Vaccine Bivalent Booster 11yrs & up 11/21/2020, 10/30/2021   Pneumococcal Conjugate-13 03/02/2018   Pneumococcal Polysaccharide-23 06/01/2019   RSV,unspecified 11/07/2021   Tdap 11/07/2021   Zoster Recombinant(Shingrix) 05/09/2020, 11/05/2020    Screening Tests Health Maintenance  Topic Date Due   COVID-19 Vaccine (8 - 2024-25 season) 12/29/2022   Fecal DNA (Cologuard)  03/06/2023   Medicare Annual Wellness (AWV)  03/23/2024   DTaP/Tdap/Td (2 - Td or Tdap) 11/08/2031   Pneumonia Vaccine 94+ Years old  Completed   INFLUENZA VACCINE  Completed   Hepatitis C Screening  Completed   Zoster Vaccines- Shingrix  Completed   HPV VACCINES  Aged Out    Health Maintenance  Health Maintenance Due  Topic Date Due    COVID-19 Vaccine (8 - 2024-25 season) 12/29/2022   Fecal DNA (Cologuard)  03/06/2023   Health Maintenance Items Addressed: Cologuard Ordered  Additional Screening:  Vision Screening: Recommended annual ophthalmology exams for early detection of glaucoma and other disorders of the eye.  Dental Screening: Recommended annual dental exams for proper oral hygiene  Community Resource Referral / Chronic Care Management: CRR required this visit?  No   CCM required this visit?  No     Plan:     I have personally reviewed and noted the following in the patient's chart:   Medical and social history Use of alcohol, tobacco or illicit drugs  Current medications and supplements including opioid prescriptions. Patient is not currently taking opioid prescriptions. Functional ability and status Nutritional status Physical activity Advanced directives List of other physicians Hospitalizations, surgeries, and ER visits in previous 12 months Vitals Screenings to include cognitive, depression, and falls Referrals and appointments  In addition, I have reviewed and discussed with patient certain preventive protocols, quality metrics, and best practice recommendations. A written personalized care plan for preventive services as well as general preventive health recommendations were provided to patient.     Barb Merino, LPN   1/61/0960   After Visit Summary: (MyChart) Due to this being a telephonic visit, the after visit summary with patients personalized plan was offered to patient via MyChart   Notes: Nothing significant to report at this time.

## 2023-03-24 NOTE — Patient Instructions (Signed)
 Mr. Furio , Thank you for taking time to come for your Medicare Wellness Visit. I appreciate your ongoing commitment to your health goals. Please review the following plan we discussed and let me know if I can assist you in the future.   Referrals/Orders/Follow-Ups/Clinician Recommendations: none  This is a list of the screening recommended for you and due dates:  Health Maintenance  Topic Date Due   COVID-19 Vaccine (8 - 2024-25 season) 12/29/2022   Cologuard (Stool DNA test)  03/06/2023   Medicare Annual Wellness Visit  03/23/2024   DTaP/Tdap/Td vaccine (2 - Td or Tdap) 11/08/2031   Pneumonia Vaccine  Completed   Flu Shot  Completed   Hepatitis C Screening  Completed   Zoster (Shingles) Vaccine  Completed   HPV Vaccine  Aged Out    Advanced directives: (ACP Link)Information on Advanced Care Planning can be found at [facility] Advance Health Care Directives Advance Health Care Directives ([email or web address]   Next Medicare Annual Wellness Visit scheduled for next year: Yes  insert Preventive Care attachment Insert FALL PREVENTION attachment if needed

## 2023-03-30 DIAGNOSIS — Z1211 Encounter for screening for malignant neoplasm of colon: Secondary | ICD-10-CM | POA: Diagnosis not present

## 2023-04-04 LAB — COLOGUARD: COLOGUARD: NEGATIVE

## 2023-04-13 ENCOUNTER — Telehealth: Payer: Self-pay | Admitting: Cardiovascular Disease

## 2023-04-13 NOTE — Telephone Encounter (Signed)
 Patient identification verified by 2 forms. Marilynn Rail, RN    Called and spoke to patient  Patient states:   -Noticed he has increased SOB x1 week   -noticed breathing becomes labored during his walks   -he walks 1.5 miles daily and uses bike at the gym   -he is able to complete walks but he is SOB and fatigued more than usual   -he has a Visual merchandiser   -concerned he could have issues with battery of pacemaker   -noted he has chest tightness when breathing becomes labored  Patient denies:   -swelling   -LOC/fall   -chest pain  RN offered 3/19 OV, patient declined, states he needs OV prior to 10am  Informed patient message sent to Dr. Royann Shivers for input/advisement  Reviewed ED warning signs/precautions  Patient verbalized understanding, no questions at this time

## 2023-04-13 NOTE — Telephone Encounter (Signed)
 Pt c/o Shortness Of Breath: STAT if SOB developed within the last 24 hours or pt is noticeably SOB on the phone  1. Are you currently SOB (can you hear that pt is SOB on the phone)? no  2. How long have you been experiencing SOB? 1 week  3. Are you SOB when sitting or when up moving around? Up moving around  4. Are you currently experiencing any other symptoms? Really fatigue

## 2023-04-13 NOTE — Telephone Encounter (Signed)
 Last pacemaker check around Christmas looked OK and the estimated battery longevity was over 3 years. His pacemaker model has a very predictable battery behavior so I do not think that is likely to be a problem. He is due for another pacemaker download in just over a week so will check then. He had a normal echo last April. One think we have not checked for in over 10 years is for any progression of coronary disease.  If I do not have an open spot coming up soon, can we offer him an APP appointment on a day that I am in clinic?

## 2023-04-14 NOTE — Telephone Encounter (Signed)
 Croitoru, Mihai, MD  You14 hours ago (7:02 PM)    Actually, can he come in this Thursday at 3:20 PM?  Patient identification verified by 2 forms. Marilynn Rail, RN    Called and spoke to patient  Patient agrees with 3/20 OV at 3:20pm with Dr.Croitoru  Reviewed ED warning signs/precautions  Patient verbalized understanding, no questions at this time

## 2023-04-15 ENCOUNTER — Ambulatory Visit: Attending: Cardiovascular Disease | Admitting: Cardiovascular Disease

## 2023-04-15 ENCOUNTER — Encounter: Payer: Self-pay | Admitting: Cardiovascular Disease

## 2023-04-15 VITALS — BP 120/82 | HR 62 | Ht 71.0 in | Wt 206.4 lb

## 2023-04-15 DIAGNOSIS — I7121 Aneurysm of the ascending aorta, without rupture: Secondary | ICD-10-CM | POA: Diagnosis not present

## 2023-04-15 DIAGNOSIS — I495 Sick sinus syndrome: Secondary | ICD-10-CM

## 2023-04-15 DIAGNOSIS — Z950 Presence of cardiac pacemaker: Secondary | ICD-10-CM | POA: Diagnosis not present

## 2023-04-15 DIAGNOSIS — R0602 Shortness of breath: Secondary | ICD-10-CM

## 2023-04-15 DIAGNOSIS — R071 Chest pain on breathing: Secondary | ICD-10-CM | POA: Diagnosis not present

## 2023-04-15 DIAGNOSIS — N183 Chronic kidney disease, stage 3 unspecified: Secondary | ICD-10-CM | POA: Diagnosis not present

## 2023-04-15 DIAGNOSIS — I129 Hypertensive chronic kidney disease with stage 1 through stage 4 chronic kidney disease, or unspecified chronic kidney disease: Secondary | ICD-10-CM

## 2023-04-15 DIAGNOSIS — R079 Chest pain, unspecified: Secondary | ICD-10-CM | POA: Diagnosis not present

## 2023-04-15 MED ORDER — DIPHENHYDRAMINE HCL 50 MG PO TABS: 50.0000 mg | ORAL_TABLET | Freq: Once | ORAL | 0 refills | Status: DC

## 2023-04-15 MED ORDER — PREDNISONE 50 MG PO TABS
ORAL_TABLET | ORAL | 0 refills | Status: DC
Start: 2023-04-15 — End: 2023-05-07

## 2023-04-15 NOTE — Patient Instructions (Addendum)
 Medication Instructions:  Your physician recommends that you continue on your current medications as directed. Please refer to the Current Medication list given to you today.  *If you need a refill on your cardiac medications before your next appointment, please call your pharmacy*   Lab Work: Labs today- BMP   Testing/Procedures:   Your cardiac CT will be scheduled at one of the below locations:   [facility] [address] [place], [place] [address] [phone]  If scheduled at [facility], please arrive at the [facility] and Children's Entrance (Entrance C2) of [facility] 30 minutes prior to test start time. You can use the FREE valet parking offered at entrance C (encouraged to control the heart rate for the test)  Proceed to the [facility] Radiology Department (first floor) to check-in and test prep.  All radiology patients and guests should use entrance C2 at [facility], accessed from [facility], even though the hospital's physical address listed is [address]     Please follow these instructions carefully (unless otherwise directed):  An IV will be required for this test and Nitroglycerin will be given.  Hold all erectile dysfunction medications at least 3 days (72 hrs) prior to test. (Ie viagra, cialis, sildenafil, tadalafil, etc)   On the Night Before the Test: Be sure to Drink plenty of water. Do not consume any caffeinated/decaffeinated beverages or chocolate 12 hours prior to your test. Do not take any antihistamines 12 hours prior to your test. If the patient has contrast allergy: Patient will need a prescription for Prednisone and very clear instructions (as follows): Prednisone 50 mg - take 13 hours prior to test Take another Prednisone 50 mg 7 hours prior to test Take another Prednisone 50 mg 1 hour prior to test Take Benadryl 50 mg 1 hour prior to test Patient must complete all four doses of  above prophylactic medications. Patient will need a ride after test due to Benadryl.  On the Day of the Test: Drink plenty of water until 1 hour prior to the test. Do not eat any food 1 hour prior to test. You may take your regular medications prior to the test. PLEASE HOLD CANDESARTAN CILEXETIL- HCTZ Patients who wear a continuous glucose monitor MUST remove the device prior to scanning.      After the Test: Drink plenty of water. After receiving IV contrast, you may experience a mild flushed feeling. This is normal. On occasion, you may experience a mild rash up to 24 hours after the test. This is not dangerous. If this occurs, you can take Benadryl 25 mg, Zyrtec, Claritin, or Allegra and increase your fluid intake. (Patients taking Tikosyn should avoid Benadryl, and may take Zyrtec, Claritin, or Allegra) If you experience trouble breathing, this can be serious. If it is severe call 911 IMMEDIATELY. If it is mild, please call our office.  We will call to schedule your test 2-4 weeks out understanding that some insurance companies will need an authorization prior to the service being performed.   For more information and frequently asked questions, please visit our website : [email or web address]  For non-scheduling related questions, please contact the cardiac imaging nurse navigator should you have any questions/concerns: Cardiac Imaging Nurse Navigators Direct Office Dial: [phone]   For scheduling needs, including cancellations and rescheduling, please call [place], [phone].    Follow-Up: At [facility], you and your health needs are our priority.  As part of our continuing  mission to provide you with exceptional heart care, we have created designated Provider Care Teams.  These Care Teams include your primary Cardiologist (physician) and Advanced Practice Providers (APPs -  Physician Assistants and Nurse Practitioners) who all work together to provide  you with the care you need, when you need it.  We recommend signing up for the patient portal called "MyChart".  Sign up information is provided on this After Visit Summary.  MyChart is used to connect with patients for Virtual Visits (Telemedicine).  Patients are able to view lab/test results, encounter notes, upcoming appointments, etc.  Non-urgent messages can be sent to your provider as well.   To learn more about what you can do with MyChart, go to [email or web address].    Your next appointment:   FOLLOW UP AS SCHEDULED   Other Instructions   1st Floor: - Lobby - Registration  - Pharmacy  - Lab - Cafe  2nd Floor: - PV Lab - Diagnostic Testing (echo, CT, nuclear med)  3rd Floor: - Vacant  4th Floor: - TCTS (cardiothoracic surgery) - AFib Clinic - Structural Heart Clinic - Vascular Surgery  - Vascular Ultrasound  5th Floor: - HeartCare Cardiology (general and EP) - Clinical Pharmacy for coumadin, hypertension, lipid, weight-loss medications, and med management appointments    Valet parking services will be available as well.

## 2023-04-15 NOTE — Progress Notes (Signed)
 Patient ID: Aaron Snyder, male   DOB: [birth year], 73 y.o.   MRN: [id number]    Cardiology Office Note    Date:  04/15/2023   ID:  Aaron Snyder 03-01-1950, MRN [id number]  PCP:  Aaron Felts, FNP  Cardiologist:   Thurmon Fair, MD   No chief complaint on file.   History of Present Illness:  Aaron Snyder is a 73 y.o. male who returns for follow-up on his pacemaker, initially implanted in 2005, most recent generator change 2013 for syncope related to sinus node dysfunction with pauses.  He has had fatigue related to chronotropic incompetence, well compensated by adjustments in his rate response sensor.  His current device is a Medtronic Adapta DR, the atrial lead is a Medtronic [id number], the ventricular is a Medtronic [id number] implanted in 2005.    He called in with complaints of limited exercise tolerance.  He is accustomed to daily walks and gym exercises.  He continues to walk 1 to 2 miles a day and then go to the gym, but since October he has had to take more frequent breaks in his walking (roughly after each third of a mile) because he develops chest tightness, shortness of breath and dizziness with activity.  After resting for 5 or 6 minutes the chest tightness shortness of breath and dizziness all resolved and he is able to start working out again, but then typically stops about after another third of the mile.  The symptoms have now been going on for several months, roughly since October.  Although that may be a pattern of worsening, it has been very gradual. He does not have any complaints at rest and denies orthopnea, PND, edema, focal neurological deficits, claudication, true syncope or palpitations.  In the past she has had complaints of very brief burning sensation in the midsternal area that seem to correlate with premature atrial contractions.  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  He has normal pacemaker function.  His  current generator is a Medtronic Adapta that was implanted in 2013 but he still has the original leads from 2005 (atrial lead Medtronic [id number], ventricular lead Medtronic [id number], which are not MRI conditional.  Presenting rhythm is atrial paced, ventricular sensed which is his rhythm almost 100% of the time (97.7% atrial pacing, 0.2 ventricular pacing.  He has not had any episodes of high ventricular rate or atrial fibrillation.  All lead parameters remain excellent despite the fact that his leads are 74 years old (capture threshold, sensing, impedance all checked manually today are normal).  He is always preferred aggressive rate response sensor settings so that he can exercise vigorously, but recently his exercise level is clearly lower as reflected in his heart rate histograms, without the typical "second peak" In his heart rates in the low 100s that he previously exhibited.  2019 CT showed mild enlargement of the ascending aorta at 38 mm.  We have mostly followed this with echocardiography.  He has a history of allergic response to iodinated contrast, although this occurred many many years ago.  He did fine with premedication with prednisone and antihistamines before his cardiac catheterization in 2016.  At that time he had relatively benign angiographic findings (20% stenosis in the right coronary artery and in the left main coronary artery) after a false positive nuclear stress test that appeared to show inferior wall scar.   He was worried about an abnormality detected on the chest x-ray,  but chest CT showed that this was actually projection of a chronic benign rib abnormality.  He does have some emphysema changes in his lungs.  He quit smoking 20 years ago.  Incidental note was made of minor aneurysmal dilatation of the distal aortic arch and descending thoracic aorta with diameter of 3.8-3.9 cm.  Coronary angiography has shown no progression of disease from 2005 to 2013 to 2016. Has a history of  degenerative disc disease in his spine, previous head injury and cervical spine problems, GERD.    Past Medical History:  Diagnosis Date   Atypical chest pain 11/24/2013   Chest pain 12/14/2011   Coronary artery disease    Dysphagia    Dyspnea on exertion 04/04/2019   Head injury    Hypertension    Pacemaker generator end of life 12/18/2011   Medtronic   Pleuritic chest pain 10/15/2013   Presence of permanent cardiac pacemaker 02/01/2003   Medtronic   Syncope     Past Surgical History:  Procedure Laterality Date   CARDIAC CATHETERIZATION N/A 12/26/2014   Procedure: Left Heart Cath and Coronary Angiography;  Surgeon: Lennette Bihari, MD;  Location: [facility] INVASIVE CV LAB;  Service: Cardiovascular;  Laterality: N/A;   ESOPHAGOGASTRODUODENOSCOPY     [place]   LEFT AND RIGHT HEART CATHETERIZATION WITH CORONARY ANGIOGRAM N/A 12/17/2011   Procedure: LEFT AND RIGHT HEART CATHETERIZATION WITH CORONARY ANGIOGRAM;  Surgeon: Marykay Lex, MD;  Location: [facility] CATH LAB;  Service: Cardiovascular;  Laterality: N/A;   LOOP RECORDER IMPLANT/EXPLANT  2005   NM MYOCAR PERF WALL MOTION  01/2011   Negative   PACEMAKER GENERATOR CHANGE  12/18/2011   Medtronic   PACEMAKER INSERTION  2005   Medtronic   PERMANENT PACEMAKER GENERATOR CHANGE N/A 12/18/2011   Procedure: PERMANENT PACEMAKER GENERATOR CHANGE;  Surgeon: Marinus Maw, MD;  Location: [facility] CATH LAB;  Service: Cardiovascular;  Laterality: N/A;   TOTAL SHOULDER ARTHROPLASTY      Outpatient Medications Prior to Visit  Medication Sig Dispense Refill   amLODipine (NORVASC) 10 MG tablet Take 1 tablet (10 mg total) by mouth daily. 90 tablet 1   aspirin EC 81 MG tablet Take 81 mg by mouth daily.     atorvastatin (LIPITOR) 10 MG tablet TAKE 1 TABLET BY MOUTH ON MONDAY, WEDNESDAY, FRIDAYS 36 tablet 2   Candesartan Cilexetil-HCTZ 32-25 MG TABS Take 1 tablet by mouth daily. 90 tablet 1   cholecalciferol (VITAMIN D3) 25 MCG (1000 UNIT) tablet Take 5,000  Units by mouth daily.     loratadine (CLARITIN) 10 MG tablet Take 10 mg by mouth daily.     Zinc 50 MG CAPS Take 1 capsule by mouth daily.     Multiple Vitamins-Minerals (MULTIVITAMIN WITH MINERALS) tablet Take 1 tablet by mouth daily. (Patient not taking: Reported on 04/15/2023)     TURMERIC PO Take 1 tablet by mouth daily. (Patient not taking: Reported on 04/15/2023)     No facility-administered medications prior to visit.     Allergies:   Apple juice, Carrot [daucus carota], Iodinated contrast media, Iohexol, Oat, Soy allergy (obsolete), and Cow's milk [milk (cow)]   Social History   Socioeconomic History   Marital status: Widowed    Spouse name: Not on file   Number of children: Not on file   Years of education: Not on file   Highest education level: Not on file  Occupational History   Occupation: Disabled    Employer: UNEMPLOYED  Tobacco Use   Smoking  status: Former    Current packs/day: 0.00    Average packs/day: 1 pack/day for 15.0 years (15.0 ttl pk-yrs)    Types: Cigarettes    Start date: 03/15/1981    Quit date: 03/15/1996    Years since quitting: 27.1   Smokeless tobacco: Never  Vaping Use   Vaping status: Never Used  Substance and Sexual Activity   Alcohol use: No   Drug use: Not Currently    Types: Marijuana   Sexual activity: Not Currently  Other Topics Concern   Not on file  Social History Narrative   Not on file   Social Drivers of Health   Financial Resource Strain: Low Risk  (03/24/2023)   Overall Financial Resource Strain (CARDIA)    Difficulty of Paying Living Expenses: Not hard at all  Food Insecurity: No Food Insecurity (03/24/2023)   Hunger Vital Sign    Worried About Running Out of Food in the Last Year: Never true    Ran Out of Food in the Last Year: Never true  Transportation Needs: No Transportation Needs (03/24/2023)   PRAPARE - Administrator, Civil Service (Medical): No    Lack of Transportation (Non-Medical): No  Physical  Activity: Sufficiently Active (03/24/2023)   Exercise Vital Sign    Days of Exercise per Week: 7 days    Minutes of Exercise per Session: 90 min  Stress: No Stress Concern Present (03/24/2023)   [employer] of Occupational Health - Occupational Stress Questionnaire    Feeling of Stress : Only a little  Social Connections: Socially Isolated (03/24/2023)   Social Connection and Isolation Panel [NHANES]    Frequency of Communication with Friends and Family: More than three times a week    Frequency of Social Gatherings with Friends and Family: More than three times a week    Attends Religious Services: Never    Database administrator or Organizations: No    Attends Banker Meetings: Never    Marital Status: Widowed      ROS:   Please see the history of present illness.    ROS All other systems are reviewed and are negative.   PHYSICAL EXAM:   VS:  BP 120/82 (BP Location: Left Arm, Patient Position: Sitting, Cuff Size: Normal)   Pulse 62   Ht 5\' 11"  (1.803 m)   Wt 206 lb 6.4 oz (93.6 kg)   SpO2 92%   BMI 28.79 kg/m      General: Alert, oriented x3, no distress, appears younger than stated age, mildly overweight.  Healthy left subclavian pacemaker site. Head: no evidence of trauma, PERRL, EOMI, no exophtalmos or lid lag, no myxedema, no xanthelasma; normal ears, nose and oropharynx Neck: normal jugular venous pulsations and no hepatojugular reflux; brisk carotid pulses without delay and no carotid bruits Chest: clear to auscultation, no signs of consolidation by percussion or palpation, normal fremitus, symmetrical and full respiratory excursions Cardiovascular: normal position and quality of the apical impulse, regular rhythm, normal first and second heart sounds, no murmurs, rubs or gallops Abdomen: no tenderness or distention, no masses by palpation, no abnormal pulsatility or arterial bruits, normal bowel sounds, no hepatosplenomegaly Extremities: no clubbing,  cyanosis or edema; 2+ radial, ulnar and brachial pulses bilaterally; 2+ right femoral, posterior tibial and dorsalis pedis pulses; 2+ left femoral, posterior tibial and dorsalis pedis pulses; no subclavian or femoral bruits Neurological: grossly nonfocal Psych: Normal mood and affect   Wt Readings from Last 3 Encounters:  04/15/23  206 lb 6.4 oz (93.6 kg)  03/01/23 207 lb 6.4 oz (94.1 kg)  10/16/22 206 lb (93.4 kg)      Studies/Labs Reviewed:   EKG:    EKG Interpretation Date/Time:  Thursday April 15 2023 15:29:08 EDT Ventricular Rate:  62 PR Interval:  218 QRS Duration:  96 QT Interval:  392 QTC Calculation: 397 R Axis:   -23  Text Interpretation: Atrial-paced rhythm with prolonged AV conduction Minimal voltage criteria for LVH, may be normal variant ( R in aVL ) When compared with ECG of 16-Sep-2019 11:15, No significant change was found Confirmed by Hadlei Stitt [phone]) on 04/15/2023 4:06:38 PM         Recent Labs: 08/27/2022: ALT 20; Hemoglobin 14.5; Platelets 201 03/01/2023: BUN 23; Creatinine, Ser 1.52; Potassium 3.9; Sodium 141  Requested most recent labs from Dr. Allyne Gee office.  ASSESSMENT:    1. Exertional chest pain   2. SOB (shortness of breath)   3. Benign hypertension with CKD (chronic kidney disease) stage III (HCC)   4. SSS (sick sinus syndrome) (HCC)   5. Pacemaker   6. Aneurysm of ascending aorta without rupture (HCC)     PLAN:  In order of problems listed above:  CAD: He gives a fairly convincing clinical history for stable angina pectoris.  Previous angiography has only shown mild plaque even though the nuclear stress test in 2016 suggested inferior scar.  Chest pain has led to angiography on 3 different occasions, most recently December 2016, without any evidence of progression of coronary disease and without significant obstructive lesions.  I think the best next step is a coronary CT angiogram.  He will require premedication for iodinated contrast  allergy. SSS: He has essentially 100% atrial paced, ventricular sensed rhythm with a satisfactory heart rate histogram distribution, but his ability to exercise does seem to be limited based on the less frequent increase in sensor driven faster heart rates. HTN: Well-controlled on amlodipine, candesartan, hydrochlorothiazide, all of them in maximum or relatively high doses. PPM: Normal pacemaker function.  Continue remote downloads every 3 months. Ao aneurysm: There has been no evidence of increase in size in his ascending aorta by echocardiography but will reevaluate this by CT when he has his coronary CT angiogram. CKD stage 2-3: He was seeing Dr. Kathrene Bongo in the [place] [facility].  His most recent creatinine is actually better than previously estimated baseline at 1.2.  Rechecking labs today.    Medication Adjustments/Labs and Tests Ordered: Current medicines are reviewed at length with the patient today.  Concerns regarding medicines are outlined above.  Medication changes, Labs and Tests ordered today are listed in the Patient Instructions below. Patient Instructions  Medication Instructions:  Your physician recommends that you continue on your current medications as directed. Please refer to the Current Medication list given to you today.  *If you need a refill on your cardiac medications before your next appointment, please call your pharmacy*   Lab Work: Labs today- BMP   Testing/Procedures:   Your cardiac CT will be scheduled at one of the below locations:   [facility] [address] [place], [place] [address] [phone]  If scheduled at [facility], please arrive at the [facility] and Children's Entrance (Entrance C2) of [facility] 30 minutes prior to test start time. You can use the FREE valet parking offered at entrance C (encouraged to control the heart rate for the test)  Proceed to the [facility] Radiology Department (first  floor) to check-in and test prep.  All radiology patients and guests should use entrance C2 at [facility], accessed from [facility], even though the hospital's physical address listed is [address]     Please follow these instructions carefully (unless otherwise directed):  An IV will be required for this test and Nitroglycerin will be given.  Hold all erectile dysfunction medications at least 3 days (72 hrs) prior to test. (Ie viagra, cialis, sildenafil, tadalafil, etc)   On the Night Before the Test: Be sure to Drink plenty of water. Do not consume any caffeinated/decaffeinated beverages or chocolate 12 hours prior to your test. Do not take any antihistamines 12 hours prior to your test. If the patient has contrast allergy: Patient will need a prescription for Prednisone and very clear instructions (as follows): Prednisone 50 mg - take 13 hours prior to test Take another Prednisone 50 mg 7 hours prior to test Take another Prednisone 50 mg 1 hour prior to test Take Benadryl 50 mg 1 hour prior to test Patient must complete all four doses of above prophylactic medications. Patient will need a ride after test due to Benadryl.  On the Day of the Test: Drink plenty of water until 1 hour prior to the test. Do not eat any food 1 hour prior to test. You may take your regular medications prior to the test. PLEASE HOLD CANDESARTAN CILEXETIL- HCTZ Patients who wear a continuous glucose monitor MUST remove the device prior to scanning.      After the Test: Drink plenty of water. After receiving IV contrast, you may experience a mild flushed feeling. This is normal. On occasion, you may experience a mild rash up to 24 hours after the test. This is not dangerous. If this occurs, you can take Benadryl 25 mg, Zyrtec, Claritin, or Allegra and increase your fluid intake. (Patients taking Tikosyn should avoid Benadryl, and may take Zyrtec, Claritin, or Allegra) If  you experience trouble breathing, this can be serious. If it is severe call 911 IMMEDIATELY. If it is mild, please call our office.  We will call to schedule your test 2-4 weeks out understanding that some insurance companies will need an authorization prior to the service being performed.   For more information and frequently asked questions, please visit our website : [email or web address]  For non-scheduling related questions, please contact the cardiac imaging nurse navigator should you have any questions/concerns: Cardiac Imaging Nurse Navigators Direct Office Dial: [phone]   For scheduling needs, including cancellations and rescheduling, please call [place], [phone].    Follow-Up: At [facility], you and your health needs are our priority.  As part of our continuing mission to provide you with exceptional heart care, we have created designated Provider Care Teams.  These Care Teams include your primary Cardiologist (physician) and Advanced Practice Providers (APPs -  Physician Assistants and Nurse Practitioners) who all work together to provide you with the care you need, when you need it.  We recommend signing up for the patient portal called "MyChart".  Sign up information is provided on this After Visit Summary.  MyChart is used to connect with patients for Virtual Visits (Telemedicine).  Patients are able to view lab/test results, encounter notes, upcoming appointments, etc.  Non-urgent messages can be sent to your provider as well.   To learn more about what you can do with MyChart, go to [email or web address].    Your next appointment:   FOLLOW UP AS SCHEDULED  Other Instructions   1st Floor: - Lobby - Registration  - Pharmacy  - Lab - Cafe  2nd Floor: - PV Lab - Diagnostic Testing (echo, CT, nuclear med)  3rd Floor: - Vacant  4th Floor: - TCTS (cardiothoracic surgery) - AFib Clinic - Structural Heart Clinic - Vascular  Surgery  - Vascular Ultrasound  5th Floor: - HeartCare Cardiology (general and EP) - Clinical Pharmacy for coumadin, hypertension, lipid, weight-loss medications, and med management appointments    Valet parking services will be available as well.           Signed, Thurmon Fair, MD  04/15/2023 8:18 PM    [facility] [address] [place], [place], [place]  [address] Phone: [phone]; Fax: [phone]

## 2023-04-16 ENCOUNTER — Encounter: Payer: Self-pay | Admitting: Cardiovascular Disease

## 2023-04-16 LAB — BASIC METABOLIC PANEL
BUN/Creatinine Ratio: 15 (ref 10–24)
BUN: 22 mg/dL (ref 8–27)
CO2: 21 mmol/L (ref 20–29)
Calcium: 9.6 mg/dL (ref 8.6–10.2)
Chloride: 103 mmol/L (ref 96–106)
Creatinine, Ser: 1.45 mg/dL — ABNORMAL HIGH (ref 0.76–1.27)
Glucose: 95 mg/dL (ref 70–99)
Potassium: 3.9 mmol/L (ref 3.5–5.2)
Sodium: 142 mmol/L (ref 134–144)
eGFR: 51 mL/min/{1.73_m2} — ABNORMAL LOW (ref >59)

## 2023-04-19 ENCOUNTER — Ambulatory Visit (INDEPENDENT_AMBULATORY_CARE_PROVIDER_SITE_OTHER): Payer: Medicare HMO

## 2023-04-19 DIAGNOSIS — I495 Sick sinus syndrome: Secondary | ICD-10-CM | POA: Diagnosis not present

## 2023-04-20 ENCOUNTER — Encounter: Payer: Self-pay | Admitting: Internal Medicine

## 2023-04-20 ENCOUNTER — Encounter: Payer: Self-pay | Admitting: Cardiovascular Disease

## 2023-04-20 LAB — CUP PACEART REMOTE DEVICE CHECK
Battery Impedance: 1786 Ohm
Battery Remaining Longevity: 45 mo
Battery Voltage: 2.74 V
Brady Statistic AP VP Percent: 0 %
Brady Statistic AP VS Percent: 99 %
Brady Statistic AS VP Percent: 0 %
Brady Statistic AS VS Percent: 1 %
Date Time Interrogation Session: 20250324100733
Implantable Lead Connection Status: 753985
Implantable Lead Connection Status: 753985
Implantable Lead Implant Date: 20050106
Implantable Lead Implant Date: 20131122
Implantable Lead Location: 753859
Implantable Lead Location: 753860
Implantable Lead Model: 5092
Implantable Lead Model: 5594
Implantable Pulse Generator Implant Date: 20131122
Lead Channel Impedance Value: 430 Ohm
Lead Channel Impedance Value: 536 Ohm
Lead Channel Pacing Threshold Amplitude: 0.5 V
Lead Channel Pacing Threshold Amplitude: 0.625 V
Lead Channel Pacing Threshold Pulse Width: 0.4 ms
Lead Channel Pacing Threshold Pulse Width: 0.4 ms
Lead Channel Setting Pacing Amplitude: 1.5 V
Lead Channel Setting Pacing Amplitude: 2 V
Lead Channel Setting Pacing Pulse Width: 0.4 ms
Lead Channel Setting Sensing Sensitivity: 2.8 mV
Zone Setting Status: 755011
Zone Setting Status: 755011

## 2023-04-30 ENCOUNTER — Telehealth (HOSPITAL_COMMUNITY): Payer: Self-pay | Admitting: *Deleted

## 2023-04-30 NOTE — Telephone Encounter (Signed)
 Reaching out to patient to offer assistance regarding upcoming cardiac imaging study; pt verbalizes understanding of appt date/time, parking situation and where to check in, pre-test NPO status and medications ordered, and verified current allergies; name and call back number provided for further questions should they arise  Larey Brick RN Navigator Cardiac Imaging [facility] [phone] office [phone] cell  Patient states that he had vomiting and heart burn after his last contrasted CT study that occurred when he got home. Consulted with CT and reassumed patient that that did not sound like a anaphylactic reaction. We reviewed how to take 13 hour prep and he verbalized understanding. He is aware to arrive at 7:30 AM.

## 2023-05-03 ENCOUNTER — Ambulatory Visit (HOSPITAL_COMMUNITY)
Admission: RE | Admit: 2023-05-03 | Discharge: 2023-05-03 | Disposition: A | Discharge status: Home / Self Care | Source: Ambulatory Visit | Attending: Cardiovascular Disease | Admitting: Cardiovascular Disease

## 2023-05-03 ENCOUNTER — Ambulatory Visit (HOSPITAL_BASED_OUTPATIENT_CLINIC_OR_DEPARTMENT_OTHER)
Admission: RE | Admit: 2023-05-03 | Discharge: 2023-05-03 | Disposition: A | Discharge status: Home / Self Care | Source: Ambulatory Visit | Attending: Internal Medicine | Admitting: Internal Medicine

## 2023-05-03 ENCOUNTER — Encounter: Payer: Self-pay | Admitting: Cardiovascular Disease

## 2023-05-03 VITALS — BP 149/78 | Resp 19

## 2023-05-03 DIAGNOSIS — N183 Chronic kidney disease, stage 3 unspecified: Secondary | ICD-10-CM | POA: Insufficient documentation

## 2023-05-03 DIAGNOSIS — R931 Abnormal findings on diagnostic imaging of heart and coronary circulation: Secondary | ICD-10-CM | POA: Diagnosis not present

## 2023-05-03 DIAGNOSIS — R079 Chest pain, unspecified: Secondary | ICD-10-CM | POA: Diagnosis not present

## 2023-05-03 DIAGNOSIS — R0602 Shortness of breath: Secondary | ICD-10-CM | POA: Diagnosis present

## 2023-05-03 DIAGNOSIS — I129 Hypertensive chronic kidney disease with stage 1 through stage 4 chronic kidney disease, or unspecified chronic kidney disease: Secondary | ICD-10-CM | POA: Diagnosis present

## 2023-05-03 DIAGNOSIS — I2510 Atherosclerotic heart disease of native coronary artery without angina pectoris: Secondary | ICD-10-CM | POA: Diagnosis not present

## 2023-05-03 MED ORDER — NITROGLYCERIN 0.4 MG SL SUBL
SUBLINGUAL_TABLET | SUBLINGUAL | Status: AC
  Filled 2023-05-03: qty 2

## 2023-05-03 MED ORDER — IOHEXOL 350 MG/ML SOLN
95.0000 mL | Freq: Once | INTRAVENOUS | Status: AC | PRN
  Administered 2023-05-03: 95 mL via INTRAVENOUS

## 2023-05-03 MED ORDER — METOPROLOL TARTRATE 5 MG/5ML IV SOLN
5.0000 mg | Freq: Once | INTRAVENOUS | Status: AC
  Administered 2023-05-03: 5 mg via INTRAVENOUS

## 2023-05-03 MED ORDER — NITROGLYCERIN 0.4 MG SL SUBL
0.8000 mg | SUBLINGUAL_TABLET | Freq: Once | SUBLINGUAL | Status: AC
  Administered 2023-05-03: 0.8 mg via SUBLINGUAL

## 2023-05-03 MED ORDER — METOPROLOL TARTRATE 5 MG/5ML IV SOLN
INTRAVENOUS | Status: AC
  Filled 2023-05-03: qty 10

## 2023-05-06 ENCOUNTER — Ambulatory Visit: Attending: Cardiovascular Disease | Admitting: Cardiovascular Disease

## 2023-05-06 ENCOUNTER — Encounter: Payer: Self-pay | Admitting: Cardiovascular Disease

## 2023-05-06 VITALS — BP 106/78 | HR 75 | Ht 71.0 in | Wt 206.0 lb

## 2023-05-06 DIAGNOSIS — I495 Sick sinus syndrome: Secondary | ICD-10-CM

## 2023-05-06 DIAGNOSIS — I2510 Atherosclerotic heart disease of native coronary artery without angina pectoris: Secondary | ICD-10-CM | POA: Diagnosis not present

## 2023-05-06 DIAGNOSIS — I10 Essential (primary) hypertension: Secondary | ICD-10-CM | POA: Diagnosis not present

## 2023-05-06 DIAGNOSIS — N1831 Chronic kidney disease, stage 3a: Secondary | ICD-10-CM

## 2023-05-06 DIAGNOSIS — I77819 Aortic ectasia, unspecified site: Secondary | ICD-10-CM

## 2023-05-06 DIAGNOSIS — Z950 Presence of cardiac pacemaker: Secondary | ICD-10-CM

## 2023-05-06 MED ORDER — ATORVASTATIN CALCIUM 20 MG PO TABS: ORAL_TABLET | ORAL | 3 refills | Status: DC

## 2023-05-06 NOTE — Progress Notes (Unsigned)
 Patient ID: Aaron Snyder, male   DOB: 1950-03-30, 73 y.o.   MRN: [id number]    Cardiology Office Note    Date:  05/07/2023   ID:  Garon, Melander 1951/01/15, MRN [id number]  PCP:  [name], FNP  Cardiologist:   Thurmon Fair, MD   Chief Complaint  Patient presents with   Follow-up    History of Present Illness:  Aaron Snyder is a 73 y.o. male who returns for follow-up on his pacemaker, initially implanted in 2005, most recent generator change 2013 for syncope related to sinus node dysfunction with pauses.  He has had fatigue related to chronotropic incompetence, well compensated by adjustments in his rate response sensor.  His current device is a Medtronic Adapta DR, the atrial lead is a Medtronic [id number], the ventricular is a Medtronic [id number] implanted in 2005.    At his last appointment he had complaints concerning for possible coronary insufficiency due to reduced exercise tolerance.  He underwent a coronary CT angiogram that thankfully did not show any evidence of meaningful coronary stenoses (maximal stenoses 25-49% serial lesions in the right coronary artery, nonsignificant by FFR).  On the other hand he had an elevated coronary calcium score of 208, placing him in the 70th percentile.  In the past he has been diagnosed as having an aortic aneurysm.  This was not confirmed on the current CT.  By my measurement to the maximum ascending aortic diameter is only 33 mm (2019 CT showed mild enlargement of the very proximal part of the descending aorta at 38 mm, but we did not catch that part of the aorta on the current CT).  He continues to have occasional right upper chest tightness that is not necessarily related to physical activity.  He also complains of protracted problems with heartburn ever since the coronary CT angiogram, a problem he has had with contrast exposure in the past.  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   His worsening exercise tolerance and in general his physical complaints seem to have worsened since he has had to shoulder this responsibility.  Presenting rhythm is atrial paced, ventricular sensed which is the rhythm he has 97% of the time.  He only has 0.3% ventricular pacing.  His heart rate histogram distribution shows that he has been less physically active, not driving his ventricular rates as high.  Today we decreased the upper sensor rate to 130 bpm.  Maintained a fairly aggressive exercise sensor setting, but decreased response to activities of daily living.  Despite the fact that his leads are about 74 years old although lead parameters are in normal range.  He has not had any episodes of atrial fibrillation or high ventricular rates.   He has a history of allergic response to iodinated contrast, although this occurred many many years ago.  He did fine with premedication with prednisone and antihistamines before his cardiac catheterization in 2016.  At that time he had relatively benign angiographic findings (20% stenosis in the right coronary artery and in the left main coronary artery) after a false positive nuclear stress test that appeared to show inferior wall scar.  He quit smoking 20 years ago.  Incidental note was made of minor aneurysmal dilatation of the distal aortic arch and descending thoracic aorta with diameter of 3.8-3.9 cm.  Coronary angiography has shown no progression of disease from 2005 to 2013 to 2016. Has a history of degenerative disc disease in  his spine, previous head injury and cervical spine problems, GERD.    Past Medical History:  Diagnosis Date   Atypical chest pain 11/24/2013   Chest pain 12/14/2011   Coronary artery disease    Dysphagia    Dyspnea on exertion 04/04/2019   Head injury    Hypertension    Pacemaker generator end of life 12/18/2011   Medtronic   Pleuritic chest pain 10/15/2013   Presence of permanent cardiac pacemaker 02/01/2003    Medtronic   Syncope     Past Surgical History:  Procedure Laterality Date   CARDIAC CATHETERIZATION N/A 12/26/2014   Procedure: Left Heart Cath and Coronary Angiography;  Surgeon: Lennette Bihari, MD;  Location: [facility] INVASIVE CV LAB;  Service: Cardiovascular;  Laterality: N/A;   ESOPHAGOGASTRODUODENOSCOPY     [place]   LEFT AND RIGHT HEART CATHETERIZATION WITH CORONARY ANGIOGRAM N/A 12/17/2011   Procedure: LEFT AND RIGHT HEART CATHETERIZATION WITH CORONARY ANGIOGRAM;  Surgeon: Marykay Lex, MD;  Location: [facility] CATH LAB;  Service: Cardiovascular;  Laterality: N/A;   LOOP RECORDER IMPLANT/EXPLANT  2005   NM MYOCAR PERF WALL MOTION  01/2011   Negative   PACEMAKER GENERATOR CHANGE  12/18/2011   Medtronic   PACEMAKER INSERTION  2005   Medtronic   PERMANENT PACEMAKER GENERATOR CHANGE N/A 12/18/2011   Procedure: PERMANENT PACEMAKER GENERATOR CHANGE;  Surgeon: Marinus Maw, MD;  Location: [facility] CATH LAB;  Service: Cardiovascular;  Laterality: N/A;   TOTAL SHOULDER ARTHROPLASTY      Outpatient Medications Prior to Visit  Medication Sig Dispense Refill   amLODipine (NORVASC) 10 MG tablet Take 1 tablet (10 mg total) by mouth daily. 90 tablet 1   aspirin EC 81 MG tablet Take 81 mg by mouth daily.     Candesartan Cilexetil-HCTZ 32-25 MG TABS Take 1 tablet by mouth daily. 90 tablet 1   cholecalciferol (VITAMIN D3) 25 MCG (1000 UNIT) tablet Take 5,000 Units by mouth daily.     loratadine (CLARITIN) 10 MG tablet Take 10 mg by mouth daily.     Multiple Vitamins-Minerals (MULTIVITAMIN WITH MINERALS) tablet Take 1 tablet by mouth daily.     Zinc 50 MG CAPS Take 1 capsule by mouth daily.     atorvastatin (LIPITOR) 10 MG tablet TAKE 1 TABLET BY MOUTH ON MONDAY, WEDNESDAY, FRIDAYS 36 tablet 2   diphenhydrAMINE (BENADRYL) 50 MG tablet Take 1 tablet (50 mg total) by mouth once for 1 dose. 1 hour prior to test 1 tablet 0   predniSONE (DELTASONE) 50 MG tablet Take 1 tablet by mouth 13 hrs prior to test, another  tablet 7 hours prior, and last dose 1 hour prior. 3 tablet 0   No facility-administered medications prior to visit.     Allergies:   Apple juice, Carrot [daucus carota], Iodinated contrast media, Iohexol, Oat, Soy allergy (obsolete), and Cow's milk [milk (cow)]     PHYSICAL EXAM:   VS:  BP 106/78   Pulse 75   Ht 5\' 11"  (1.803 m)   Wt 206 lb (93.4 kg)   SpO2 95%   BMI 28.73 kg/m      General: Alert, oriented x3, no distress, appears younger than stated age.  Healthy left subclavian pacemaker site. Head: no evidence of trauma, PERRL, EOMI, no exophtalmos or lid lag, no myxedema, no xanthelasma; normal ears, nose and oropharynx Neck: normal jugular venous pulsations and no hepatojugular reflux; brisk carotid pulses without delay and no carotid bruits Chest: clear to auscultation, no signs of  consolidation by percussion or palpation, normal fremitus, symmetrical and full respiratory excursions Cardiovascular: normal position and quality of the apical impulse, regular rhythm, normal first and second heart sounds, no murmurs, rubs or gallops Abdomen: no tenderness or distention, no masses by palpation, no abnormal pulsatility or arterial bruits, normal bowel sounds, no hepatosplenomegaly Extremities: no clubbing, cyanosis or edema; 2+ radial, ulnar and brachial pulses bilaterally; 2+ right femoral, posterior tibial and dorsalis pedis pulses; 2+ left femoral, posterior tibial and dorsalis pedis pulses; no subclavian or femoral bruits Neurological: grossly nonfocal Psych: Normal mood and affect    Wt Readings from Last 3 Encounters:  05/06/23 206 lb (93.4 kg)  04/15/23 206 lb 6.4 oz (93.6 kg)  03/01/23 207 lb 6.4 oz (94.1 kg)      Studies/Labs Reviewed:   EKG: Reviewed the most recent tracing from 04/15/2023 which shows atrial paced, ventricular sensed rhythm, borderline voltage criteria for LVH, otherwise normal  EKG Interpretation Date/Time:    Ventricular Rate:    PR  Interval:    QRS Duration:    QT Interval:    QTC Calculation:   R Axis:      Text Interpretation:           Recent Labs: 08/27/2022: ALT 20; Hemoglobin 14.5; Platelets 201 04/15/2023: BUN 22; Creatinine, Ser 1.45; Potassium 3.9; Sodium 142    03/01/2023 Cholesterol 148, HDL 56, LDL 78, triglycerides 69, hemoglobin A1c 5.9%, potassium 3.9, creatinine 1.45  ASSESSMENT:    1. SSS (sick sinus syndrome) (HCC)   2. Coronary artery disease involving native coronary artery of native heart without angina pectoris   3. Essential hypertension   4. Pacemaker   5. Dilation of descending aorta (HCC)   6. Stage 3a chronic kidney disease (HCC)      PLAN:  In order of problems listed above:  CAD: Coronary CT angiography does not show any meaningful coronary stenosis, although it does show substantial plaque burden.  Chest pain has led to angiography on 3 different occasions, most recently December 2016, without any evidence of progression of coronary disease and without significant obstructive lesions.  The focus remains on risk factor modification SSS: He has essentially 100% atrial paced, ventricular sensed rhythm with a satisfactory heart rate histogram distribution.  He has been less physically active.  Adjusted his ADL sensor setting and the upper sensor rate to avoid unnecessary fast pacing.  Reevaluate to see how this has worked in another 3-4 months. HTN: Blood pressure is excellent, but control requires high doses of multiple medications. PPM: Says her settings adjusted today.  Otherwise normal device function.  Continue remote downloads every 3 months. Ao aneurysm: Ascending aorta looks fine.  Unfortunately did not catch the most proximal part of the ascending aorta on this study. CKD stage 2-3: Creatinine is stable around 1.4-1.5.  Has seen Dr. Kathrene Bongo at [facility]. HLP: In view of moderate CAD and elevated coronary calcium score, increase his atorvastatin to 20 mg 3 times  a week.  Target LDL less than 70.    Medication Adjustments/Labs and Tests Ordered: Current medicines are reviewed at length with the patient today.  Concerns regarding medicines are outlined above.  Medication changes, Labs and Tests ordered today are listed in the Patient Instructions below. Patient Instructions  Medication Instructions:  No changes *If you need a refill on your cardiac medications before your next appointment, please call your pharmacy*  Follow-Up: At [facility], you and your health needs are our priority.  As  part of our continuing mission to provide you with exceptional heart care, our providers are all part of one team.  This team includes your primary Cardiologist (physician) and Advanced Practice Providers or APPs (Physician Assistants and Nurse Practitioners) who all work together to provide you with the care you need, when you need it.  Your next appointment:   4-5 month(s)- Pacer Check  Provider:   Thurmon Fair, MD     We recommend signing up for the patient portal called "MyChart".  Sign up information is provided on this After Visit Summary.  MyChart is used to connect with patients for Virtual Visits (Telemedicine).  Patients are able to view lab/test results, encounter notes, upcoming appointments, etc.  Non-urgent messages can be sent to your provider as well.   To learn more about what you can do with MyChart, go to [email or web address].        1st Floor: - Lobby - Registration  - Pharmacy  - Lab - Cafe  2nd Floor: - PV Lab - Diagnostic Testing (echo, CT, nuclear med)  3rd Floor: - Vacant  4th Floor: - TCTS (cardiothoracic surgery) - AFib Clinic - Structural Heart Clinic - Vascular Surgery  - Vascular Ultrasound  5th Floor: - HeartCare Cardiology (general and EP) - Clinical Pharmacy for coumadin, hypertension, lipid, weight-loss medications, and med management appointments    Valet parking services will be  available as well.        Signed, Thurmon Fair, MD  05/07/2023 9:16 PM    [facility] [address] [place], [place], [place]  [address] Phone: [phone]; Fax: [phone]

## 2023-05-06 NOTE — Patient Instructions (Addendum)
 Medication Instructions:  No changes *If you need a refill on your cardiac medications before your next appointment, please call your pharmacy*  Follow-Up: At [facility], you and your health needs are our priority.  As part of our continuing mission to provide you with exceptional heart care, our providers are all part of one team.  This team includes your primary Cardiologist (physician) and Advanced Practice Providers or APPs (Physician Assistants and Nurse Practitioners) who all work together to provide you with the care you need, when you need it.  Your next appointment:   4-5 month(s)- Pacer Check  Provider:   Thurmon Fair, MD     We recommend signing up for the patient portal called "MyChart".  Sign up information is provided on this After Visit Summary.  MyChart is used to connect with patients for Virtual Visits (Telemedicine).  Patients are able to view lab/test results, encounter notes, upcoming appointments, etc.  Non-urgent messages can be sent to your provider as well.   To learn more about what you can do with MyChart, go to [email or web address].        1st Floor: - Lobby - Registration  - Pharmacy  - Lab - Cafe  2nd Floor: - PV Lab - Diagnostic Testing (echo, CT, nuclear med)  3rd Floor: - Vacant  4th Floor: - TCTS (cardiothoracic surgery) - AFib Clinic - Structural Heart Clinic - Vascular Surgery  - Vascular Ultrasound  5th Floor: - HeartCare Cardiology (general and EP) - Clinical Pharmacy for coumadin, hypertension, lipid, weight-loss medications, and med management appointments    Valet parking services will be available as well.

## 2023-05-07 ENCOUNTER — Encounter: Payer: Self-pay | Admitting: Cardiovascular Disease

## 2023-06-08 NOTE — Addendum Note (Signed)
 Addended by: Edra Govern D on: 06/08/2023 01:23 PM   Modules accepted: Orders

## 2023-06-08 NOTE — Progress Notes (Signed)
 Remote pacemaker transmission.

## 2023-06-22 DIAGNOSIS — I129 Hypertensive chronic kidney disease with stage 1 through stage 4 chronic kidney disease, or unspecified chronic kidney disease: Secondary | ICD-10-CM | POA: Diagnosis not present

## 2023-06-22 DIAGNOSIS — Z6830 Body mass index (BMI) 30.0-30.9, adult: Secondary | ICD-10-CM | POA: Diagnosis not present

## 2023-06-22 DIAGNOSIS — R5383 Other fatigue: Secondary | ICD-10-CM | POA: Diagnosis not present

## 2023-06-22 DIAGNOSIS — N183 Chronic kidney disease, stage 3 unspecified: Secondary | ICD-10-CM | POA: Diagnosis not present

## 2023-07-19 ENCOUNTER — Ambulatory Visit (INDEPENDENT_AMBULATORY_CARE_PROVIDER_SITE_OTHER): Payer: Medicare HMO

## 2023-07-19 DIAGNOSIS — I495 Sick sinus syndrome: Secondary | ICD-10-CM | POA: Diagnosis not present

## 2023-07-20 LAB — CUP PACEART REMOTE DEVICE CHECK
Battery Impedance: 1915 Ohm
Battery Remaining Longevity: 43 mo
Battery Voltage: 2.75 V
Brady Statistic AP VP Percent: 0 %
Brady Statistic AP VS Percent: 92 %
Brady Statistic AS VP Percent: 0 %
Brady Statistic AS VS Percent: 8 %
Date Time Interrogation Session: 20250623112218
Implantable Lead Connection Status: 753985
Implantable Lead Connection Status: 753985
Implantable Lead Implant Date: 20050106
Implantable Lead Implant Date: 20131122
Implantable Lead Location: 753859
Implantable Lead Location: 753860
Implantable Lead Model: 5092
Implantable Lead Model: 5594
Implantable Pulse Generator Implant Date: 20131122
Lead Channel Impedance Value: 425 Ohm
Lead Channel Impedance Value: 546 Ohm
Lead Channel Pacing Threshold Amplitude: 0.5 V
Lead Channel Pacing Threshold Amplitude: 0.625 V
Lead Channel Pacing Threshold Pulse Width: 0.4 ms
Lead Channel Pacing Threshold Pulse Width: 0.4 ms
Lead Channel Setting Pacing Amplitude: 1.5 V
Lead Channel Setting Pacing Amplitude: 2 V
Lead Channel Setting Pacing Pulse Width: 0.4 ms
Lead Channel Setting Sensing Sensitivity: 2.8 mV
Zone Setting Status: 755011
Zone Setting Status: 755011

## 2023-07-26 ENCOUNTER — Ambulatory Visit: Payer: Self-pay | Admitting: Cardiovascular Disease

## 2023-08-01 ENCOUNTER — Other Ambulatory Visit: Payer: Self-pay

## 2023-08-01 ENCOUNTER — Emergency Department (HOSPITAL_BASED_OUTPATIENT_CLINIC_OR_DEPARTMENT_OTHER)

## 2023-08-01 ENCOUNTER — Encounter (HOSPITAL_BASED_OUTPATIENT_CLINIC_OR_DEPARTMENT_OTHER): Payer: Self-pay

## 2023-08-01 ENCOUNTER — Emergency Department (HOSPITAL_BASED_OUTPATIENT_CLINIC_OR_DEPARTMENT_OTHER)
Admission: EM | Admit: 2023-08-01 | Discharge: 2023-08-01 | Disposition: A | Discharge status: Home / Self Care | Attending: Emergency Medicine | Admitting: Emergency Medicine

## 2023-08-01 DIAGNOSIS — M5030 Other cervical disc degeneration, unspecified cervical region: Secondary | ICD-10-CM | POA: Diagnosis not present

## 2023-08-01 DIAGNOSIS — R519 Headache, unspecified: Secondary | ICD-10-CM | POA: Diagnosis not present

## 2023-08-01 DIAGNOSIS — M419 Scoliosis, unspecified: Secondary | ICD-10-CM | POA: Diagnosis not present

## 2023-08-01 DIAGNOSIS — M5441 Lumbago with sciatica, right side: Secondary | ICD-10-CM | POA: Diagnosis not present

## 2023-08-01 DIAGNOSIS — M5442 Lumbago with sciatica, left side: Secondary | ICD-10-CM | POA: Diagnosis not present

## 2023-08-01 DIAGNOSIS — Z7982 Long term (current) use of aspirin: Secondary | ICD-10-CM | POA: Insufficient documentation

## 2023-08-01 DIAGNOSIS — M51369 Other intervertebral disc degeneration, lumbar region without mention of lumbar back pain or lower extremity pain: Secondary | ICD-10-CM | POA: Diagnosis not present

## 2023-08-01 DIAGNOSIS — M542 Cervicalgia: Secondary | ICD-10-CM | POA: Insufficient documentation

## 2023-08-01 DIAGNOSIS — M545 Low back pain, unspecified: Secondary | ICD-10-CM | POA: Diagnosis not present

## 2023-08-01 DIAGNOSIS — M48061 Spinal stenosis, lumbar region without neurogenic claudication: Secondary | ICD-10-CM | POA: Diagnosis not present

## 2023-08-01 MED ORDER — KETOROLAC TROMETHAMINE 30 MG/ML IJ SOLN
30.0000 mg | Freq: Once | INTRAMUSCULAR | Status: AC
  Administered 2023-08-01: 30 mg via INTRAMUSCULAR
  Filled 2023-08-01: qty 1

## 2023-08-01 MED ORDER — OXYCODONE HCL 5 MG PO TABS: 5.0000 mg | ORAL_TABLET | Freq: Three times a day (TID) | ORAL | 0 refills | Status: DC | PRN

## 2023-08-01 MED ORDER — METHOCARBAMOL 500 MG PO TABS
500.0000 mg | ORAL_TABLET | Freq: Two times a day (BID) | ORAL | 0 refills | Status: DC | PRN
Start: 2023-08-01 — End: 2023-09-01
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
## 2023-08-01 NOTE — ED Notes (Signed)
 He is returned from CT at this time per w/c. His daughter is with him also.
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
## 2023-08-06 ENCOUNTER — Other Ambulatory Visit: Payer: Self-pay | Admitting: Nurse Practitioner

## 2023-08-06 DIAGNOSIS — N183 Chronic kidney disease, stage 3 unspecified: Secondary | ICD-10-CM

## 2023-08-25 NOTE — Progress Notes (Signed)
 Remote pacemaker transmission.

## 2023-08-25 NOTE — Addendum Note (Signed)
 Addended by: TAWNI DRILLING D on: 08/25/2023 12:00 PM   Modules accepted: Orders

## 2023-08-30 ENCOUNTER — Ambulatory Visit: Attending: Chiropractic Medicine | Admitting: Physical Therapy

## 2023-08-30 ENCOUNTER — Encounter: Payer: Self-pay | Admitting: Physical Therapy

## 2023-08-30 DIAGNOSIS — R42 Dizziness and giddiness: Secondary | ICD-10-CM | POA: Insufficient documentation

## 2023-08-30 DIAGNOSIS — R2681 Unsteadiness on feet: Secondary | ICD-10-CM | POA: Diagnosis not present

## 2023-08-30 DIAGNOSIS — M6281 Muscle weakness (generalized): Secondary | ICD-10-CM | POA: Diagnosis not present

## 2023-08-30 DIAGNOSIS — R29898 Other symptoms and signs involving the musculoskeletal system: Secondary | ICD-10-CM | POA: Insufficient documentation

## 2023-08-30 DIAGNOSIS — M542 Cervicalgia: Secondary | ICD-10-CM | POA: Diagnosis not present
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
## 2023-09-01 ENCOUNTER — Ambulatory Visit (INDEPENDENT_AMBULATORY_CARE_PROVIDER_SITE_OTHER): Payer: Self-pay | Admitting: Nurse Practitioner

## 2023-09-01 ENCOUNTER — Encounter: Payer: Self-pay | Admitting: Nurse Practitioner

## 2023-09-01 VITALS — BP 110/70 | HR 63 | Temp 97.8°F | Ht 71.0 in | Wt 197.6 lb

## 2023-09-01 DIAGNOSIS — I2510 Atherosclerotic heart disease of native coronary artery without angina pectoris: Secondary | ICD-10-CM | POA: Diagnosis not present

## 2023-09-01 DIAGNOSIS — Z79899 Other long term (current) drug therapy: Secondary | ICD-10-CM | POA: Diagnosis not present

## 2023-09-01 DIAGNOSIS — I129 Hypertensive chronic kidney disease with stage 1 through stage 4 chronic kidney disease, or unspecified chronic kidney disease: Secondary | ICD-10-CM

## 2023-09-01 DIAGNOSIS — Z125 Encounter for screening for malignant neoplasm of prostate: Secondary | ICD-10-CM | POA: Diagnosis not present

## 2023-09-01 DIAGNOSIS — R634 Abnormal weight loss: Secondary | ICD-10-CM | POA: Diagnosis not present

## 2023-09-01 DIAGNOSIS — E559 Vitamin D deficiency, unspecified: Secondary | ICD-10-CM

## 2023-09-01 DIAGNOSIS — R7309 Other abnormal glucose: Secondary | ICD-10-CM | POA: Diagnosis not present

## 2023-09-01 DIAGNOSIS — H6122 Impacted cerumen, left ear: Secondary | ICD-10-CM

## 2023-09-01 DIAGNOSIS — Z87892 Personal history of anaphylaxis: Secondary | ICD-10-CM

## 2023-09-01 DIAGNOSIS — Z0000 Encounter for general adult medical examination without abnormal findings: Secondary | ICD-10-CM | POA: Diagnosis not present

## 2023-09-01 DIAGNOSIS — R82998 Other abnormal findings in urine: Secondary | ICD-10-CM | POA: Diagnosis not present

## 2023-09-01 DIAGNOSIS — N183 Chronic kidney disease, stage 3 unspecified: Secondary | ICD-10-CM

## 2023-09-01 DIAGNOSIS — Z950 Presence of cardiac pacemaker: Secondary | ICD-10-CM

## 2023-09-01 LAB — CBC WITH DIFFERENTIAL/PLATELET
Basophils Absolute: 0 x10E3/uL (ref 0.0–0.2)
Basos: 1 %
EOS (ABSOLUTE): 0.1 x10E3/uL (ref 0.0–0.4)
Eos: 1 %
Hematocrit: 44.7 % (ref 37.5–51.0)
Hemoglobin: 14.6 g/dL (ref 13.0–17.7)
Immature Grans (Abs): 0 x10E3/uL (ref 0.0–0.1)
Immature Granulocytes: 0 %
Lymphocytes Absolute: 1.2 x10E3/uL (ref 0.7–3.1)
Lymphs: 20 %
MCH: 30 pg (ref 26.6–33.0)
MCHC: 32.7 g/dL (ref 31.5–35.7)
MCV: 92 fL (ref 79–97)
Monocytes Absolute: 0.4 x10E3/uL (ref 0.1–0.9)
Monocytes: 7 %
Neutrophils Absolute: 4.2 x10E3/uL (ref 1.4–7.0)
Neutrophils: 71 %
Platelets: 187 x10E3/uL (ref 150–450)
RBC: 4.87 x10E6/uL (ref 4.14–5.80)
RDW: 14 % (ref 11.6–15.4)
WBC: 5.9 x10E3/uL (ref 3.4–10.8)

## 2023-09-01 LAB — CMP14+EGFR
ALT: 19 IU/L (ref 0–44)
AST: 30 IU/L (ref 0–40)
Albumin: 4.4 g/dL (ref 3.8–4.8)
Alkaline Phosphatase: 100 IU/L (ref 44–121)
BUN/Creatinine Ratio: 12 (ref 10–24)
BUN: 18 mg/dL (ref 8–27)
Bilirubin Total: 0.5 mg/dL (ref 0.0–1.2)
CO2: 24 mmol/L (ref 20–29)
Calcium: 9.4 mg/dL (ref 8.6–10.2)
Chloride: 103 mmol/L (ref 96–106)
Creatinine, Ser: 1.46 mg/dL — ABNORMAL HIGH (ref 0.76–1.27)
Globulin, Total: 2.3 g/dL (ref 1.5–4.5)
Glucose: 96 mg/dL (ref 70–99)
Potassium: 4.1 mmol/L (ref 3.5–5.2)
Sodium: 143 mmol/L (ref 134–144)
Total Protein: 6.7 g/dL (ref 6.0–8.5)
eGFR: 50 mL/min/1.73 — ABNORMAL LOW (ref >59)

## 2023-09-01 LAB — LIPID PANEL
Chol/HDL Ratio: 2.2 ratio (ref 0.0–5.0)
Cholesterol, Total: 128 mg/dL (ref 100–199)
HDL: 57 mg/dL (ref >39)
LDL Chol Calc (NIH): 60 mg/dL (ref 0–99)
Triglycerides: 49 mg/dL (ref 0–149)
VLDL Cholesterol Cal: 11 mg/dL (ref 5–40)

## 2023-09-01 LAB — POCT URINALYSIS DIP (CLINITEK)
Bilirubin, UA: NEGATIVE
Blood, UA: NEGATIVE
Glucose, UA: NEGATIVE mg/dL
Ketones, POC UA: NEGATIVE mg/dL
Nitrite, UA: NEGATIVE
POC PROTEIN,UA: 30 — AB
Spec Grav, UA: 1.02 (ref 1.010–1.025)
Urobilinogen, UA: 1 U/dL
pH, UA: 7 (ref 5.0–8.0)

## 2023-09-01 LAB — HEMOGLOBIN A1C
Est. average glucose Bld gHb Est-mCnc: 120 mg/dL
Hgb A1c MFr Bld: 5.8 % — ABNORMAL HIGH (ref 4.8–5.6)

## 2023-09-01 LAB — TSH: TSH: 1.52 u[IU]/mL (ref 0.450–4.500)

## 2023-09-01 MED ORDER — EPINEPHRINE 0.3 MG/0.3ML IJ SOAJ: 0.3000 mg | INTRAMUSCULAR | 0 refills | Status: AC | PRN

## 2023-09-01 MED ORDER — AMLODIPINE BESYLATE 10 MG PO TABS: 10.0000 mg | ORAL_TABLET | Freq: Every day | ORAL | 1 refills | Status: AC

## 2023-09-01 MED ORDER — CANDESARTAN CILEXETIL-HCTZ 32-25 MG PO TABS: 1.0000 | ORAL_TABLET | Freq: Every day | ORAL | 1 refills | Status: AC

## 2023-09-01 MED ORDER — ATORVASTATIN CALCIUM 20 MG PO TABS
ORAL_TABLET | ORAL | 3 refills | Status: DC
Start: 2023-09-01 — End: 2023-11-22

## 2023-09-01 NOTE — Assessment & Plan Note (Signed)
Behavior modifications discussed and diet history reviewed.   Pt will continue to exercise regularly and modify diet with low GI, plant based foods and decrease intake of processed foods.  Recommend intake of daily multivitamin, Vitamin D, and calcium.  Recommend colonoscopy for preventive screenings, as well as recommend immunizations that include influenza, TDAP, and Shingles ( all up-to-date)
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
## 2023-09-01 NOTE — Assessment & Plan Note (Addendum)
 Blood pressure is well controlled with repeat blood pressure, continue current medications.  Continue focusing on lifestyle modifications.

## 2023-09-01 NOTE — Assessment & Plan Note (Signed)
 Will check vitamin D level and supplement as needed.    Also encouraged to spend 15 minutes in the sun daily.

## 2023-09-01 NOTE — Assessment & Plan Note (Addendum)
 HgbA1c is stable, continue focusing on healthy diet and exercise, he has been more cognizant of his sugar intake.

## 2023-09-01 NOTE — Assessment & Plan Note (Signed)
 He reports not having an episode in several years. Will send Rx for epi pen

## 2023-09-01 NOTE — Patient Instructions (Signed)
 Health Maintenance  Topic Date Due   COVID-19 Vaccine (8 - Pfizer risk 2024-25 season) 09/17/2023*   Flu Shot  04/25/2024*   Medicare Annual Wellness Visit  03/23/2024   Cologuard (Stool DNA test)  03/30/2026   DTaP/Tdap/Td vaccine (2 - Td or Tdap) 11/08/2031   Pneumococcal Vaccine for age over 33  Completed   Hepatitis C Screening  Completed   Zoster (Shingles) Vaccine  Completed   Hepatitis B Vaccine  Aged Out   HPV Vaccine  Aged Out   Meningitis B Vaccine  Aged Out  *Topic was postponed. The date shown is not the original due date.

## 2023-09-01 NOTE — Assessment & Plan Note (Signed)
 Wax is removed by with lavage with elephant ear with 1/2 water and 1/2 peroxide. Instructions for home care to prevent wax buildup are given.

## 2023-09-01 NOTE — Assessment & Plan Note (Signed)
 He does not feel this is intentional however he has been more cognizant of his healthy food intake.

## 2023-09-01 NOTE — Assessment & Plan Note (Signed)
Continue f/u with Cardiology.

## 2023-09-01 NOTE — Assessment & Plan Note (Signed)
 Urine sent for culture.

## 2023-09-02 LAB — MICROALBUMIN / CREATININE URINE RATIO
Creatinine, Urine: 119.3 mg/dL
Microalb/Creat Ratio: 46 mg/g{creat} — ABNORMAL HIGH (ref 0–29)
Microalbumin, Urine: 54.8 ug/mL

## 2023-09-03 LAB — URINE CULTURE: Organism ID, Bacteria: NO GROWTH

## 2023-09-04 LAB — PSA: Prostate Specific Ag, Serum: 4.2 ng/mL — ABNORMAL HIGH (ref 0.0–4.0)

## 2023-09-04 LAB — SPECIMEN STATUS REPORT

## 2023-09-07 ENCOUNTER — Ambulatory Visit: Payer: Self-pay | Admitting: Physical Therapy

## 2023-09-07 DIAGNOSIS — M6281 Muscle weakness (generalized): Secondary | ICD-10-CM

## 2023-09-07 DIAGNOSIS — M542 Cervicalgia: Secondary | ICD-10-CM

## 2023-09-07 DIAGNOSIS — R2681 Unsteadiness on feet: Secondary | ICD-10-CM

## 2023-09-07 DIAGNOSIS — R29898 Other symptoms and signs involving the musculoskeletal system: Secondary | ICD-10-CM | POA: Diagnosis not present

## 2023-09-07 DIAGNOSIS — R42 Dizziness and giddiness: Secondary | ICD-10-CM | POA: Diagnosis not present
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
## 2023-09-08 ENCOUNTER — Encounter: Payer: Self-pay | Admitting: Physical Therapy

## 2023-09-13 ENCOUNTER — Ambulatory Visit: Payer: Self-pay | Admitting: Physical Therapy

## 2023-09-13 DIAGNOSIS — R29898 Other symptoms and signs involving the musculoskeletal system: Secondary | ICD-10-CM | POA: Diagnosis not present

## 2023-09-13 DIAGNOSIS — R42 Dizziness and giddiness: Secondary | ICD-10-CM | POA: Diagnosis not present

## 2023-09-13 DIAGNOSIS — R2681 Unsteadiness on feet: Secondary | ICD-10-CM

## 2023-09-13 DIAGNOSIS — M542 Cervicalgia: Secondary | ICD-10-CM | POA: Diagnosis not present

## 2023-09-13 DIAGNOSIS — M6281 Muscle weakness (generalized): Secondary | ICD-10-CM | POA: Diagnosis not present
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
## 2023-09-14 ENCOUNTER — Encounter: Payer: Self-pay | Admitting: Physical Therapy

## 2023-09-21 ENCOUNTER — Encounter: Payer: Self-pay | Admitting: Physical Therapy

## 2023-09-21 ENCOUNTER — Ambulatory Visit: Payer: Self-pay | Admitting: Physical Therapy

## 2023-09-21 VITALS — BP 130/73 | HR 63

## 2023-09-21 DIAGNOSIS — R2681 Unsteadiness on feet: Secondary | ICD-10-CM | POA: Diagnosis not present

## 2023-09-21 DIAGNOSIS — M542 Cervicalgia: Secondary | ICD-10-CM | POA: Diagnosis not present

## 2023-09-21 DIAGNOSIS — R29898 Other symptoms and signs involving the musculoskeletal system: Secondary | ICD-10-CM | POA: Diagnosis not present

## 2023-09-21 DIAGNOSIS — M6281 Muscle weakness (generalized): Secondary | ICD-10-CM | POA: Diagnosis not present

## 2023-09-21 DIAGNOSIS — R42 Dizziness and giddiness: Secondary | ICD-10-CM
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
## 2023-09-28 ENCOUNTER — Ambulatory Visit: Payer: Self-pay | Attending: Chiropractic Medicine | Admitting: Physical Therapy

## 2023-09-28 ENCOUNTER — Encounter: Payer: Self-pay | Admitting: Physical Therapy

## 2023-09-28 DIAGNOSIS — R2681 Unsteadiness on feet: Secondary | ICD-10-CM | POA: Diagnosis not present

## 2023-09-28 DIAGNOSIS — M542 Cervicalgia: Secondary | ICD-10-CM | POA: Insufficient documentation

## 2023-09-28 DIAGNOSIS — R42 Dizziness and giddiness: Secondary | ICD-10-CM | POA: Diagnosis not present
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
## 2023-10-05 ENCOUNTER — Ambulatory Visit: Payer: Self-pay | Admitting: Physical Therapy

## 2023-10-05 DIAGNOSIS — M542 Cervicalgia: Secondary | ICD-10-CM | POA: Diagnosis not present

## 2023-10-05 DIAGNOSIS — R42 Dizziness and giddiness: Secondary | ICD-10-CM | POA: Diagnosis not present

## 2023-10-05 DIAGNOSIS — R2681 Unsteadiness on feet: Secondary | ICD-10-CM | POA: Diagnosis not present
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
## 2023-10-06 ENCOUNTER — Encounter: Payer: Self-pay | Admitting: Physical Therapy

## 2023-10-12 ENCOUNTER — Ambulatory Visit: Payer: Self-pay | Admitting: Physical Therapy

## 2023-10-12 DIAGNOSIS — M542 Cervicalgia: Secondary | ICD-10-CM

## 2023-10-12 DIAGNOSIS — R42 Dizziness and giddiness: Secondary | ICD-10-CM

## 2023-10-12 DIAGNOSIS — R2681 Unsteadiness on feet: Secondary | ICD-10-CM | POA: Diagnosis not present

## 2023-10-12 NOTE — Patient Instructions (Signed)
 Eyes open and closed on quilt   Marching on quilt - EO and EC; add head turns as able  Progress letter exercise to 1) - standing on quilt                                            2) - marching on quilt with letter exercise   Step up exercise - do each leg 10 times - try not to hold
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
## 2023-10-13 ENCOUNTER — Encounter: Payer: Self-pay | Admitting: Physical Therapy

## 2023-10-18 ENCOUNTER — Ambulatory Visit (INDEPENDENT_AMBULATORY_CARE_PROVIDER_SITE_OTHER): Payer: Medicare HMO

## 2023-10-18 DIAGNOSIS — I495 Sick sinus syndrome: Secondary | ICD-10-CM | POA: Diagnosis not present

## 2023-10-19 ENCOUNTER — Encounter: Payer: Self-pay | Admitting: Physical Therapy

## 2023-10-19 ENCOUNTER — Ambulatory Visit: Payer: Self-pay | Admitting: Physical Therapy

## 2023-10-19 DIAGNOSIS — M542 Cervicalgia: Secondary | ICD-10-CM | POA: Diagnosis not present

## 2023-10-19 DIAGNOSIS — R42 Dizziness and giddiness: Secondary | ICD-10-CM | POA: Diagnosis not present

## 2023-10-19 DIAGNOSIS — R2681 Unsteadiness on feet: Secondary | ICD-10-CM | POA: Diagnosis not present

## 2023-10-19 LAB — CUP PACEART REMOTE DEVICE CHECK
Battery Impedance: 2125 Ohm
Battery Remaining Longevity: 38 mo
Battery Voltage: 2.75 V
Brady Statistic AP VP Percent: 0 %
Brady Statistic AP VS Percent: 92 %
Brady Statistic AS VP Percent: 0 %
Brady Statistic AS VS Percent: 8 %
Date Time Interrogation Session: 20250922114808
Implantable Lead Connection Status: 753985
Implantable Lead Connection Status: 753985
Implantable Lead Implant Date: 20050106
Implantable Lead Implant Date: 20131122
Implantable Lead Location: 753859
Implantable Lead Location: 753860
Implantable Lead Model: 5092
Implantable Lead Model: 5594
Implantable Pulse Generator Implant Date: 20131122
Lead Channel Impedance Value: 409 Ohm
Lead Channel Impedance Value: 530 Ohm
Lead Channel Pacing Threshold Amplitude: 0.625 V
Lead Channel Pacing Threshold Amplitude: 0.625 V
Lead Channel Pacing Threshold Pulse Width: 0.4 ms
Lead Channel Pacing Threshold Pulse Width: 0.4 ms
Lead Channel Setting Pacing Amplitude: 1.5 V
Lead Channel Setting Pacing Amplitude: 2 V
Lead Channel Setting Pacing Pulse Width: 0.4 ms
Lead Channel Setting Sensing Sensitivity: 2.8 mV
Zone Setting Status: 755011
Zone Setting Status: 755011
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
## 2023-10-20 NOTE — Progress Notes (Signed)
 Remote PPM Transmission

## 2023-10-23 ENCOUNTER — Ambulatory Visit: Payer: Self-pay | Admitting: Cardiovascular Disease

## 2023-10-24 ENCOUNTER — Ambulatory Visit: Payer: Self-pay | Admitting: Nurse Practitioner

## 2023-10-24 MED ORDER — DOXYCYCLINE HYCLATE 100 MG PO TABS: 100.0000 mg | ORAL_TABLET | Freq: Two times a day (BID) | ORAL | 0 refills | Status: DC

## 2023-10-26 ENCOUNTER — Encounter: Payer: Self-pay | Admitting: Physical Therapy

## 2023-10-26 ENCOUNTER — Ambulatory Visit: Payer: Self-pay | Admitting: Physical Therapy

## 2023-10-26 DIAGNOSIS — R42 Dizziness and giddiness: Secondary | ICD-10-CM | POA: Diagnosis not present

## 2023-10-26 DIAGNOSIS — M542 Cervicalgia: Secondary | ICD-10-CM | POA: Diagnosis not present

## 2023-10-26 DIAGNOSIS — R2681 Unsteadiness on feet: Secondary | ICD-10-CM
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
## 2023-10-29 ENCOUNTER — Telehealth: Payer: Self-pay

## 2023-10-29 NOTE — Telephone Encounter (Signed)
 Copied from CRM #[id number]. Topic: Clinical - Request for Lab/Test Order >> Oct 29, 2023  9:51 AM Aaron Snyder wrote: Reason for CRM: Patient called in stated he needed to get a PSA lab done, no orders in the chart, would like a callback to be scheduled once those orders are in

## 2023-10-31 NOTE — Addendum Note (Signed)
 Addended by: ROXANNA ROCK RAMAN on: 10/31/2023 03:55 PM   Modules accepted: Orders

## 2023-11-02 ENCOUNTER — Ambulatory Visit: Payer: Self-pay | Attending: Chiropractic Medicine | Admitting: Physical Therapy

## 2023-11-02 DIAGNOSIS — R42 Dizziness and giddiness: Secondary | ICD-10-CM | POA: Insufficient documentation

## 2023-11-02 DIAGNOSIS — R29898 Other symptoms and signs involving the musculoskeletal system: Secondary | ICD-10-CM | POA: Diagnosis not present

## 2023-11-02 DIAGNOSIS — R2681 Unsteadiness on feet: Secondary | ICD-10-CM | POA: Diagnosis not present
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
## 2023-11-03 ENCOUNTER — Encounter: Payer: Self-pay | Admitting: Physical Therapy

## 2023-11-09 ENCOUNTER — Ambulatory Visit: Payer: Self-pay | Admitting: Physical Therapy

## 2023-11-09 ENCOUNTER — Encounter: Payer: Self-pay | Admitting: Physical Therapy

## 2023-11-09 DIAGNOSIS — R42 Dizziness and giddiness: Secondary | ICD-10-CM

## 2023-11-09 DIAGNOSIS — R29898 Other symptoms and signs involving the musculoskeletal system: Secondary | ICD-10-CM | POA: Diagnosis not present

## 2023-11-09 DIAGNOSIS — R2681 Unsteadiness on feet: Secondary | ICD-10-CM | POA: Diagnosis not present
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
## 2023-11-11 ENCOUNTER — Other Ambulatory Visit: Payer: Self-pay | Admitting: Nurse Practitioner

## 2023-11-11 DIAGNOSIS — R972 Elevated prostate specific antigen [PSA]: Secondary | ICD-10-CM

## 2023-11-12 ENCOUNTER — Other Ambulatory Visit: Payer: Self-pay

## 2023-11-12 ENCOUNTER — Other Ambulatory Visit: Payer: Self-pay | Admitting: Nurse Practitioner

## 2023-11-12 DIAGNOSIS — R972 Elevated prostate specific antigen [PSA]: Secondary | ICD-10-CM

## 2023-11-13 LAB — PSA TOTAL (REFLEX TO FREE): Prostate Specific Ag, Serum: 3.6 ng/mL (ref 0.0–4.0)

## 2023-11-16 ENCOUNTER — Ambulatory Visit: Payer: Self-pay | Admitting: Nurse Practitioner

## 2023-11-18 ENCOUNTER — Ambulatory Visit: Payer: Self-pay | Admitting: Physical Therapy

## 2023-11-18 DIAGNOSIS — R29898 Other symptoms and signs involving the musculoskeletal system: Secondary | ICD-10-CM

## 2023-11-18 DIAGNOSIS — R2681 Unsteadiness on feet: Secondary | ICD-10-CM

## 2023-11-18 DIAGNOSIS — R42 Dizziness and giddiness: Secondary | ICD-10-CM
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
## 2023-11-20 ENCOUNTER — Other Ambulatory Visit: Payer: Self-pay | Admitting: Cardiovascular Disease

## 2023-11-20 DIAGNOSIS — I129 Hypertensive chronic kidney disease with stage 1 through stage 4 chronic kidney disease, or unspecified chronic kidney disease: Secondary | ICD-10-CM

## 2023-11-20 DIAGNOSIS — I2510 Atherosclerotic heart disease of native coronary artery without angina pectoris: Secondary | ICD-10-CM

## 2023-11-21 ENCOUNTER — Encounter: Payer: Self-pay | Admitting: Physical Therapy

## 2023-11-21 ENCOUNTER — Other Ambulatory Visit: Payer: Self-pay | Admitting: Nurse Practitioner

## 2023-11-21 DIAGNOSIS — I2510 Atherosclerotic heart disease of native coronary artery without angina pectoris: Secondary | ICD-10-CM

## 2023-11-21 DIAGNOSIS — N183 Chronic kidney disease, stage 3 unspecified: Secondary | ICD-10-CM

## 2023-11-25 ENCOUNTER — Ambulatory Visit: Payer: Self-pay | Admitting: Physical Therapy

## 2023-11-25 ENCOUNTER — Encounter: Payer: Self-pay | Admitting: Physical Therapy

## 2023-11-25 DIAGNOSIS — R42 Dizziness and giddiness: Secondary | ICD-10-CM

## 2023-11-25 DIAGNOSIS — R29898 Other symptoms and signs involving the musculoskeletal system: Secondary | ICD-10-CM

## 2023-11-25 DIAGNOSIS — R2681 Unsteadiness on feet: Secondary | ICD-10-CM | POA: Diagnosis not present
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
## 2023-11-30 ENCOUNTER — Ambulatory Visit: Payer: Self-pay | Attending: Chiropractic Medicine | Admitting: Physical Therapy

## 2023-11-30 DIAGNOSIS — R42 Dizziness and giddiness: Secondary | ICD-10-CM | POA: Insufficient documentation

## 2023-11-30 DIAGNOSIS — R2681 Unsteadiness on feet: Secondary | ICD-10-CM | POA: Diagnosis not present

## 2023-11-30 DIAGNOSIS — M542 Cervicalgia: Secondary | ICD-10-CM | POA: Diagnosis not present
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
## 2023-12-01 ENCOUNTER — Encounter: Payer: Self-pay | Admitting: Physical Therapy

## 2023-12-02 ENCOUNTER — Telehealth: Payer: Self-pay | Admitting: Neurology

## 2023-12-02 ENCOUNTER — Ambulatory Visit: Admitting: Neurology

## 2023-12-02 ENCOUNTER — Encounter: Payer: Self-pay | Admitting: Neurology

## 2023-12-02 VITALS — BP 115/74 | HR 77 | Ht 71.0 in | Wt 193.5 lb

## 2023-12-02 DIAGNOSIS — M48061 Spinal stenosis, lumbar region without neurogenic claudication: Secondary | ICD-10-CM

## 2023-12-02 DIAGNOSIS — R42 Dizziness and giddiness: Secondary | ICD-10-CM | POA: Diagnosis not present

## 2023-12-02 DIAGNOSIS — M4802 Spinal stenosis, cervical region: Secondary | ICD-10-CM

## 2023-12-02 MED ORDER — TIZANIDINE HCL 4 MG PO TABS: 4.0000 mg | ORAL_TABLET | Freq: Every day | ORAL | 0 refills | Status: AC

## 2023-12-02 NOTE — Patient Instructions (Signed)
 Continue with physical therapy exercises at home Referral to [facility] for surgical consultation or other treatment for cervical and lumbar spondylosis Continue follow-up PCP Return as needed
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
## 2023-12-02 NOTE — Telephone Encounter (Signed)
 Referral for orthopedics fax to [facility] as requested. Phone: [phone], Fax: [phone]

## 2023-12-07 ENCOUNTER — Encounter: Payer: Self-pay | Admitting: Physical Therapy

## 2023-12-07 ENCOUNTER — Ambulatory Visit: Payer: Self-pay | Admitting: Physical Therapy

## 2023-12-07 DIAGNOSIS — R2681 Unsteadiness on feet: Secondary | ICD-10-CM

## 2023-12-07 DIAGNOSIS — R42 Dizziness and giddiness: Secondary | ICD-10-CM

## 2023-12-07 DIAGNOSIS — M542 Cervicalgia: Secondary | ICD-10-CM
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
## 2023-12-08 ENCOUNTER — Encounter: Payer: Self-pay | Admitting: Physical Therapy

## 2023-12-09 ENCOUNTER — Ambulatory Visit: Payer: Self-pay | Admitting: Physical Therapy

## 2023-12-21 DIAGNOSIS — M47812 Spondylosis without myelopathy or radiculopathy, cervical region: Secondary | ICD-10-CM | POA: Diagnosis not present

## 2023-12-21 DIAGNOSIS — M542 Cervicalgia: Secondary | ICD-10-CM | POA: Diagnosis not present

## 2024-01-11 DIAGNOSIS — M7918 Myalgia, other site: Secondary | ICD-10-CM | POA: Diagnosis not present

## 2024-01-11 DIAGNOSIS — M47812 Spondylosis without myelopathy or radiculopathy, cervical region: Secondary | ICD-10-CM | POA: Diagnosis not present

## 2024-01-11 DIAGNOSIS — M47816 Spondylosis without myelopathy or radiculopathy, lumbar region: Secondary | ICD-10-CM | POA: Diagnosis not present

## 2024-01-17 ENCOUNTER — Ambulatory Visit: Payer: Medicare HMO

## 2024-01-17 DIAGNOSIS — I495 Sick sinus syndrome: Secondary | ICD-10-CM | POA: Diagnosis not present

## 2024-01-18 LAB — CUP PACEART REMOTE DEVICE CHECK
Battery Impedance: 2309 Ohm
Battery Remaining Longevity: 35 mo
Battery Voltage: 2.75 V
Brady Statistic AP VP Percent: 0 %
Brady Statistic AP VS Percent: 93 %
Brady Statistic AS VP Percent: 0 %
Brady Statistic AS VS Percent: 6 %
Date Time Interrogation Session: 20251222094537
Implantable Lead Connection Status: 753985
Implantable Lead Connection Status: 753985
Implantable Lead Implant Date: 20050106
Implantable Lead Implant Date: 20131122
Implantable Lead Location: 753859
Implantable Lead Location: 753860
Implantable Lead Model: 5092
Implantable Lead Model: 5594
Implantable Pulse Generator Implant Date: 20131122
Lead Channel Impedance Value: 420 Ohm
Lead Channel Impedance Value: 542 Ohm
Lead Channel Pacing Threshold Amplitude: 0.5 V
Lead Channel Pacing Threshold Amplitude: 0.625 V
Lead Channel Pacing Threshold Pulse Width: 0.4 ms
Lead Channel Pacing Threshold Pulse Width: 0.4 ms
Lead Channel Setting Pacing Amplitude: 1.5 V
Lead Channel Setting Pacing Amplitude: 2 V
Lead Channel Setting Pacing Pulse Width: 0.4 ms
Lead Channel Setting Sensing Sensitivity: 2.8 mV
Zone Setting Status: 755011
Zone Setting Status: 755011

## 2024-01-19 NOTE — Progress Notes (Signed)
 Remote PPM Transmission

## 2024-01-22 ENCOUNTER — Ambulatory Visit: Payer: Self-pay | Admitting: Cardiovascular Disease

## 2024-01-24 ENCOUNTER — Ambulatory Visit: Attending: Chiropractic Medicine | Admitting: Physical Therapy

## 2024-01-24 ENCOUNTER — Encounter: Payer: Self-pay | Admitting: Physical Therapy

## 2024-01-24 DIAGNOSIS — M542 Cervicalgia: Secondary | ICD-10-CM | POA: Insufficient documentation

## 2024-01-24 DIAGNOSIS — M5459 Other low back pain: Secondary | ICD-10-CM | POA: Insufficient documentation

## 2024-01-24 DIAGNOSIS — R29898 Other symptoms and signs involving the musculoskeletal system: Secondary | ICD-10-CM | POA: Insufficient documentation

## 2024-01-24 DIAGNOSIS — R2681 Unsteadiness on feet: Secondary | ICD-10-CM | POA: Diagnosis present

## 2024-01-24 DIAGNOSIS — R293 Abnormal posture: Secondary | ICD-10-CM | POA: Insufficient documentation
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
## 2024-02-01 ENCOUNTER — Ambulatory Visit: Attending: Chiropractic Medicine | Admitting: Physical Therapy

## 2024-02-01 DIAGNOSIS — R42 Dizziness and giddiness: Secondary | ICD-10-CM | POA: Insufficient documentation

## 2024-02-01 DIAGNOSIS — R2681 Unsteadiness on feet: Secondary | ICD-10-CM | POA: Insufficient documentation

## 2024-02-01 DIAGNOSIS — M542 Cervicalgia: Secondary | ICD-10-CM | POA: Diagnosis present

## 2024-02-01 DIAGNOSIS — M5459 Other low back pain: Secondary | ICD-10-CM | POA: Diagnosis present
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
## 2024-02-02 ENCOUNTER — Encounter: Payer: Self-pay | Admitting: Physical Therapy

## 2024-02-10 ENCOUNTER — Encounter: Payer: Self-pay | Admitting: Physical Therapy

## 2024-02-10 ENCOUNTER — Ambulatory Visit: Admitting: Physical Therapy

## 2024-02-10 DIAGNOSIS — M5459 Other low back pain: Secondary | ICD-10-CM | POA: Diagnosis not present

## 2024-02-10 DIAGNOSIS — R42 Dizziness and giddiness: Secondary | ICD-10-CM

## 2024-02-10 DIAGNOSIS — R2681 Unsteadiness on feet: Secondary | ICD-10-CM

## 2024-02-10 DIAGNOSIS — M542 Cervicalgia: Secondary | ICD-10-CM

## 2024-02-10 NOTE — Therapy (Signed)
 " OUTPATIENT PHYSICAL THERAPY CERVICAL TREATMENT NOTE   Patient Name: Aaron Snyder MRN: [id number] DOB:09-22-1950, 74 y.o., male Today's Date: 02/10/2024  END OF SESSION:  PT End of Session - 02/10/24 0942     Visit Number 3    Number of Visits 9    Date for Recertification  03/24/24    Authorization Type [employer] Medicare    Authorization Time Period 01-24-24 - 04-23-24    PT Start Time 0800    PT Stop Time 0846    PT Time Calculation (min) 46 min    Activity Tolerance Patient tolerated treatment well    Behavior During Therapy [facility] for tasks assessed/performed            Past Medical History:  Diagnosis Date   Allergy    Anxiety    Arthritis    Atypical chest pain 11/24/2013   Chest pain 12/14/2011   Chronic kidney disease    Coronary artery disease    Depression    Dysphagia    Dyspnea on exertion 04/04/2019   GERD (gastroesophageal reflux disease)    Head injury    Hypertension    Pacemaker generator end of life 12/18/2011   Medtronic   Pleuritic chest pain 10/15/2013   Presence of permanent cardiac pacemaker 02/01/2003   Medtronic   Syncope    Past Surgical History:  Procedure Laterality Date   APPENDECTOMY  1962   CARDIAC CATHETERIZATION N/A 12/26/2014   Procedure: Left Heart Cath and Coronary Angiography;  Surgeon: Debby DELENA Sor, MD;  Location: [facility] INVAS[facility] LAB;  Service: Cardiovascular;  Laterality: N/A;   ESOPHAGOGASTRODUODENOSCOPY     [place]   EYE SURGERY  2018   JOINT REPLACEMENT  201q   LEFT AND RIGHT HEART CATHETERIZATION WITH CORONARY ANGIOGRAM N/A 12/17/2011   Procedure: LEFT AND RIGHT HEART CATHETERIZATION WITH CORONARY ANGIOGRAM;  Surgeon: Alm LELON Clay, MD;  Location: [facility] CATH LAB;  Service: Cardiovascular;  Laterality: N/A;   LOOP RECORDER IMPLANT/EXPLANT  01/27/2003   NM MYOCAR PERF WALL MOTION  01/27/2011   Negative   PACEMAKER GENERATOR CHANGE  12/18/2011   Medtronic   PACEMAKER INSERTION  01/27/2003   Medtronic   PERMANENT  PACEMAKER GENERATOR CHANGE N/A 12/18/2011   Procedure: PERMANENT PACEMAKER GENERATOR CHANGE;  Surgeon: Danelle LELON Birmingham, MD;  Location: [facility] CATH LAB;  Service: Cardiovascular;  Laterality: N/A;   SMALL INTESTINE SURGERY  1986   TOTAL SHOULDER ARTHROPLASTY     Patient Active Problem List   Diagnosis Date Noted   Weight loss 09/01/2023   Leukocytes in urine 09/01/2023   History of anaphylaxis 09/01/2023   SSS (sick sinus syndrome) (HCC) 03/01/2023   COVID-19 vaccination declined 03/01/2023   Encounter for annual health examination 08/27/2022   Benign hypertension with CKD (chronic kidney disease) stage III (HCC) 08/27/2022   Overweight with body mass index (BMI) of 28 to 28.9 in adult 08/27/2022   Impacted cerumen of left ear 08/27/2022   Urinary stream slowing 08/27/2022   Vitamin D  deficiency 03/21/2022   Abnormal glucose 08/09/2019   Chronic fatigue 02/03/2018   Thoracic aortic aneurysm 08/19/2017   Chronotropic incompetence with sinus node dysfunction 02/22/2015   Abnormal nuclear stress test    Abnormal computed tomography of duodenum 10/14/2013   Essential hypertension 12/07/2012   S/P cardiac pacemaker procedure, 12/18/11, new generator 12/18/2011   Syncope X 3 per the pt. 12/18/2011   Unstable angina, admitted with SSCP and arm pain 12/14/11, neg MI, no obstructive CAD by  cath 12/17/11 12/16/2011   Abnormal echocardiogram, EF 40-45% 12/15/11 (new c/w Sep 2013) 12/16/2011   Contrast media allergy 12/16/2011   Positive D dimer, VQ low risk 12/15/2011   Pacemaker - dual chamber Medtronic 2004, new generator 2013 12/14/2011   Coronary artery disease nonobstructive cath 2005 and 2016, false positive Myoview Nov 2016.     PCP: Georgina Speaks, FNP  REFERRING PROVIDER: Laqueta Ozell BIRCH, MD  REFERRING DIAG:  [phone] (ICD-10-CM) - Myalgia, other site    THERAPY DIAG:  Cervicalgia  Unsteadiness on feet  Dizziness and giddiness  Rationale for Evaluation and Treatment:  Rehabilitation  ONSET DATE: Referral date 01-11-24  SUBJECTIVE:                                                                                                                                                                                                         SUBJECTIVE STATEMENT: Pt reports he received an injection in his neck last week -says his neck feels better today and he is now able to turn his head further to each side; thought he was going to receive an injection in his low back also but MD told him he had misunderstood, that he could only receive 1 injection at a time; will make another appt to have back injection.  Pt reports he tried standing up without using his hands at home last week and realized he was able to do it - has been practicing this Hand dominance: Right  PERTINENT HISTORY:  chronic cervical and lumbar back pain; pacemaker implant 2005 and 2013 for generator change:  h/o multiple TBI's/concussions per pt report - 1998 TBI due to syncopal episode, 2004 syncopal episode resulting in concussion, concussion Sept. 2024; h/o syncope   PAIN:   02-10-24 Are you having pain? Yes: NPRS scale: neck 3/10;  back 8/10 (is burning) Pain location: neck, low back and glutes Pain description: burning in back and glutes Aggravating factors: no specific  Relieving factors: ice occasionally; uses Chirp wheel   PRECAUTIONS: None  RED FLAGS: None     WEIGHT BEARING RESTRICTIONS: No  FALLS:  Has patient fallen in last 6 months? No  LIVING ENVIRONMENT: Lives with: lives with their family Lives in: House/apartment Stairs: Yes: Internal: 7-8 steps; on right going up - lives in [place] home Has following equipment at home: None  OCCUPATION: Retired  PLOF: Independent  PATIENT GOALS: be more mobile and more steady on my feet; get up off floor easier  NEXT MD VISIT: has injections scheduled on 02-02-24 (for neck and back)  OBJECTIVE:  Note: Objective measures were  completed at Evaluation unless otherwise noted.  DIAGNOSTIC FINDINGS:  IMPRESSION: 1. No acute traumatic injury identified in the cervical spine. 2. Chronic cervical spine degeneration with possible mild degenerative spinal stenosis at C5-C6 and C6-C7.     COGNITION: Overall cognitive status: Within functional limits for tasks assessed  SENSATION: WFL  POSTURE: rounded shoulders, forward head, and increased thoracic kyphosis  PALPATION: Tenderness with palpation in bil. Upper traps   CERVICAL ROM:   Active ROM A/PROM (deg) eval  Flexion WNL  Extension   Right lateral flexion   Left lateral flexion   Right rotation 55  Left rotation 34   (Blank rows = not tested)  UPPER EXTREMITY ROM: Rt shoulder flexion to approx. 90 degrees  UPPER EXTREMITY MMT: WFL's bil. UE's   CERVICAL SPECIAL TESTS:  NT  FUNCTIONAL TESTS:  5 times sit to stand: 14.34 secs without UE support from chair  TREATMENT DATE: 02-01-24  TherEx:  Cervical rotation stretch with towel - 10 sec hold x 5 reps each side- performed in seated position                 Cervical retraction against wall - in standing - 10 sec hold 1st rep then onset of dizziness occurred; 25 sec hold 2nd rep; 3rd rep 25 sec hold - pt reports dizziness occurs   but then subsides   TherAct:  On airex - vertical head turns 5 reps EO:  EC 5 reps; marching with EO vertical head turns 5 reps;  marching with EC with vertical head turns 6 reps - severe light-headedness; 2nd set- marching with EC 10 reps vertical head turns - moderate light-headedness; 3rd rep 10 reps - no touching for balance recovery- moderate light -headedness; learned to  go slower and not lift as high; mild light  Rockerboard - anterior/posterior- performed inside // bars with UE support prn - 6 reps EO holding head up                  15 reps performed rocking with EO:  pt held head down (in flexion) to allow increased reps:  with EC - approx. 5 reps prior to onset  of severe light-headedness                           2nd set - 5 reps with EC with 2 finger support on each bar        8 reps EC with 2 finger support on 3rd rep  Holding board steady 10 reps horizontal head turns;  vertical head turns 8 reps no UE support - moderate light headedness provoked  Tall kneeling position in chair using back of chair for UE support - 40 sec hold with head held in cervical extension; dizziness occurred after 40 secs, with pt requiring seated rest period  Hip hinges in tall kneeling -  8 reps 1st rep      10 reps - UE support after 6th rep      10 reps - no UE support used  PATIENT EDUCATION:  Education details: POC and eval findings Person educated: Patient Education method: Explanation Education comprehension: verbalized understanding  HOME EXERCISE PROGRAM: To be issued  ASSESSMENT:  CLINICAL IMPRESSION: PT session focused on standing balance exercises with increased cervical extension.  Pt noted to have increased active cervical rotation to Rt and to Lt side today, after having received injection in cervical region last week.  Pt reports dizziness is provoked with performing active cervical retraction in standing but states it subsides after a few seconds and he is able to continue with this exercise.  Vertical head turns continue to provoke more light-headedness/dizziness than horizontal head turns.  Pt reported feeling stretch through trunk and low back with tall kneeling position; pt able to hold head in cervical extension for 40 secs in tall kneeling prior to onset of moderate to severe light-headedness, requiring seated rest break.  Pt may benefit from use of soft cervical collar to use only during his walking program to assist in more upright posture during this extended length of time.  Cont with POC.    OBJECTIVE IMPAIRMENTS: decreased ROM, decreased  strength, postural dysfunction, and pain.   ACTIVITY LIMITATIONS: lifting, bending, standing, squatting, and locomotion level  PARTICIPATION LIMITATIONS: cleaning, laundry, yard work, and walking program  PERSONAL FACTORS: Time since onset of injury/illness/exacerbation are also affecting patient's functional outcome.   REHAB POTENTIAL: Good  CLINICAL DECISION MAKING: Evolving/moderate complexity  EVALUATION COMPLEXITY: Moderate   GOALS: Goals reviewed with patient? Yes  SHORT TERM GOALS: Target date: 02-25-24  Independent in HEP for cervical and low back stretches and strengthening. Baseline:  Goal status: INITIAL  2.  Pt will transfer floor to stand without UE support with CGA. Baseline:  Goal status: INITIAL  3.  Improve 5x sit to stand score to </= 12 secs without UE support from chair. Baseline: 14.34 secs Goal status: INITIAL  4.  Pt will report at least 25% improvement in balance/steadiness. Baseline:  Goal status: INITIAL   LONG TERM GOALS: Target date: 03-24-24  Pt will transfer floor to stand without UE support modified independently.  Baseline:  Goal status: INITIAL  2.  Improve 5x sit to stand score to </= 10 secs without UE support from chair. Baseline: 14.34 secs Goal status: INITIAL  3.  Pt will report at least 50% improvement in balance/steadiness. Baseline:  Goal status: INITIAL  4.  Independent in updated HEP for stretching and strengthening. Baseline:  Goal status: INITIAL  5.  Pt will report neck pain </= 4/10 and back pain </= 6/10 intensity. Baseline:  Goal status: INITIAL    PLAN:  PT FREQUENCY: 1x/week  PT DURATION: 8 weeks + eval  PLANNED INTERVENTIONS: 97110-Therapeutic exercises, 97530- Therapeutic activity, 97112- Neuromuscular re-education, 97535- Self Care, [id number]- Manual therapy, Patient/Family education, and Moist heat  PLAN FOR NEXT SESSION: review/issue HEP for cervical stretches and low back    Roxanna [place], PT 02/10/2024, 9:44 AM      "

## 2024-02-15 ENCOUNTER — Encounter: Payer: Self-pay | Admitting: Physical Therapy

## 2024-02-15 ENCOUNTER — Ambulatory Visit: Admitting: Physical Therapy

## 2024-02-15 DIAGNOSIS — M542 Cervicalgia: Secondary | ICD-10-CM

## 2024-02-15 DIAGNOSIS — M5459 Other low back pain: Secondary | ICD-10-CM | POA: Diagnosis not present

## 2024-02-15 DIAGNOSIS — R42 Dizziness and giddiness: Secondary | ICD-10-CM

## 2024-02-15 DIAGNOSIS — R2681 Unsteadiness on feet: Secondary | ICD-10-CM

## 2024-02-15 NOTE — Therapy (Signed)
 " OUTPATIENT PHYSICAL THERAPY CERVICAL TREATMENT NOTE   Patient Name: Aaron Snyder MRN: [id number] DOB:02/05/1950, 74 y.o., male Today's Date: 02/15/2024  END OF SESSION:  PT End of Session - 02/15/24 1923     Visit Number 4    Number of Visits 9    Date for Recertification  03/24/24    Authorization Type [employer]na Medicare    Authorization Time Period 01-24-24 - 04-23-24    PT Start Time 0802    PT Stop Time 0848    PT Time Calculation (min) 46 min    Activity Tolerance Patient tolerated treatment well    Behavior During Therapy [facility] for tasks assessed/performed             Past Medical History:  Diagnosis Date   Allergy    Anxiety    Arthritis    Atypical chest pain 11/24/2013   Chest pain 12/14/2011   Chronic kidney disease    Coronary artery disease    Depression    Dysphagia    Dyspnea on exertion 04/04/2019   GERD (gastroesophageal reflux disease)    Head injury    Hypertension    Pacemaker generator end of life 12/18/2011   Medtronic   Pleuritic chest pain 10/15/2013   Presence of permanent cardiac pacemaker 02/01/2003   Medtronic   Syncope    Past Surgical History:  Procedure Laterality Date   APPENDECTOMY  1962   CARDIAC CATHETERIZATION N/A 12/26/2014   Procedure: Left Heart Cath and Coronary Angiography;  Surgeon: Debby DELENA Sor, MD;  Location: [facility] INVASIVE CV LAB;  Service: Cardiovascular;  Laterality: N/A;   ESOPHAGOGASTRODUODENOSCOPY     [place]   EYE SURGERY  2018   JOINT REPLACEMENT  201q   LEFT AND RIGHT HEART CATHETERIZATION WITH CORONARY ANGIOGRAM N/A 12/17/2011   Procedure: LEFT AND RIGHT HEART CATHETERIZATION WITH CORONARY ANGIOGRAM;  Surgeon: Alm LELON Clay, MD;  Location: [facility] CATH LAB;  Service: Cardiovascular;  Laterality: N/A;   LOOP RECORDER IMPLANT/EXPLANT  01/27/2003   NM MYOCAR PERF WALL MOTION  01/27/2011   Negative   PACEMAKER GENERATOR CHANGE  12/18/2011   Medtronic   PACEMAKER INSERTION  01/27/2003   Medtronic   PERMANENT  PACEMAKER GENERATOR CHANGE N/A 12/18/2011   Procedure: PERMANENT PACEMAKER GENERATOR CHANGE;  Surgeon: Danelle LELON Birmingham, MD;  Location: [facility] CATH LAB;  Service: Cardiovascular;  Laterality: N/A;   SMALL INTESTINE SURGERY  1986   TOTAL SHOULDER ARTHROPLASTY     Patient Active Problem List   Diagnosis Date Noted   Weight loss 09/01/2023   Leukocytes in urine 09/01/2023   History of anaphylaxis 09/01/2023   SSS (sick sinus syndrome) (HCC) 03/01/2023   COVID-19 vaccination declined 03/01/2023   Encounter for annual health examination 08/27/2022   Benign hypertension with CKD (chronic kidney disease) stage III (HCC) 08/27/2022   Overweight with body mass index (BMI) of 28 to 28.9 in adult 08/27/2022   Impacted cerumen of left ear 08/27/2022   Urinary stream slowing 08/27/2022   Vitamin D  deficiency 03/21/2022   Abnormal glucose 08/09/2019   Chronic fatigue 02/03/2018   Thoracic aortic aneurysm 08/19/2017   Chronotropic incompetence with sinus node dysfunction 02/22/2015   Abnormal nuclear stress test    Abnormal computed tomography of duodenum 10/14/2013   Essential hypertension 12/07/2012   S/P cardiac pacemaker procedure, 12/18/11, new generator 12/18/2011   Syncope X 3 per the pt. 12/18/2011   Unstable angina, admitted with SSCP and arm pain 12/14/11, neg MI, no obstructive CAD  by cath 12/17/11 12/16/2011   Abnormal echocardiogram, EF 40-45% 12/15/11 (new c/w Sep 2013) 12/16/2011   Contrast media allergy 12/16/2011   Positive D dimer, VQ low risk 12/15/2011   Pacemaker - dual chamber Medtronic 2004, new generator 2013 12/14/2011   Coronary artery disease nonobstructive cath 2005 and 2016, false positive Myoview Nov 2016.     PCP: Georgina Speaks, FNP  REFERRING PROVIDER: Laqueta Ozell BIRCH, MD  REFERRING DIAG:  M79.18 (ICD-10-CM) - Myalgia, other site    THERAPY DIAG:  Cervicalgia  Unsteadiness on feet  Dizziness and giddiness  Rationale for Evaluation and Treatment:  Rehabilitation  ONSET DATE: Referral date 01-11-24  SUBJECTIVE:                                                                                                                                                                                                         SUBJECTIVE STATEMENT: Pt reports the ROM in his neck is still much improved since having received injections a couple of weeks ago.  Pt says he sees his chiropractor (Dr. Madison) tomorrow and will ask him about wearing a soft cervical collar when he walks in order to be able to hold his head up easier Hand dominance: Right  PERTINENT HISTORY:  chronic cervical and lumbar back pain; pacemaker implant 2005 and 2013 for generator change:  h/o multiple TBI's/concussions per pt report - 1998 TBI due to syncopal episode, 2004 syncopal episode resulting in concussion, concussion Sept. 2024; h/o syncope   PAIN:   02-15-24 Are you having pain? Yes: NPRS scale: neck 3/10;  back 8/10 (is burning) Pain location: neck, low back and glutes Pain description: burning in back and glutes Aggravating factors: no specific  Relieving factors: ice occasionally; uses Chirp wheel  Less pain experienced in seated position - pain increases significantly in standing position  PRECAUTIONS: None  RED FLAGS: None     WEIGHT BEARING RESTRICTIONS: No  FALLS:  Has patient fallen in last 6 months? No  LIVING ENVIRONMENT: Lives with: lives with their family Lives in: House/apartment Stairs: Yes: Internal: 7-8 steps; on right going up - lives in [place] home Has following equipment at home: None  OCCUPATION: Retired  PLOF: Independent  PATIENT GOALS: be more mobile and more steady on my feet; get up off floor easier  NEXT MD VISIT: has injections scheduled on 02-02-24 (for neck and back)  OBJECTIVE:  Note: Objective measures were completed at Evaluation unless otherwise noted.  DIAGNOSTIC FINDINGS:  IMPRESSION: 1. No acute traumatic injury  identified in the cervical spine. 2.  Chronic cervical spine degeneration with possible mild degenerative spinal stenosis at C5-C6 and C6-C7.     COGNITION: Overall cognitive status: Within functional limits for tasks assessed  SENSATION: WFL  POSTURE: rounded shoulders, forward head, and increased thoracic kyphosis  PALPATION: Tenderness with palpation in bil. Upper traps   CERVICAL ROM:   Active ROM A/PROM (deg) eval  Flexion WNL  Extension   Right lateral flexion   Left lateral flexion   Right rotation 55  Left rotation 34   (Blank rows = not tested)  UPPER EXTREMITY ROM: Rt shoulder flexion to approx. 90 degrees  UPPER EXTREMITY MMT: WFL's bil. UE's   CERVICAL SPECIAL TESTS:  NT  FUNCTIONAL TESTS:  5 times sit to stand: 14.34 secs without UE support from chair   TREATMENT DATE: 02-15-24  TherAct: -  Pt performed tall kneeling in bariatric chair with use of chair back for UE support prn -- performed hip hinges 10 reps Performed cervical extension in tall kneeling -approx. 5 reps Hip extension in tall kneeling - 5 reps x 2 sets LLE, 10 reps x 1 set RLE in tall kneeling ; hip abdct in tall kneeling 10 reps RLE and LLE  Performed rockerboard activity inside // bars - EO approx. 10 reps with UE support prn Rockerboard - EC 16 reps with bil UE supprot; 19 reps EC 2nd set with min UE support   Holding board steady 10 reps horizontal head turns;  vertical head turns 10 reps with min. UE support - moderate light headedness provoked  Seated cervical rotation - no dizziness provoked Standing cervical rotation with extension = no UE support;     Quadruped position on mat - cervical extension 10 reps Performed hip extension in quadruped position  -  5 reps each LE                                                                                                 PATIENT EDUCATION:  Education details: POC and eval findings Person educated: Patient Education method:  Explanation Education comprehension: verbalized understanding  HOME EXERCISE PROGRAM: To be issued  ASSESSMENT:  CLINICAL IMPRESSION: PT session focused on cervical ROM, balance/vestibular activities and core strengthening exercises in tall kneeling and quadruped positions.  Pt continues to have moderate to severe light-headedness/dizziness provoked with vertical head turns (cervical extension), with need for seated rest period to allow dizziness to subside/decrease.  Pt unable to attempt bird dog exercise in quadruped position due to inability to weight bear on single UE in this position.  Pt continues to have difficulty holding head up in neutral position in standing position for > approx. 10 secs due to onset of light-headedness/dizziness.  Cont with POC.    OBJECTIVE IMPAIRMENTS: decreased ROM, decreased strength, postural dysfunction, and pain.   ACTIVITY LIMITATIONS: lifting, bending, standing, squatting, and locomotion level  PARTICIPATION LIMITATIONS: cleaning, laundry, yard work, and walking program  PERSONAL FACTORS: Time since onset of injury/illness/exacerbation are also affecting patient's functional outcome.   REHAB POTENTIAL: Good  CLINICAL DECISION MAKING: Evolving/moderate complexity  EVALUATION COMPLEXITY: Moderate   GOALS: Goals reviewed  with patient? Yes  SHORT TERM GOALS: Target date: 02-25-24  Independent in HEP for cervical and low back stretches and strengthening. Baseline:  Goal status: INITIAL  2.  Pt will transfer floor to stand without UE support with CGA. Baseline:  Goal status: INITIAL  3.  Improve 5x sit to stand score to </= 12 secs without UE support from chair. Baseline: 14.34 secs Goal status: INITIAL  4.  Pt will report at least 25% improvement in balance/steadiness. Baseline:  Goal status: INITIAL   LONG TERM GOALS: Target date: 03-24-24  Pt will transfer floor to stand without UE support modified independently.  Baseline:  Goal  status: INITIAL  2.  Improve 5x sit to stand score to </= 10 secs without UE support from chair. Baseline: 14.34 secs Goal status: INITIAL  3.  Pt will report at least 50% improvement in balance/steadiness. Baseline:  Goal status: INITIAL  4.  Independent in updated HEP for stretching and strengthening. Baseline:  Goal status: INITIAL  5.  Pt will report neck pain </= 4/10 and back pain </= 6/10 intensity. Baseline:  Goal status: INITIAL    PLAN:  PT FREQUENCY: 1x/week  PT DURATION: 8 weeks + eval  PLANNED INTERVENTIONS: 97110-Therapeutic exercises, 97530- Therapeutic activity, [id number]- Neuromuscular re-education, 97535- Self Care, [id number]- Manual therapy, Patient/Family education, and Moist heat  PLAN FOR NEXT SESSION: check STG's  review/issue HEP for cervical stretches and low back    Tiernan Suto, [place], PT 02/15/2024, 7:25 PM      "

## 2024-02-22 ENCOUNTER — Ambulatory Visit: Admitting: Physical Therapy

## 2024-02-29 ENCOUNTER — Ambulatory Visit: Payer: Self-pay | Admitting: Physical Therapy

## 2024-03-06 ENCOUNTER — Ambulatory Visit: Payer: Self-pay | Admitting: Nurse Practitioner

## 2024-03-07 ENCOUNTER — Ambulatory Visit: Payer: Self-pay | Attending: Chiropractic Medicine | Admitting: Physical Therapy

## 2024-04-26 ENCOUNTER — Ambulatory Visit: Payer: Medicare HMO

## 2024-04-26 ENCOUNTER — Ambulatory Visit

## 2024-05-03 ENCOUNTER — Ambulatory Visit: Payer: Self-pay

## 2024-09-04 ENCOUNTER — Encounter: Payer: Self-pay | Admitting: Nurse Practitioner
